# Patient Record
Sex: Male | Born: 1970 | Race: Black or African American | Hispanic: No | Marital: Single | Smoking: Former smoker
Health system: Southern US, Community
[De-identification: ages and names within clinical notes are randomized; demographics above are authoritative.]

## PROBLEM LIST (undated history)

## (undated) DIAGNOSIS — I472 Ventricular tachycardia, unspecified: Secondary | ICD-10-CM

## (undated) DIAGNOSIS — E785 Hyperlipidemia, unspecified: Secondary | ICD-10-CM

## (undated) DIAGNOSIS — F419 Anxiety disorder, unspecified: Secondary | ICD-10-CM

## (undated) DIAGNOSIS — I25810 Atherosclerosis of coronary artery bypass graft(s) without angina pectoris: Secondary | ICD-10-CM

## (undated) DIAGNOSIS — Z720 Tobacco use: Secondary | ICD-10-CM

## (undated) DIAGNOSIS — E119 Type 2 diabetes mellitus without complications: Secondary | ICD-10-CM

## (undated) DIAGNOSIS — I255 Ischemic cardiomyopathy: Secondary | ICD-10-CM

## (undated) HISTORY — DX: Hyperlipidemia, unspecified: E78.5

## (undated) HISTORY — DX: Anxiety disorder, unspecified: F41.9

## (undated) HISTORY — DX: Ventricular tachycardia: I47.2

## (undated) HISTORY — DX: Type 2 diabetes mellitus without complications: E11.9

## (undated) HISTORY — DX: Ischemic cardiomyopathy: I25.5

## (undated) HISTORY — DX: Ventricular tachycardia, unspecified: I47.20

## (undated) HISTORY — PX: SHOULDER SURGERY: SHX246

## (undated) HISTORY — DX: Atherosclerosis of coronary artery bypass graft(s) without angina pectoris: I25.810

## (undated) HISTORY — PX: WRIST ARTHROPLASTY: SHX1088

---

## 2004-09-02 ENCOUNTER — Emergency Department (HOSPITAL_COMMUNITY): Admission: EM | Admit: 2004-09-02 | Discharge: 2004-09-02 | Payer: Self-pay | Admitting: Family Medicine

## 2006-11-12 ENCOUNTER — Emergency Department (HOSPITAL_COMMUNITY): Admission: EM | Admit: 2006-11-12 | Discharge: 2006-11-13 | Payer: Self-pay | Admitting: Emergency Medicine

## 2006-11-16 ENCOUNTER — Encounter: Admission: RE | Admit: 2006-11-16 | Discharge: 2006-11-16 | Payer: Self-pay | Admitting: Orthopedic Surgery

## 2006-11-25 ENCOUNTER — Encounter: Admission: RE | Admit: 2006-11-25 | Discharge: 2007-02-23 | Payer: Self-pay | Admitting: Orthopedic Surgery

## 2015-08-25 ENCOUNTER — Other Ambulatory Visit: Payer: Self-pay | Admitting: Family Medicine

## 2015-08-25 ENCOUNTER — Ambulatory Visit
Admission: RE | Admit: 2015-08-25 | Discharge: 2015-08-25 | Disposition: A | Discharge status: Home / Self Care | Payer: Worker's Compensation | Source: Ambulatory Visit | Attending: Family Medicine | Admitting: Family Medicine

## 2015-08-25 DIAGNOSIS — M25531 Pain in right wrist: Secondary | ICD-10-CM

## 2018-01-03 ENCOUNTER — Inpatient Hospital Stay (HOSPITAL_COMMUNITY)
Admission: EM | Admit: 2018-01-03 | Discharge: 2018-01-16 | DRG: 233 | Disposition: A | Discharge status: Home Health | Payer: Self-pay | Attending: Thoracic Surgery (Cardiothoracic Vascular Surgery) | Admitting: Thoracic Surgery (Cardiothoracic Vascular Surgery)

## 2018-01-03 ENCOUNTER — Inpatient Hospital Stay (HOSPITAL_COMMUNITY)
Admission: EM | Disposition: A | Discharge status: Home Health | Payer: Self-pay | Source: Home / Self Care | Attending: Thoracic Surgery (Cardiothoracic Vascular Surgery)

## 2018-01-03 ENCOUNTER — Other Ambulatory Visit: Payer: Self-pay | Admitting: *Deleted

## 2018-01-03 ENCOUNTER — Other Ambulatory Visit: Payer: Self-pay

## 2018-01-03 ENCOUNTER — Encounter (HOSPITAL_COMMUNITY): Payer: Self-pay | Admitting: *Deleted

## 2018-01-03 ENCOUNTER — Emergency Department (HOSPITAL_COMMUNITY): Payer: Self-pay

## 2018-01-03 DIAGNOSIS — R509 Fever, unspecified: Secondary | ICD-10-CM | POA: Diagnosis present

## 2018-01-03 DIAGNOSIS — I5021 Acute systolic (congestive) heart failure: Secondary | ICD-10-CM | POA: Diagnosis not present

## 2018-01-03 DIAGNOSIS — R740 Nonspecific elevation of levels of transaminase and lactic acid dehydrogenase [LDH]: Secondary | ICD-10-CM | POA: Diagnosis present

## 2018-01-03 DIAGNOSIS — I214 Non-ST elevation (NSTEMI) myocardial infarction: Principal | ICD-10-CM | POA: Diagnosis present

## 2018-01-03 DIAGNOSIS — Z6832 Body mass index (BMI) 32.0-32.9, adult: Secondary | ICD-10-CM

## 2018-01-03 DIAGNOSIS — E876 Hypokalemia: Secondary | ICD-10-CM | POA: Diagnosis not present

## 2018-01-03 DIAGNOSIS — Z951 Presence of aortocoronary bypass graft: Secondary | ICD-10-CM

## 2018-01-03 DIAGNOSIS — D62 Acute posthemorrhagic anemia: Secondary | ICD-10-CM | POA: Diagnosis not present

## 2018-01-03 DIAGNOSIS — I25110 Atherosclerotic heart disease of native coronary artery with unstable angina pectoris: Secondary | ICD-10-CM

## 2018-01-03 DIAGNOSIS — I2510 Atherosclerotic heart disease of native coronary artery without angina pectoris: Secondary | ICD-10-CM | POA: Insufficient documentation

## 2018-01-03 DIAGNOSIS — E785 Hyperlipidemia, unspecified: Secondary | ICD-10-CM | POA: Diagnosis present

## 2018-01-03 DIAGNOSIS — F17290 Nicotine dependence, other tobacco product, uncomplicated: Secondary | ICD-10-CM | POA: Diagnosis present

## 2018-01-03 DIAGNOSIS — I4901 Ventricular fibrillation: Secondary | ICD-10-CM | POA: Diagnosis not present

## 2018-01-03 DIAGNOSIS — Z96631 Presence of right artificial wrist joint: Secondary | ICD-10-CM | POA: Diagnosis present

## 2018-01-03 DIAGNOSIS — E669 Obesity, unspecified: Secondary | ICD-10-CM | POA: Diagnosis present

## 2018-01-03 DIAGNOSIS — I255 Ischemic cardiomyopathy: Secondary | ICD-10-CM | POA: Diagnosis present

## 2018-01-03 DIAGNOSIS — Z823 Family history of stroke: Secondary | ICD-10-CM

## 2018-01-03 DIAGNOSIS — E1165 Type 2 diabetes mellitus with hyperglycemia: Secondary | ICD-10-CM | POA: Diagnosis present

## 2018-01-03 DIAGNOSIS — I462 Cardiac arrest due to underlying cardiac condition: Secondary | ICD-10-CM | POA: Diagnosis not present

## 2018-01-03 DIAGNOSIS — I4510 Unspecified right bundle-branch block: Secondary | ICD-10-CM | POA: Diagnosis not present

## 2018-01-03 DIAGNOSIS — Z09 Encounter for follow-up examination after completed treatment for conditions other than malignant neoplasm: Secondary | ICD-10-CM

## 2018-01-03 HISTORY — PX: LEFT HEART CATH AND CORONARY ANGIOGRAPHY: CATH118249

## 2018-01-03 HISTORY — DX: Tobacco use: Z72.0

## 2018-01-03 LAB — TROPONIN I
TROPONIN I: 10.23 ng/mL — AB (ref <0.03)
Troponin I: 53.62 ng/mL (ref <0.03)

## 2018-01-03 LAB — COMPREHENSIVE METABOLIC PANEL
ALT: 35 U/L (ref 17–63)
AST: 67 U/L — ABNORMAL HIGH (ref 15–41)
Albumin: 3.6 g/dL (ref 3.5–5.0)
Alkaline Phosphatase: 107 U/L (ref 38–126)
Anion gap: 10 (ref 5–15)
BUN: 8 mg/dL (ref 6–20)
CO2: 22 mmol/L (ref 22–32)
Calcium: 8.7 mg/dL — ABNORMAL LOW (ref 8.9–10.3)
Chloride: 101 mmol/L (ref 101–111)
Creatinine, Ser: 0.86 mg/dL (ref 0.61–1.24)
GFR calc Af Amer: >60 mL/min (ref >60)
GFR calc non Af Amer: >60 mL/min (ref >60)
Glucose, Bld: 343 mg/dL — ABNORMAL HIGH (ref 65–99)
Potassium: 4 mmol/L (ref 3.5–5.1)
Sodium: 133 mmol/L — ABNORMAL LOW (ref 135–145)
Total Bilirubin: 0.8 mg/dL (ref 0.3–1.2)
Total Protein: 6.7 g/dL (ref 6.5–8.1)

## 2018-01-03 LAB — HEMOGLOBIN A1C
Hgb A1c MFr Bld: 12.7 % — ABNORMAL HIGH (ref 4.8–5.6)
Mean Plasma Glucose: 317.79 mg/dL

## 2018-01-03 LAB — CBC
HCT: 40.5 % (ref 39.0–52.0)
Hemoglobin: 13.5 g/dL (ref 13.0–17.0)
MCH: 26.1 pg (ref 26.0–34.0)
MCHC: 33.3 g/dL (ref 30.0–36.0)
MCV: 78.3 fL (ref 78.0–100.0)
Platelets: 215 10*3/uL (ref 150–400)
RBC: 5.17 MIL/uL (ref 4.22–5.81)
RDW: 13.3 % (ref 11.5–15.5)
WBC: 4.7 10*3/uL (ref 4.0–10.5)

## 2018-01-03 LAB — TSH: TSH: 0.867 u[IU]/mL (ref 0.350–4.500)

## 2018-01-03 LAB — MRSA PCR SCREENING: MRSA by PCR: NEGATIVE

## 2018-01-03 LAB — GLUCOSE, CAPILLARY
Glucose-Capillary: 279 mg/dL — ABNORMAL HIGH (ref 65–99)
Glucose-Capillary: 280 mg/dL — ABNORMAL HIGH (ref 65–99)

## 2018-01-03 LAB — I-STAT TROPONIN, ED: Troponin i, poc: 3.13 ng/mL (ref 0.00–0.08)

## 2018-01-03 LAB — CBG MONITORING, ED: Glucose-Capillary: 343 mg/dL — ABNORMAL HIGH (ref 65–99)

## 2018-01-03 LAB — PROTIME-INR
INR: 1.15
PROTHROMBIN TIME: 14.6 s (ref 11.4–15.2)

## 2018-01-03 SURGERY — LEFT HEART CATH AND CORONARY ANGIOGRAPHY
Anesthesia: LOCAL

## 2018-01-03 MED ORDER — SODIUM CHLORIDE 0.9 % IV BOLUS
1000.0000 mL | Freq: Once | INTRAVENOUS | Status: AC
  Administered 2018-01-03: 1000 mL via INTRAVENOUS

## 2018-01-03 MED ORDER — LIDOCAINE HCL (PF) 1 % IJ SOLN
INTRAMUSCULAR | Status: DC | PRN
  Administered 2018-01-03: 2 mL via INTRADERMAL

## 2018-01-03 MED ORDER — INSULIN ASPART 100 UNIT/ML ~~LOC~~ SOLN
0.0000 [IU] | Freq: Three times a day (TID) | SUBCUTANEOUS | Status: DC
  Administered 2018-01-03 – 2018-01-04 (×2): 5 [IU] via SUBCUTANEOUS
  Administered 2018-01-04: 3 [IU] via SUBCUTANEOUS
  Administered 2018-01-04: 5 [IU] via SUBCUTANEOUS
  Administered 2018-01-05 (×2): 3 [IU] via SUBCUTANEOUS
  Administered 2018-01-05: 2 [IU] via SUBCUTANEOUS
  Administered 2018-01-06: 3 [IU] via SUBCUTANEOUS
  Administered 2018-01-06 (×2): 2 [IU] via SUBCUTANEOUS
  Administered 2018-01-07: 1 [IU] via SUBCUTANEOUS
  Administered 2018-01-07: 3 [IU] via SUBCUTANEOUS
  Administered 2018-01-07: 2 [IU] via SUBCUTANEOUS

## 2018-01-03 MED ORDER — METOPROLOL TARTRATE 12.5 MG HALF TABLET
12.5000 mg | ORAL_TABLET | Freq: Two times a day (BID) | ORAL | Status: DC
  Administered 2018-01-03 – 2018-01-04 (×2): 12.5 mg via ORAL
  Filled 2018-01-03 (×2): qty 1

## 2018-01-03 MED ORDER — NITROGLYCERIN 0.4 MG SL SUBL
0.4000 mg | SUBLINGUAL_TABLET | SUBLINGUAL | Status: DC | PRN
  Administered 2018-01-03: 0.4 mg via SUBLINGUAL
  Filled 2018-01-03: qty 1

## 2018-01-03 MED ORDER — SODIUM CHLORIDE 0.9% FLUSH: 3.0000 mL | Freq: Two times a day (BID) | INTRAVENOUS | Status: DC

## 2018-01-03 MED ORDER — ONDANSETRON HCL 4 MG/2ML IJ SOLN: 4.0000 mg | Freq: Four times a day (QID) | INTRAMUSCULAR | Status: DC | PRN

## 2018-01-03 MED ORDER — THE SENSUOUS HEART BOOK
Freq: Once | Status: AC
  Administered 2018-01-03: 1
  Filled 2018-01-03: qty 1

## 2018-01-03 MED ORDER — SODIUM CHLORIDE 0.9 % IV SOLN: 250.0000 mL | INTRAVENOUS | Status: DC | PRN

## 2018-01-03 MED ORDER — HEPARIN SODIUM (PORCINE) 1000 UNIT/ML IJ SOLN
INTRAMUSCULAR | Status: AC
Start: 2018-01-03 — End: 2018-01-03
  Filled 2018-01-03: qty 1

## 2018-01-03 MED ORDER — SODIUM CHLORIDE 0.9% FLUSH: 3.0000 mL | INTRAVENOUS | Status: DC | PRN

## 2018-01-03 MED ORDER — IOPAMIDOL (ISOVUE-370) INJECTION 76%
INTRAVENOUS | Status: AC
  Filled 2018-01-03: qty 100

## 2018-01-03 MED ORDER — VERAPAMIL HCL 2.5 MG/ML IV SOLN
INTRAVENOUS | Status: AC
  Filled 2018-01-03: qty 2

## 2018-01-03 MED ORDER — INSULIN ASPART 100 UNIT/ML ~~LOC~~ SOLN: 0.0000 [IU] | Freq: Three times a day (TID) | SUBCUTANEOUS | Status: DC

## 2018-01-03 MED ORDER — NITROGLYCERIN IN D5W 200-5 MCG/ML-% IV SOLN
INTRAVENOUS | Status: AC
  Filled 2018-01-03: qty 250

## 2018-01-03 MED ORDER — FENTANYL CITRATE (PF) 100 MCG/2ML IJ SOLN
INTRAMUSCULAR | Status: DC | PRN
  Administered 2018-01-03: 25 ug via INTRAVENOUS

## 2018-01-03 MED ORDER — INSULIN ASPART 100 UNIT/ML ~~LOC~~ SOLN
0.0000 [IU] | Freq: Every day | SUBCUTANEOUS | Status: DC
  Administered 2018-01-03: 3 [IU] via SUBCUTANEOUS
  Administered 2018-01-04 – 2018-01-06 (×2): 2 [IU] via SUBCUTANEOUS

## 2018-01-03 MED ORDER — HEPARIN SODIUM (PORCINE) 1000 UNIT/ML IJ SOLN
INTRAMUSCULAR | Status: DC | PRN
  Administered 2018-01-03: 5000 [IU] via INTRAVENOUS

## 2018-01-03 MED ORDER — IOPAMIDOL (ISOVUE-370) INJECTION 76%
INTRAVENOUS | Status: DC | PRN
  Administered 2018-01-03: 85 mL via INTRA_ARTERIAL

## 2018-01-03 MED ORDER — ASPIRIN 81 MG PO CHEW
81.0000 mg | CHEWABLE_TABLET | Freq: Every day | ORAL | Status: DC
  Administered 2018-01-04 – 2018-01-07 (×4): 81 mg via ORAL
  Filled 2018-01-03 (×4): qty 1

## 2018-01-03 MED ORDER — SODIUM CHLORIDE 0.9% FLUSH
3.0000 mL | Freq: Two times a day (BID) | INTRAVENOUS | Status: DC
  Administered 2018-01-03 – 2018-01-07 (×6): 3 mL via INTRAVENOUS

## 2018-01-03 MED ORDER — HEPARIN (PORCINE) IN NACL 100-0.45 UNIT/ML-% IJ SOLN
1350.0000 [IU]/h | INTRAMUSCULAR | Status: DC
  Administered 2018-01-03: 1350 [IU]/h via INTRAVENOUS
  Filled 2018-01-03: qty 250

## 2018-01-03 MED ORDER — ASPIRIN EC 81 MG PO TBEC: 81.0000 mg | DELAYED_RELEASE_TABLET | Freq: Every day | ORAL | Status: DC

## 2018-01-03 MED ORDER — VERAPAMIL HCL 2.5 MG/ML IV SOLN
INTRAVENOUS | Status: DC | PRN
  Administered 2018-01-03: 10 mL via INTRA_ARTERIAL

## 2018-01-03 MED ORDER — MIDAZOLAM HCL 2 MG/2ML IJ SOLN
INTRAMUSCULAR | Status: AC
  Filled 2018-01-03: qty 2

## 2018-01-03 MED ORDER — ACETAMINOPHEN 325 MG PO TABS: 650.0000 mg | ORAL_TABLET | ORAL | Status: DC | PRN

## 2018-01-03 MED ORDER — HEPARIN (PORCINE) IN NACL 1000-0.9 UT/500ML-% IV SOLN
INTRAVENOUS | Status: AC
  Filled 2018-01-03: qty 1000

## 2018-01-03 MED ORDER — HEART ATTACK BOUNCING BOOK
Freq: Once | Status: AC
Start: 2018-01-03 — End: 2018-01-03
  Administered 2018-01-03: 1
  Filled 2018-01-03: qty 1

## 2018-01-03 MED ORDER — LIDOCAINE HCL (PF) 1 % IJ SOLN
INTRAMUSCULAR | Status: AC
  Filled 2018-01-03: qty 30

## 2018-01-03 MED ORDER — NITROGLYCERIN IN D5W 200-5 MCG/ML-% IV SOLN
0.0000 ug/min | INTRAVENOUS | Status: DC
  Administered 2018-01-03: 5 ug/min via INTRAVENOUS

## 2018-01-03 MED ORDER — MIDAZOLAM HCL 2 MG/2ML IJ SOLN
INTRAMUSCULAR | Status: DC | PRN
  Administered 2018-01-03: 2 mg via INTRAVENOUS

## 2018-01-03 MED ORDER — SODIUM CHLORIDE 0.9 % IV SOLN: INTRAVENOUS | Status: DC

## 2018-01-03 MED ORDER — HEPARIN (PORCINE) IN NACL 2-0.9 UNITS/ML
INTRAMUSCULAR | Status: AC | PRN
  Administered 2018-01-03 (×2): 500 mL via INTRA_ARTERIAL

## 2018-01-03 MED ORDER — HEPARIN (PORCINE) IN NACL 100-0.45 UNIT/ML-% IJ SOLN
1650.0000 [IU]/h | INTRAMUSCULAR | Status: DC
  Administered 2018-01-03: 1350 [IU]/h via INTRAVENOUS
  Administered 2018-01-04: 1650 [IU]/h via INTRAVENOUS
  Filled 2018-01-03: qty 250

## 2018-01-03 MED ORDER — HEPARIN BOLUS VIA INFUSION
4000.0000 [IU] | Freq: Once | INTRAVENOUS | Status: AC
  Administered 2018-01-03: 4000 [IU] via INTRAVENOUS
  Filled 2018-01-03: qty 4000

## 2018-01-03 MED ORDER — ACETAMINOPHEN 325 MG PO TABS
650.0000 mg | ORAL_TABLET | ORAL | Status: DC | PRN
  Administered 2018-01-04 – 2018-01-07 (×4): 650 mg via ORAL
  Filled 2018-01-03 (×4): qty 2

## 2018-01-03 MED ORDER — FENTANYL CITRATE (PF) 100 MCG/2ML IJ SOLN
INTRAMUSCULAR | Status: AC
  Filled 2018-01-03: qty 2

## 2018-01-03 SURGICAL SUPPLY — 12 items

## 2018-01-03 NOTE — Interval H&P Note (Signed)
Cath Lab Visit (complete for each Cath Lab visit)  Clinical Evaluation Leading to the Procedure:   ACS: Yes.    Non-ACS:    Anginal Classification: CCS IV  Anti-ischemic medical therapy: Minimal Therapy (1 class of medications)  Non-Invasive Test Results: No non-invasive testing performed  Prior CABG: No previous CABG      History and Physical Interval Note:  01/03/2018 3:58 PM  Allen Cox  has presented today for surgery, with the diagnosis of Nstemi  The various methods of treatment have been discussed with the patient and family. After consideration of risks, benefits and other options for treatment, the patient has consented to  Procedure(s): LEFT HEART CATH AND CORONARY ANGIOGRAPHY (N/A) as a surgical intervention .  The patient's history has been reviewed, patient examined, no change in status, stable for surgery.  I have reviewed the patient's chart and labs.  Questions were answered to the patient's satisfaction.     Lance Muss

## 2018-01-03 NOTE — Progress Notes (Signed)
ANTICOAGULATION CONSULT NOTE - Initial Consult  Pharmacy Consult for Heparin  Indication: chest pain/ACS  No Known Allergies  Patient Measurements: Height: 6' (182.9 cm) Weight: 240 lb (108.9 kg) IBW/kg (Calculated) : 77.6 Heparin Dosing Weight: 104  Vital Signs: Temp: 98.5 F (36.9 C) (05/03 1649) Temp Source: Oral (05/03 1649) BP: 99/67 (05/03 1625) Pulse Rate: 83 (05/03 1649)  Labs: Recent Labs    01/03/18 1341  HGB 13.5  HCT 40.5  PLT 215  CREATININE 0.86    Estimated Creatinine Clearance: 133.9 mL/min (by C-G formula based on SCr of 0.86 mg/dL).   Assessment: 47 yo male admitted for Chest pain. Elevated initial troponin now status post LHC  Sheath removal ~ 1630  Cbc stable  To resume heparin 8 hrs after pull No levels on previous heparin before stopping for cath  Goal of Therapy:  Heparin level 0.3-0.7 units/ml Monitor platelets by anticoagulation protocol: Yes   Plan:  Resume heparin 1350 units/hr @ 0030 Sat AM Next hep lvl 0630 Daily hep lvl cbc  Isaac Bliss, PharmD, BCPS, BCCCP Clinical Pharmacist Clinical phone for 01/03/2018 from 1430 [phone]: [phone] If after 2300, please call main pharmacy at: [phone] 01/03/2018 4:51 PM

## 2018-01-03 NOTE — ED Notes (Signed)
Cardiology at bedside.

## 2018-01-03 NOTE — ED Notes (Signed)
Pt rcvd 324 mg ASA pta 
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
## 2018-01-03 NOTE — Progress Notes (Signed)
ANTICOAGULATION CONSULT NOTE - Initial Consult  Pharmacy Consult for Heparin  Indication: chest pain/ACS  No Known Allergies  Patient Measurements: Weight: 240 lb (108.9 kg) Heparin Dosing Weight: 104  Vital Signs: Temp: 98.5 F (36.9 C) (05/03 1230) Temp Source: Oral (05/03 1230) BP: 101/74 (05/03 1231) Pulse Rate: 83 (05/03 1230)  Labs: Recent Labs    01/03/18 1341  HGB 13.5  HCT 40.5  PLT 215    CrCl cannot be calculated (No order found.).   Medical History: History reviewed. No pertinent past medical history.  Assessment: 47 yo male admitted for Chest pain. Elevated initial troponin. Starting heparin therapy.  Goal of Therapy:  Heparin level 0.3-0.7 units/ml Monitor platelets by anticoagulation protocol: Yes   Plan:  Give 4000 units bolus x 1 Start heparin infusion at 1350 units/hr Check anti-Xa level in 6 hours and daily while on heparin Continue to monitor H&H and platelets  Tera Mater, PharmD 01/03/2018,2:05 PM

## 2018-01-03 NOTE — Significant Event (Signed)
Cardiology Cross Cover Note  Called to assess patient given recurrent chest pain.  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  Repeat EKG shows continued anteroseptal Q waves with evolving anterior MI changes. After a sublingual nitroglycerin his pain resolved largely.  Phone and later returned at 50.  He appears euvolemic on exam is laying flat and appears comfortable in bed.  I discussed his case with interventional on-call. Our plan moving forward will be low-dose nitroglycerin systemic infusion, restarting systemic acute coronary syndrome heparin infusion, and very low-dose oral beta-blocker given heart rate over 100.  Should he worsen clinically we will have a very low threshold to place a balloon pump.  Cleta Alberts, MD 01/03/2018, 11:27 PM
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
## 2018-01-03 NOTE — Progress Notes (Signed)
Pt c/o chest discomfort 3/10. V/S stable. EKG done. Dr Means informed. Ntg 0.4 mg SL x1dose given as PRN dose. Rechecked after 5 mins. Pt verbalized chest pain free. Will continue to monitor pt.

## 2018-01-03 NOTE — Progress Notes (Signed)
Patient admitted to 6E05 from Cath Lab via bed.  Bed in low position, wheels locked.  Patient denies chest pain/shortness of breath.  Portable telemetry monitor applied.  Patient oriented to environment, including call bell, TV, meal times, and hourly rounding. TR band to right radial with 10cc of air.

## 2018-01-04 ENCOUNTER — Inpatient Hospital Stay (HOSPITAL_COMMUNITY): Payer: Self-pay

## 2018-01-04 DIAGNOSIS — I340 Nonrheumatic mitral (valve) insufficiency: Secondary | ICD-10-CM

## 2018-01-04 DIAGNOSIS — Z01810 Encounter for preprocedural cardiovascular examination: Secondary | ICD-10-CM

## 2018-01-04 LAB — BASIC METABOLIC PANEL
ANION GAP: 10 (ref 5–15)
BUN: 5 mg/dL — ABNORMAL LOW (ref 6–20)
CHLORIDE: 102 mmol/L (ref 101–111)
CO2: 21 mmol/L — AB (ref 22–32)
Calcium: 8.7 mg/dL — ABNORMAL LOW (ref 8.9–10.3)
Creatinine, Ser: 0.89 mg/dL (ref 0.61–1.24)
GFR calc Af Amer: >60 mL/min (ref >60)
GFR calc non Af Amer: >60 mL/min (ref >60)
GLUCOSE: 334 mg/dL — AB (ref 65–99)
Potassium: 3.6 mmol/L (ref 3.5–5.1)
Sodium: 133 mmol/L — ABNORMAL LOW (ref 135–145)

## 2018-01-04 LAB — HIV ANTIBODY (ROUTINE TESTING W REFLEX): HIV SCREEN 4TH GENERATION: NONREACTIVE

## 2018-01-04 LAB — HEPARIN LEVEL (UNFRACTIONATED): Heparin Unfractionated: 0.21 IU/mL — ABNORMAL LOW (ref 0.30–0.70)

## 2018-01-04 LAB — HEPATIC FUNCTION PANEL
ALT: 54 U/L (ref 17–63)
AST: 191 U/L — ABNORMAL HIGH (ref 15–41)
Albumin: 3.6 g/dL (ref 3.5–5.0)
Alkaline Phosphatase: 102 U/L (ref 38–126)
BILIRUBIN DIRECT: 0.1 mg/dL (ref 0.1–0.5)
BILIRUBIN INDIRECT: 0.7 mg/dL (ref 0.3–0.9)
TOTAL PROTEIN: 6.9 g/dL (ref 6.5–8.1)
Total Bilirubin: 0.8 mg/dL (ref 0.3–1.2)

## 2018-01-04 LAB — TROPONIN I: TROPONIN I: 54.25 ng/mL — AB (ref <0.03)

## 2018-01-04 LAB — CBC
HEMATOCRIT: 40.4 % (ref 39.0–52.0)
HEMOGLOBIN: 13.5 g/dL (ref 13.0–17.0)
MCH: 26.2 pg (ref 26.0–34.0)
MCHC: 33.4 g/dL (ref 30.0–36.0)
MCV: 78.3 fL (ref 78.0–100.0)
Platelets: 229 10*3/uL (ref 150–400)
RBC: 5.16 MIL/uL (ref 4.22–5.81)
RDW: 13.5 % (ref 11.5–15.5)
WBC: 9.2 10*3/uL (ref 4.0–10.5)

## 2018-01-04 LAB — LIPID PANEL
CHOL/HDL RATIO: 5.6 ratio
Cholesterol: 214 mg/dL — ABNORMAL HIGH (ref 0–200)
HDL: 38 mg/dL — AB (ref >40)
LDL CALC: 157 mg/dL — AB (ref 0–99)
Triglycerides: 96 mg/dL (ref <150)
VLDL: 19 mg/dL (ref 0–40)

## 2018-01-04 LAB — ECHOCARDIOGRAM COMPLETE
Height: 72 in
WEIGHTICAEL: 3833.6 [oz_av]

## 2018-01-04 LAB — GLUCOSE, CAPILLARY
GLUCOSE-CAPILLARY: 292 mg/dL — AB (ref 65–99)
GLUCOSE-CAPILLARY: 243 mg/dL — AB (ref 65–99)
GLUCOSE-CAPILLARY: 227 mg/dL — AB (ref 65–99)
Glucose-Capillary: 254 mg/dL — ABNORMAL HIGH (ref 65–99)

## 2018-01-04 MED ORDER — LIVING WELL WITH DIABETES BOOK
Freq: Once | Status: AC
  Administered 2018-01-04: 10:00:00
  Filled 2018-01-04: qty 1

## 2018-01-04 MED ORDER — METOPROLOL TARTRATE 25 MG PO TABS
25.0000 mg | ORAL_TABLET | Freq: Two times a day (BID) | ORAL | Status: DC
  Administered 2018-01-05 – 2018-01-07 (×6): 25 mg via ORAL
  Filled 2018-01-04 (×7): qty 1

## 2018-01-04 MED ORDER — HEPARIN BOLUS VIA INFUSION
1500.0000 [IU] | Freq: Once | INTRAVENOUS | Status: AC
  Administered 2018-01-04: 1500 [IU] via INTRAVENOUS
  Filled 2018-01-04: qty 1500

## 2018-01-04 MED ORDER — HEPARIN (PORCINE) IN NACL 100-0.45 UNIT/ML-% IJ SOLN
2300.0000 [IU]/h | INTRAMUSCULAR | Status: DC
  Administered 2018-01-05: 2000 [IU]/h via INTRAVENOUS
  Administered 2018-01-06: 2300 [IU]/h via INTRAVENOUS
  Administered 2018-01-06: 2150 [IU]/h via INTRAVENOUS
  Administered 2018-01-07 – 2018-01-08 (×3): 2300 [IU]/h via INTRAVENOUS
  Filled 2018-01-04 (×7): qty 250

## 2018-01-04 MED ORDER — LIVING WELL WITH DIABETES BOOK
Freq: Once | Status: AC
  Filled 2018-01-04: qty 1

## 2018-01-04 MED ORDER — HEPARIN BOLUS VIA INFUSION
3000.0000 [IU] | Freq: Once | INTRAVENOUS | Status: AC
  Administered 2018-01-04: 3000 [IU] via INTRAVENOUS
  Filled 2018-01-04: qty 3000

## 2018-01-04 NOTE — Progress Notes (Signed)
Progress Note   Subjective   Doing well today.  Chest pain is currently 0/10.  No new concerns.  Inpatient Medications    Scheduled Meds: . aspirin  81 mg Oral Daily  . insulin aspart  0-5 Units Subcutaneous QHS  . insulin aspart  0-9 Units Subcutaneous TID WC  . metoprolol tartrate  25 mg Oral BID  . sodium chloride flush  3 mL Intravenous Q12H   Continuous Infusions: . sodium chloride    . heparin 1,650 Units/hr (01/04/18 1007)  . nitroGLYCERIN 5 mcg/min (01/04/18 0500)   PRN Meds: sodium chloride, acetaminophen, nitroGLYCERIN, ondansetron (ZOFRAN) IV, sodium chloride flush   Vital Signs    Vitals:   01/04/18 0404 01/04/18 0448 01/04/18 0755 01/04/18 0814  BP: 111/70  108/70   Pulse:   (!) 104 94  Resp:      Temp: (!) 100.6 F (38.1 C)  99.1 F (37.3 C)   TempSrc: Oral  Oral   SpO2: 99%  98%   Weight:  239 lb 9.6 oz (108.7 kg)    Height:        Intake/Output Summary (Last 24 hours) at 01/04/2018 1145 Last data filed at 01/04/2018 1007 Gross per 24 hour  Intake 826.48 ml  Output 600 ml  Net 226.48 ml   Filed Weights   01/03/18 1400 01/03/18 1519 01/04/18 0448  Weight: 240 lb (108.9 kg) 240 lb (108.9 kg) 239 lb 9.6 oz (108.7 kg)    Telemetry    Sinus tachycardia - Personally Reviewed  Physical Exam   GEN- The patient is well appearing, alert and oriented x 3 today.   Head- normocephalic, atraumatic Eyes-  Sclera clear, conjunctiva pink Ears- hearing intact Oropharynx- clear Neck- supple, Lungs- Clear to ausculation bilaterally, normal work of breathing Heart- Regular rate and rhythm  GI- soft, NT, ND, + BS Extremities- no clubbing, cyanosis, or edema  MS- no significant deformity or atrophy Skin- no rash or lesion Psych- euthymic mood, full affect Neuro- strength and sensation are intact   Labs    Chemistry Recent Labs  Lab 01/03/18 1341 01/04/18 0433  NA 133* 133*  K 4.0 3.6  CL 101 102  CO2 22 21*  GLUCOSE 343* 334*  BUN 8 5*    CREATININE 0.86 0.89  CALCIUM 8.7* 8.7*  PROT 6.7 6.9  ALBUMIN 3.6 3.6  AST 67* 191*  ALT 35 54  ALKPHOS 107 102  BILITOT 0.8 0.8  GFRNONAA >60 >60  GFRAA >60 >60  ANIONGAP 10 10     Hematology Recent Labs  Lab 01/03/18 1341 01/04/18 0433  WBC 4.7 9.2  RBC 5.17 5.16  HGB 13.5 13.5  HCT 40.5 40.4  MCV 78.3 78.3  MCH 26.1 26.2  MCHC 33.3 33.4  RDW 13.3 13.5  PLT 215 229    Cardiac Enzymes Recent Labs  Lab 01/03/18 1525 01/03/18 2153 01/04/18 0433  TROPONINI 10.23* 53.62* 54.25*    Recent Labs  Lab 01/03/18 1329  TROPIPOC 3.13*        Assessment & Plan    1.  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 Dr Dorris Fetch has planned for CABG on Wednesday. Will continue IV heparin and nitroglycerin for now. Titrate metoprolol as bp allows Echo pending  2. Diabetes Diabetes management team consulted  3. Overweight Body mass index is 32.5 kg/m. Lifestyle modification will be beneficial  4. Low grade fever Likely from MI No symptoms to localize Blood cultures have  been ordered  Hillis Range MD, [facility] 01/04/2018 11:45 AM

## 2018-01-04 NOTE — Progress Notes (Signed)
Pt c/o feeling warm. Took out some of patient's blanket. Rechecked Temp=101.8. Tylenol 650 mg po given. Dr Means informed & ordered urine culture & blood culture. Will monitor pt.

## 2018-01-04 NOTE — Progress Notes (Signed)
  Echocardiogram 2D Echocardiogram has been performed.  Allen Cox 01/04/2018, 11:18 AM

## 2018-01-04 NOTE — Progress Notes (Signed)
ANTICOAGULATION CONSULT NOTE   Pharmacy Consult for Heparin  Indication: chest pain/ACS  No Known Allergies  Patient Measurements: Height: 6' (182.9 cm) Weight: 239 lb 9.6 oz (108.7 kg) IBW/kg (Calculated) : 77.6 Heparin Dosing Weight: 100.5 kg  Vital Signs: Temp: 100.5 F (38.1 C) (05/04 1627) Temp Source: Oral (05/04 1627) BP: 102/65 (05/04 1627) Pulse Rate: 94 (05/04 0814)  Labs: Recent Labs    01/03/18 1341 01/03/18 1525 01/03/18 1610 01/03/18 2153 01/04/18 0433 01/04/18 0740 01/04/18 1533  HGB 13.5  --   --   --  13.5  --   --   HCT 40.5  --   --   --  40.4  --   --   PLT 215  --   --   --  229  --   --   LABPROT  --   --  14.6  --   --   --   --   INR  --   --  1.15  --   --   --   --   HEPARINUNFRC  --   --   --   --  <0.10* <0.10* 0.21*  CREATININE 0.86  --   --   --  0.89  --   --   TROPONINI  --  10.23*  --  53.62* 54.25*  --   --     Estimated Creatinine Clearance: 129.2 mL/min (by C-G formula based on SCr of 0.89 mg/dL).   Assessment: 47 yo male admitted for Chest pain. Elevated initial troponin now status post LHC. Heparin resumed 8 hours post sheath removal at 1350 units/hour.  Heparin level this morning was undetectable. Patient has 3V CAD  with plans for CABG on 5/8. CBC stable. Heparin rate was increased to 1650 units/hr and 3000 unit bolus given.  HL now = 0.21 on 1650 unit/hr No bleeding reported. No issues with IV site/infusion per RN.    Goal of Therapy:  Heparin level 0.3-0.7 units/ml Monitor platelets by anticoagulation protocol: Yes   Plan:  Heparin 1500 unit bolus x 1 Increase heparin gtt to 1850 units/hour Check heparin level in 6 hours Daily heparin level and CBC  Noah Delaine, RPh Clinical Pharmacist Pager: [phone] 8A-4P [phone] 4P-10P #[id number] Main Pharmacy [phone] 01/04/2018 4:58 PM

## 2018-01-04 NOTE — Progress Notes (Addendum)
ANTICOAGULATION CONSULT NOTE   Pharmacy Consult for Heparin  Indication: chest pain/ACS  No Known Allergies  Patient Measurements: Height: 6' (182.9 cm) Weight: 239 lb 9.6 oz (108.7 kg) IBW/kg (Calculated) : 77.6 Heparin Dosing Weight: 104 kg  Vital Signs: Temp: 99.1 F (37.3 C) (05/04 0755) Temp Source: Oral (05/04 0755) BP: 108/70 (05/04 0755) Pulse Rate: 94 (05/04 0814)  Labs: Recent Labs    01/03/18 1341 01/03/18 1525 01/03/18 1610 01/03/18 2153 01/04/18 0433 01/04/18 0740  HGB 13.5  --   --   --  13.5  --   HCT 40.5  --   --   --  40.4  --   PLT 215  --   --   --  229  --   LABPROT  --   --  14.6  --   --   --   INR  --   --  1.15  --   --   --   HEPARINUNFRC  --   --   --   --  <0.10* <0.10*  CREATININE 0.86  --   --   --  0.89  --   TROPONINI  --  10.23*  --  53.62* 54.25*  --     Estimated Creatinine Clearance: 129.2 mL/min (by C-G formula based on SCr of 0.89 mg/dL).   Assessment: 47 yo male admitted for Chest pain. Elevated initial troponin now status post LHC. Heparin resumed 8 hours post sheath removal at 1350 units/hour. No prior heparin levels drawn. Heparin level this morning was undetectable. Patient has mvdz with plans for CABG on 5/8.  CBC stable, no bleeding reported, no issues or interruptions to IV lines per RN.    Goal of Therapy:  Heparin level 0.3-0.7 units/ml Monitor platelets by anticoagulation protocol: Yes   Plan:  Heparin 3000 unit bolus x 1 Increase heparin gtt to 1650 units/hour Check heparin level in 6 hours Daily heparin level and CBC  Adline Potter, PharmD Pharmacy Resident Pager: [phone]

## 2018-01-04 NOTE — Progress Notes (Signed)
Pre-op Cardiac Surgery  Carotid Findings:  1-39% ICA plaquing.  Vertebral artery flow is antegrade.   Upper Extremity Right Left  Brachial Pressures 110T 105T  Radial Waveforms T T  Ulnar Waveforms T T  Palmar Arch (Allen's Test) WNL Doppler signal obliterates with both radial and ulnar compression        Lower  Extremity Right Left  Dorsalis Pedis T T  Anterior Tibial    Posterior Tibial T T  Ankle/Brachial Indices

## 2018-01-04 NOTE — Progress Notes (Signed)
Inpatient Diabetes Program Recommendations  AACE/ADA: New Consensus Statement on Inpatient Glycemic Control (2015)  Target Ranges:  Prepandial:   less than 140 mg/dL      Peak postprandial:   less than 180 mg/dL (1-2 hours)      Critically ill patients:  140 - 180 mg/dL   Lab Results  Component Value Date   GLUCAP 254 (H) 01/04/2018   HGBA1C 12.7 (H) 01/03/2018    Review of Glycemic ControlResults for MEET, [place] (MRN [id number]) as of 01/04/2018 15:51  Ref. Range 01/03/2018 12:38 01/03/2018 17:05 01/03/2018 21:30 01/04/2018 08:02 01/04/2018 11:40  Glucose-Capillary Latest Ref Range: 65 - 99 mg/dL 409 (H) 811 (H) 914 (H) 292 (H) 254 (H)    Diabetes history: New diagnosis of DM Outpatient Diabetes medications: None Current orders for Inpatient glycemic control:  Novolog sensitive tid with meals and HS  Inpatient Diabetes Program Recommendations:    Note referral.  Diabetes Coordinator not on site this weekend.  Will see patient on 01/06/18.  Will order survival skill education by bedside RN.   Please consider also adding basal insulin, Lantus 20 units daily while in the hospital.    Thanks,  Beryl Meager, RN, BC-ADM Inpatient Diabetes Coordinator Pager [phone] (8a-5p)

## 2018-01-05 LAB — GLUCOSE, CAPILLARY
GLUCOSE-CAPILLARY: 211 mg/dL — AB (ref 65–99)
Glucose-Capillary: 226 mg/dL — ABNORMAL HIGH (ref 65–99)
Glucose-Capillary: 200 mg/dL — ABNORMAL HIGH (ref 65–99)
Glucose-Capillary: 190 mg/dL — ABNORMAL HIGH (ref 65–99)

## 2018-01-05 LAB — CBC
HCT: 41.3 % (ref 39.0–52.0)
HEMOGLOBIN: 13.5 g/dL (ref 13.0–17.0)
MCH: 25.9 pg — AB (ref 26.0–34.0)
MCHC: 32.7 g/dL (ref 30.0–36.0)
MCV: 79.1 fL (ref 78.0–100.0)
PLATELETS: 215 10*3/uL (ref 150–400)
RBC: 5.22 MIL/uL (ref 4.22–5.81)
RDW: 13.5 % (ref 11.5–15.5)
WBC: 7.1 10*3/uL (ref 4.0–10.5)

## 2018-01-05 LAB — URINE CULTURE: Special Requests: NORMAL

## 2018-01-05 LAB — HEPARIN LEVEL (UNFRACTIONATED)
Heparin Unfractionated: 0.22 IU/mL — ABNORMAL LOW (ref 0.30–0.70)
Heparin Unfractionated: 0.28 IU/mL — ABNORMAL LOW (ref 0.30–0.70)
Heparin Unfractionated: 0.33 IU/mL (ref 0.30–0.70)
Heparin Unfractionated: 0.34 IU/mL (ref 0.30–0.70)

## 2018-01-05 MED ORDER — INSULIN GLARGINE 100 UNIT/ML ~~LOC~~ SOLN
20.0000 [IU] | Freq: Every day | SUBCUTANEOUS | Status: DC
  Administered 2018-01-05 – 2018-01-07 (×3): 20 [IU] via SUBCUTANEOUS
  Filled 2018-01-05 (×4): qty 0.2

## 2018-01-05 MED ORDER — HEPARIN BOLUS VIA INFUSION
1000.0000 [IU] | Freq: Once | INTRAVENOUS | Status: AC
  Administered 2018-01-05: 1000 [IU] via INTRAVENOUS
  Filled 2018-01-05: qty 1000

## 2018-01-05 NOTE — Progress Notes (Signed)
ANTICOAGULATION CONSULT NOTE   Pharmacy Consult for Heparin  Indication: chest pain/ACS  No Known Allergies  Patient Measurements: Height: 6' (182.9 cm) Weight: 237 lb 6.4 oz (107.7 kg) IBW/kg (Calculated) : 77.6 Heparin Dosing Weight: 100.5 kg  Vital Signs: Temp: 99 F (37.2 C) (05/05 0733) Temp Source: Oral (05/05 0733) BP: 119/78 (05/05 0733) Pulse Rate: 99 (05/05 0918)  Labs: Recent Labs    01/03/18 1341 01/03/18 1525 01/03/18 1610 01/03/18 2153 01/04/18 0433  01/04/18 1533 01/05/18 0049 01/05/18 0820  HGB 13.5  --   --   --  13.5  --   --   --  13.5  HCT 40.5  --   --   --  40.4  --   --   --  41.3  PLT 215  --   --   --  229  --   --   --  215  LABPROT  --   --  14.6  --   --   --   --   --   --   INR  --   --  1.15  --   --   --   --   --   --   HEPARINUNFRC  --   --   --   --  <0.10*   < > 0.21* 0.28* 0.22*  CREATININE 0.86  --   --   --  0.89  --   --   --   --   TROPONINI  --  10.23*  --  53.62* 54.25*  --   --   --   --    < > = values in this interval not displayed.    Estimated Creatinine Clearance: 128.6 mL/min (by C-G formula based on SCr of 0.89 mg/dL).   Assessment: 47 yo male admitted for Chest pain. Elevated initial troponin now status post LHC. Patient has 3V CAD with plans for CABG on 5/8.  CBC stable. Heparin level still subtherapeutic this AM at 0.22 despite increases in heaprin dose/boluses. However, RN noticed a little kink in the IV line near the patient's infusion site which may/may not have impacted the infusion. She fixed and flushed the line so no further issues. Given the slow elevation in heparin level even when the IV line was functioning properly, will give a small bolus of heparin and increase the rate slightly to help reach heparin level goal.   Goal of Therapy:  Heparin level 0.3-0.7 units/ml Monitor platelets by anticoagulation protocol: Yes   Plan:  Heparin 1000 unit bolus x 1 Increase heparin gtt to 2100 units/hour Check  heparin level in 6 hours Daily heparin level and CBC  Adline Potter, PharmD Pharmacy Resident Pager: [phone]

## 2018-01-05 NOTE — Progress Notes (Signed)
      [address]       [place] [address]             [phone]      No CP or SOB  BP 119/78 (BP Location: Right Arm)   Pulse 99   Temp 99 F (37.2 C) (Oral)   Resp 20   Ht 6' (1.829 m)   Wt 237 lb 6.4 oz (107.7 kg)   SpO2 96%   BMI 32.20 kg/m   Exam A&O x 3 Cardiac RRR  Severe 3 VD s/p NSTEMI  He asked about alternatives to surgery. Discussed results of medical therapy v surgery in this setting  Viviann Spare C. Dorris Fetch, MD [facility] [phone]

## 2018-01-05 NOTE — Progress Notes (Signed)
ANTICOAGULATION CONSULT NOTE   Pharmacy Consult for Heparin  Indication: chest pain/ACS  No Known Allergies  Patient Measurements: Height: 6' (182.9 cm) Weight: 237 lb 6.4 oz (107.7 kg) IBW/kg (Calculated) : 77.6 Heparin Dosing Weight: 100.5 kg  Vital Signs: Temp: 99.5 F (37.5 C) (05/05 1547) Temp Source: Oral (05/05 1547) BP: 112/75 (05/05 2033) Pulse Rate: 101 (05/05 2033)  Labs: Recent Labs    01/03/18 1341 01/03/18 1525 01/03/18 1610 01/03/18 2153 01/04/18 0433  01/05/18 0820 01/05/18 1622 01/05/18 2314  HGB 13.5  --   --   --  13.5  --  13.5  --   --   HCT 40.5  --   --   --  40.4  --  41.3  --   --   PLT 215  --   --   --  229  --  215  --   --   LABPROT  --   --  14.6  --   --   --   --   --   --   INR  --   --  1.15  --   --   --   --   --   --   HEPARINUNFRC  --   --   --   --  <0.10*   < > 0.22* 0.34 0.33  CREATININE 0.86  --   --   --  0.89  --   --   --   --   TROPONINI  --  10.23*  --  53.62* 54.25*  --   --   --   --    < > = values in this interval not displayed.    Estimated Creatinine Clearance: 128.6 mL/min (by C-G formula based on SCr of 0.89 mg/dL).   Assessment: 47 yo male admitted for Chest pain. Elevated initial troponin now status post LHC. Patient has 3V CAD with plans for CABG on 5/8.  Heparin level = 0.33 on heparin 2150 units/hr. No bleeding noted.    Goal of Therapy:  Heparin level 0.3-0.7 units/ml Monitor platelets by anticoagulation protocol: Yes   Plan:  Continue heparin gtt at 2150 units/hour Daily heparin level and CBC  Talbert Cage, PharmD Clinical Pharmacist Pager: [phone] 01/05/2018 11:56 PM

## 2018-01-05 NOTE — Progress Notes (Signed)
ANTICOAGULATION CONSULT NOTE   Pharmacy Consult for Heparin  Indication: chest pain/ACS  No Known Allergies  Patient Measurements: Height: 6' (182.9 cm) Weight: 237 lb 6.4 oz (107.7 kg) IBW/kg (Calculated) : 77.6 Heparin Dosing Weight: 100.5 kg  Vital Signs: Temp: 99.5 F (37.5 C) (05/05 1547) Temp Source: Oral (05/05 1547) BP: 107/61 (05/05 1547) Pulse Rate: 99 (05/05 1547)  Labs: Recent Labs    01/03/18 1341 01/03/18 1525 01/03/18 1610 01/03/18 2153 01/04/18 0433  01/05/18 0049 01/05/18 0820 01/05/18 1622  HGB 13.5  --   --   --  13.5  --   --  13.5  --   HCT 40.5  --   --   --  40.4  --   --  41.3  --   PLT 215  --   --   --  229  --   --  215  --   LABPROT  --   --  14.6  --   --   --   --   --   --   INR  --   --  1.15  --   --   --   --   --   --   HEPARINUNFRC  --   --   --   --  <0.10*   < > 0.28* 0.22* 0.34  CREATININE 0.86  --   --   --  0.89  --   --   --   --   TROPONINI  --  10.23*  --  53.62* 54.25*  --   --   --   --    < > = values in this interval not displayed.    Estimated Creatinine Clearance: 128.6 mL/min (by C-G formula based on SCr of 0.89 mg/dL).   Assessment: 47 yo male admitted for Chest pain. Elevated initial troponin now status post LHC. Patient has 3V CAD with plans for CABG on 5/8.  Heparin level = 0.34 on heparin 2100 units/hr. No bleeding noted.  Therapeutic heparin level x1. CBC stable.  Goal of Therapy:  Heparin level 0.3-0.7 units/ml Monitor platelets by anticoagulation protocol: Yes   Plan:  Increase heparin gtt to 2150 units/hour Check heparin level in 6 hours to confirm remains therapeutic x 2 then daily HL. Daily heparin level and CBC  Noah Delaine, RPh Clinical Pharmacist Pager: [phone] 01/05/2018 4:57 PM

## 2018-01-05 NOTE — Progress Notes (Signed)
ANTICOAGULATION CONSULT NOTE - Follow Up Consult  Pharmacy Consult for heparin Indication: CAD awaiting CABG  Labs: Recent Labs    01/03/18 1341 01/03/18 1525 01/03/18 1610 01/03/18 2153  01/04/18 0433 01/04/18 0740 01/04/18 1533 01/05/18 0049  HGB 13.5  --   --   --   --  13.5  --   --   --   HCT 40.5  --   --   --   --  40.4  --   --   --   PLT 215  --   --   --   --  229  --   --   --   LABPROT  --   --  14.6  --   --   --   --   --   --   INR  --   --  1.15  --   --   --   --   --   --   HEPARINUNFRC  --   --   --   --    < > <0.10* <0.10* 0.21* 0.28*  CREATININE 0.86  --   --   --   --  0.89  --   --   --   TROPONINI  --  10.23*  --  53.62*  --  54.25*  --   --   --    < > = values in this interval not displayed.    Assessment: 47yo male remains subtherapeutic on heparin after rate changes though approaching goal.  Goal of Therapy:  Heparin level 0.3-0.7 units/ml   Plan:  Will increase heparin gtt by 1-2 units/kg/hr to 2000 units/hr and check level in 6 hours.    Vernard Gambles, PharmD, BCPS  01/05/2018,2:15 AM

## 2018-01-05 NOTE — Progress Notes (Signed)
Progress Note   Subjective   Doing well today, the patient denies CP or SOB.  No new concerns  Inpatient Medications    Scheduled Meds: . aspirin  81 mg Oral Daily  . heparin  1,000 Units Intravenous Once  . insulin aspart  0-5 Units Subcutaneous QHS  . insulin aspart  0-9 Units Subcutaneous TID WC  . insulin glargine  20 Units Subcutaneous Daily  . metoprolol tartrate  25 mg Oral BID  . sodium chloride flush  3 mL Intravenous Q12H   Continuous Infusions: . sodium chloride    . heparin 2,000 Units/hr (01/05/18 0237)  . nitroGLYCERIN 5 mcg/min (01/05/18 0200)   PRN Meds: sodium chloride, acetaminophen, nitroGLYCERIN, ondansetron (ZOFRAN) IV, sodium chloride flush   Vital Signs    Vitals:   01/05/18 0447 01/05/18 0449 01/05/18 0733 01/05/18 0918  BP: 114/82  119/78   Pulse: (!) 109  (!) 102 99  Resp:   20   Temp:  99.7 F (37.6 C) 99 F (37.2 C)   TempSrc:  Oral Oral   SpO2: 96%  96%   Weight:  237 lb 6.4 oz (107.7 kg)    Height:        Intake/Output Summary (Last 24 hours) at 01/05/2018 1042 Last data filed at 01/05/2018 0200 Gross per 24 hour  Intake 470.34 ml  Output -  Net 470.34 ml   Filed Weights   01/03/18 1519 01/04/18 0448 01/05/18 0449  Weight: 240 lb (108.9 kg) 239 lb 9.6 oz (108.7 kg) 237 lb 6.4 oz (107.7 kg)    Telemetry    sinus - Personally Reviewed  Physical Exam   GEN- The patient is well appearing, alert and oriented x 3 today.   Head- normocephalic, atraumatic Eyes-  Sclera clear, conjunctiva pink Ears- hearing intact Oropharynx- clear Neck- supple, Lungs- Clear to ausculation bilaterally, normal work of breathing Heart- Regular rate and rhythm  GI- soft, NT, ND, + BS Extremities- no clubbing, cyanosis, or edema  MS- no significant deformity or atrophy Skin- no rash or lesion Psych- euthymic mood, full affect Neuro- strength and sensation are intact   Labs    Chemistry Recent Labs  Lab 01/03/18 1341 01/04/18 0433  NA  133* 133*  K 4.0 3.6  CL 101 102  CO2 22 21*  GLUCOSE 343* 334*  BUN 8 5*  CREATININE 0.86 0.89  CALCIUM 8.7* 8.7*  PROT 6.7 6.9  ALBUMIN 3.6 3.6  AST 67* 191*  ALT 35 54  ALKPHOS 107 102  BILITOT 0.8 0.8  GFRNONAA >60 >60  GFRAA >60 >60  ANIONGAP 10 10     Hematology Recent Labs  Lab 01/03/18 1341 01/04/18 0433 01/05/18 0820  WBC 4.7 9.2 7.1  RBC 5.17 5.16 5.22  HGB 13.5 13.5 13.5  HCT 40.5 40.4 41.3  MCV 78.3 78.3 79.1  MCH 26.1 26.2 25.9*  MCHC 33.3 33.4 32.7  RDW 13.3 13.5 13.5  PLT 215 229 215    Cardiac Enzymes Recent Labs  Lab 01/03/18 1525 01/03/18 2153 01/04/18 0433  TROPONINI 10.23* 53.62* 54.25*    Recent Labs  Lab 01/03/18 1329  TROPIPOC 3.13*    Echo 01/04/18- EF 30-35%, anterior, septal and paical AK, severe HK of the basal-mid inferolateral and apical inferior walls, mild to moderate MR     Assessment & Plan    1.  NSTEMI 3V CAD Planned for CABG on Wednesday Continue IV heparin and nitro titrate beta blocker again tomorrow Could consider LifeVest  at discharge  2. Diabetes Diabetes management consulted Will add lantus at this time 20u daily  3. Overweight Body mass index is 32.2 kg/m.  4. Low grade fever yesterday Improved WBC ok Blood cultures pending  Hillis Range MD, [facility] 01/05/2018 10:42 AM

## 2018-01-06 ENCOUNTER — Inpatient Hospital Stay (HOSPITAL_COMMUNITY): Payer: Self-pay

## 2018-01-06 ENCOUNTER — Encounter (HOSPITAL_COMMUNITY): Payer: Self-pay | Admitting: Interventional Cardiology

## 2018-01-06 DIAGNOSIS — E118 Type 2 diabetes mellitus with unspecified complications: Secondary | ICD-10-CM

## 2018-01-06 LAB — PULMONARY FUNCTION TEST
DL/VA % PRED: 122 %
DL/VA: 5.81 ml/min/mmHg/L
DLCO COR % PRED: 90 %
DLCO COR: 31.65 ml/min/mmHg
DLCO UNC % PRED: 84 %
DLCO UNC: 29.6 ml/min/mmHg
FEF 25-75 POST: 4.8 L/s
FEF 25-75 PRE: 3.37 L/s
FEF2575-%Change-Post: 42 %
FEF2575-%Pred-Post: 132 %
FEF2575-%Pred-Pre: 93 %
FEV1-%Change-Post: 5 %
FEV1-%Pred-Post: 95 %
FEV1-%Pred-Pre: 90 %
FEV1-Post: 3.48 L
FEV1-Pre: 3.29 L
FEV1FVC-%Change-Post: 10 %
FEV1FVC-%Pred-Pre: 101 %
FEV6-%CHANGE-POST: -4 %
FEV6-%PRED-POST: 88 %
FEV6-%PRED-PRE: 92 %
FEV6-PRE: 4.07 L
FEV6-Post: 3.9 L
FEV6FVC-%PRED-POST: 103 %
FEV6FVC-%PRED-PRE: 103 %
FVC-%Change-Post: -4 %
FVC-%PRED-POST: 85 %
FVC-%Pred-Pre: 89 %
FVC-Post: 3.9 L
FVC-Pre: 4.07 L
POST FEV6/FVC RATIO: 100 %
PRE FEV1/FVC RATIO: 81 %
Post FEV1/FVC ratio: 89 %
Pre FEV6/FVC Ratio: 100 %
RV % pred: 65 %
RV: 1.39 L
TLC % pred: 76 %
TLC: 5.64 L

## 2018-01-06 LAB — HEPARIN LEVEL (UNFRACTIONATED)
Heparin Unfractionated: 0.28 IU/mL — ABNORMAL LOW (ref 0.30–0.70)
Heparin Unfractionated: 0.45 IU/mL (ref 0.30–0.70)

## 2018-01-06 LAB — CBC
HEMATOCRIT: 38.1 % — AB (ref 39.0–52.0)
HEMOGLOBIN: 12.5 g/dL — AB (ref 13.0–17.0)
MCH: 25.7 pg — AB (ref 26.0–34.0)
MCHC: 32.8 g/dL (ref 30.0–36.0)
MCV: 78.4 fL (ref 78.0–100.0)
Platelets: 206 10*3/uL (ref 150–400)
RBC: 4.86 MIL/uL (ref 4.22–5.81)
RDW: 13.4 % (ref 11.5–15.5)
WBC: 6.3 10*3/uL (ref 4.0–10.5)

## 2018-01-06 LAB — GLUCOSE, CAPILLARY
GLUCOSE-CAPILLARY: 178 mg/dL — AB (ref 65–99)
GLUCOSE-CAPILLARY: 215 mg/dL — AB (ref 65–99)
GLUCOSE-CAPILLARY: 233 mg/dL — AB (ref 65–99)
Glucose-Capillary: 179 mg/dL — ABNORMAL HIGH (ref 65–99)

## 2018-01-06 MED ORDER — ATORVASTATIN CALCIUM 80 MG PO TABS: 80.0000 mg | ORAL_TABLET | Freq: Every day | ORAL | Status: DC

## 2018-01-06 MED ORDER — ATORVASTATIN CALCIUM 80 MG PO TABS
80.0000 mg | ORAL_TABLET | Freq: Every day | ORAL | Status: DC
  Administered 2018-01-06 – 2018-01-15 (×9): 80 mg via ORAL
  Filled 2018-01-06 (×9): qty 1

## 2018-01-06 MED ORDER — ALBUTEROL SULFATE (2.5 MG/3ML) 0.083% IN NEBU
2.5000 mg | INHALATION_SOLUTION | Freq: Once | RESPIRATORY_TRACT | Status: AC
  Administered 2018-01-06: 2.5 mg via RESPIRATORY_TRACT

## 2018-01-06 NOTE — Progress Notes (Addendum)
Progress Note  Patient Name: Allen Cox Date of Encounter: 01/06/2018  Primary Cardiologist: No primary care provider on file.  Subjective   Feeling well this morning. No chest pain.   Inpatient Medications    Scheduled Meds: . aspirin  81 mg Oral Daily  . insulin aspart  0-5 Units Subcutaneous QHS  . insulin aspart  0-9 Units Subcutaneous TID WC  . insulin glargine  20 Units Subcutaneous Daily  . metoprolol tartrate  25 mg Oral BID  . sodium chloride flush  3 mL Intravenous Q12H   Continuous Infusions: . sodium chloride    . heparin 2,150 Units/hr (01/06/18 0248)  . nitroGLYCERIN 5 mcg/min (01/05/18 0200)   PRN Meds: sodium chloride, acetaminophen, nitroGLYCERIN, ondansetron (ZOFRAN) IV, sodium chloride flush   Vital Signs    Vitals:   01/05/18 1547 01/05/18 2033 01/06/18 0001 01/06/18 0434  BP: 107/61 112/75 111/75 118/78  Pulse: 99 (!) 101 96 (!) 103  Resp:  Temp: 99.5 F (37.5 C)  99.8 F (37.7 C) 99.9 F (37.7 C)  TempSrc: Oral  Oral Oral  SpO2: 98% 98% 99% 97%  Weight:    238 lb 4.8 oz (108.1 kg)  Height:        Intake/Output Summary (Last 24 hours) at 01/06/2018 0851 Last data filed at 01/06/2018 0700 Gross per 24 hour  Intake 741 ml  Output 1025 ml  Net -284 ml   Filed Weights   01/04/18 0448 01/05/18 0449 01/06/18 0434  Weight: 239 lb 9.6 oz (108.7 kg) 237 lb 6.4 oz (107.7 kg) 238 lb 4.8 oz (108.1 kg)    Telemetry    SR - Personally Reviewed  ECG    N/a - Personally Reviewed  Physical Exam   General: Well developed, well nourished, male appearing in no acute distress. Head: Normocephalic, atraumatic.  Neck: Supple without bruits, JVD. Lungs:  Resp regular and unlabored, CTA. Heart: RRR, S1, S2, no S3, S4, or murmur; no rub. Abdomen: Soft, non-tender, non-distended with normoactive bowel sounds.  Extremities: No clubbing, cyanosis, edema. Distal pedal pulses are 2+ bilaterally. Neuro: Alert and oriented X 3. Moves all  extremities spontaneously. Psych: Normal affect.  Labs    Chemistry Recent Labs  Lab 01/03/18 1341 01/04/18 0433  NA 133* 133*  K 4.0 3.6  CL 101 102  CO2 22 21*  GLUCOSE 343* 334*  BUN 8 5*  CREATININE 0.86 0.89  CALCIUM 8.7* 8.7*  PROT 6.7 6.9  ALBUMIN 3.6 3.6  AST 67* 191*  ALT 35 54  ALKPHOS 107 102  BILITOT 0.8 0.8  GFRNONAA >60 >60  GFRAA >60 >60  ANIONGAP 10 10     Hematology Recent Labs  Lab 01/04/18 0433 01/05/18 0820 01/06/18 0336  WBC 9.2 7.1 6.3  RBC 5.16 5.22 4.86  HGB 13.5 13.5 12.5*  HCT 40.4 41.3 38.1*  MCV 78.3 79.1 78.4  MCH 26.2 25.9* 25.7*  MCHC 33.4 32.7 32.8  RDW 13.5 13.5 13.4  PLT 229 215 206    Cardiac Enzymes Recent Labs  Lab 01/03/18 1525 01/03/18 2153 01/04/18 0433  TROPONINI 10.23* 53.62* 54.25*    Recent Labs  Lab 01/03/18 1329  TROPIPOC 3.13*     BNPNo results for input(s): BNP, PROBNP in the last 168 hours.   DDimer No results for input(s): DDIMER in the last 168 hours.    Radiology    No results found.  Cardiac Studies   Cath: 01/03/18  Conclusion  Post Atrio lesion is 100% stenosed.  Dist RCA lesion is 25% stenosed.  Mid RCA lesion is 60% stenosed.  Prox RCA lesion is 25% stenosed.  Prox Cx to Mid Cx lesion is 75% stenosed.  Ost 1st Mrg to 1st Mrg lesion is 75% stenosed.  Ost 2nd Mrg to 2nd Mrg lesion is 40% stenosed.  Prox LAD to Mid LAD lesion is 99% stenosed.  2nd Diag lesion is 60% stenosed.  There is moderate left ventricular systolic dysfunction.  The left ventricular ejection fraction is 25-35% by visual estimate.  LV end diastolic pressure is mildly elevated.  There is no aortic valve stenosis.   Severe three vessel disease detected in the setting of NSTEMI.  Likely uncontrolled diabetic.  Will obtain cardiac surgery consult.     TTE: 01/04/18  Study Conclusions  - Left ventricle: The cavity size was normal. Wall thickness was   increased in a pattern of mild LVH.  Systolic function was   moderately to severely reduced. The estimated ejection fraction   was in the range of 30% to 35%. Left ventricular diastolic   function parameters were normal. - Regional wall motion abnormality: Akinesis of the mid anterior,   basal-mid anteroseptal, apical septal, and apical myocardium;   severe hypokinesis of the apical anterior and mid anterolateral   myocardium; hypokinesis of the basal-mid inferolateral   myocardium; moderate hypokinesis of the mid inferoseptal and   apical inferior myocardium. - Mitral valve: There was mild to moderate regurgitation. - Atrial septum: No defect or patent foramen ovale was identified. - Tricuspid valve: There was mild regurgitation.  Patient Profile     47 y.o. male with PMH of tobacco use, and undiagnosed diabetes who presented with chest pain and NSTEMI. Underwent cath with 3v CAD and now planned for CABG.   Assessment & Plan    1. NSTEMI: peaked at 54.25. Cath with 3v disease noted. TCTS consulted and seen by Dr. Dorris Fetch. Plan is for CABG on 5/8. No chest pain reported, on of nitro currently. Also on heparin gtt.   2. ICM: EF noted at 30-35% on echo. On metoprolol  BID. Cr stable on last check, recheck in the am. Consider adding ACEi/ARB given HF. No signs of volume overload on exam.   3. DM: Hgb A1c 12.7 on admission. He was not seeing a PCP on a regular basis as outpatient. CBGs now better controlled. On SSI along with Lantus 20units daily. Diabetes coordinator consulted.   4. Fever: Developed a low grade fever on 5/4. WBC normal. BCs with no growth to date.   5. HL: LDL 157. Add high dose atorvastatin. AST was elevated at 191. Will check in the am.    Signed, Laverda Page, NP  01/06/2018, 8:51 AM  Pager # [phone]   For questions or updates, please contact [facility] Please consult [email or web address] for contact info under Cardiology/STEMI.   I have examined the patient and reviewed assessment and  plan and discussed with patient.  Agree with above as stated.  MI with significant troponin.  Plan for CABG on Wednesday.  COntiue aggressive secondary prevention with DM control and lipid management.   Acute systolic heart failure:  He appears to be breathing well.  Not much net diuresis over the past few days.  Lance Muss

## 2018-01-06 NOTE — Progress Notes (Addendum)
ANTICOAGULATION CONSULT NOTE - Follow Up Consult  Pharmacy Consult for Heparin Indication: chest pain/ACS, awaiting CABG  No Known Allergies  Patient Measurements: Height: 6' (182.9 cm) Weight: 238 lb 4.8 oz (108.1 kg) IBW/kg (Calculated) : 77.6 Heparin Dosing Weight: 100.5 kg  Vital Signs: Temp: 99.9 F (37.7 C) (05/06 0434) Temp Source: Oral (05/06 0434) BP: 118/78 (05/06 0434) Pulse Rate: 103 (05/06 0434)  Labs: Recent Labs    01/03/18 1341 01/03/18 1525 01/03/18 1610 01/03/18 2153 01/04/18 0433  01/05/18 0820 01/05/18 1622 01/05/18 2314 01/06/18 0336  HGB 13.5  --   --   --  13.5  --  13.5  --   --  12.5*  HCT 40.5  --   --   --  40.4  --  41.3  --   --  38.1*  PLT 215  --   --   --  229  --  215  --   --  206  LABPROT  --   --  14.6  --   --   --   --   --   --   --   INR  --   --  1.15  --   --   --   --   --   --   --   HEPARINUNFRC  --   --   --   --  <0.10*   < > 0.22* 0.34 0.33 0.28*  CREATININE 0.86  --   --   --  0.89  --   --   --   --   --   TROPONINI  --  10.23*  --  53.62* 54.25*  --   --   --   --   --    < > = values in this interval not displayed.    Estimated Creatinine Clearance: 128.9 mL/min (by C-G formula based on SCr of 0.89 mg/dL).  Assessment:   47 yr old male admitted with chest pain. 3vCAD on cath 5/3, planning CABG 5/8.    Heparin level is subtherapeutic (0.28) on 2150 units/hr.  Goal of Therapy:  Heparin level 0.3-0.7 units/ml Monitor platelets by anticoagulation protocol: Yes   Plan:   Increase heparin drip to 2300 units/hr  Heparin level ~6 hrs after increase.  Daily heparin level and CBC while on heparin.  CABG 5/8.  Dennie Fetters, [place] Pager: [phone] 01/06/2018,9:03 AM   Addendum:   Heparin level is now therapeutic (0.45) on 2300 units/hr.   No further changes today.   Next heparin level and CBC in am.  Hilarie Fredrickson, RPh 01/06/2018 4:25 PM

## 2018-01-06 NOTE — Progress Notes (Signed)
Inpatient Diabetes Program Recommendations  AACE/ADA: New Consensus Statement on Inpatient Glycemic Control (2015)  Target Ranges:  Prepandial:   less than 140 mg/dL      Peak postprandial:   less than 180 mg/dL (1-2 hours)      Critically ill patients:  140 - 180 mg/dL   Lab Results  Component Value Date   GLUCAP 178 (H) 01/06/2018   HGBA1C 12.7 (H) 01/03/2018    Review of Glycemic ControlResults for LINFORD, QUINTELA (MRN [id number]) as of 01/06/2018 16:03  Ref. Range 01/05/2018 17:40 01/05/2018 21:18 01/06/2018 07:37 01/06/2018 12:21  Glucose-Capillary Latest Ref Range: 65 - 99 mg/dL 562 (H) 130 (H) 865 (H) 178 (H)   Diabetes history: New onset Type 2 DM  Outpatient Diabetes medications: None Current orders for Inpatient glycemic control:  Novolog sensitive tid with meals and HS, Lantus 20 units daily  Inpatient Diabetes Program Recommendations:    Talked with patient regarding new diagnosis of DM.  He states that there is a family history of DM . We discussed A1C results and standards of care.  We further discussed types of DM , medications, importance of glycemic control with heart surgery and dietary restrictions/etc.  He has copy of Living well with DM booklet at bedside.  He is overwhelmed with new information.  We will follow him after surgery as well. Unclear if he has medication coverage after d/c?  He has been giving insulin today per RN in his leg.  Will follow.  Thanks, Beryl Meager, RN, BC-ADM Inpatient Diabetes Coordinator Pager [phone] (8a-5p)

## 2018-01-07 ENCOUNTER — Encounter (HOSPITAL_COMMUNITY): Payer: Self-pay | Admitting: Anesthesiology

## 2018-01-07 ENCOUNTER — Encounter (HOSPITAL_COMMUNITY): Payer: Self-pay

## 2018-01-07 DIAGNOSIS — I5021 Acute systolic (congestive) heart failure: Secondary | ICD-10-CM

## 2018-01-07 DIAGNOSIS — I214 Non-ST elevation (NSTEMI) myocardial infarction: Principal | ICD-10-CM

## 2018-01-07 DIAGNOSIS — E876 Hypokalemia: Secondary | ICD-10-CM

## 2018-01-07 LAB — COMPREHENSIVE METABOLIC PANEL
ALT: 23 U/L (ref 17–63)
AST: 20 U/L (ref 15–41)
Albumin: 2.9 g/dL — ABNORMAL LOW (ref 3.5–5.0)
Alkaline Phosphatase: 73 U/L (ref 38–126)
Anion gap: 10 (ref 5–15)
BUN: 5 mg/dL — AB (ref 6–20)
CO2: 23 mmol/L (ref 22–32)
Calcium: 8.3 mg/dL — ABNORMAL LOW (ref 8.9–10.3)
Chloride: 103 mmol/L (ref 101–111)
Creatinine, Ser: 0.81 mg/dL (ref 0.61–1.24)
Glucose, Bld: 133 mg/dL — ABNORMAL HIGH (ref 65–99)
POTASSIUM: 3.3 mmol/L — AB (ref 3.5–5.1)
Sodium: 136 mmol/L (ref 135–145)
Total Bilirubin: 0.9 mg/dL (ref 0.3–1.2)
Total Protein: 6.2 g/dL — ABNORMAL LOW (ref 6.5–8.1)

## 2018-01-07 LAB — BLOOD GAS, ARTERIAL
ACID-BASE DEFICIT: 2.7 mmol/L — AB (ref 0.0–2.0)
BICARBONATE: 20.5 mmol/L (ref 20.0–28.0)
Drawn by: 270271
FIO2: 21
O2 Saturation: 95.9 %
PCO2 ART: 29.1 mmHg — AB (ref 32.0–48.0)
PH ART: 7.463 — AB (ref 7.350–7.450)
Patient temperature: 98.6
pO2, Arterial: 79.5 mmHg — ABNORMAL LOW (ref 83.0–108.0)

## 2018-01-07 LAB — URINALYSIS, COMPLETE (UACMP) WITH MICROSCOPIC
BILIRUBIN URINE: NEGATIVE
Bacteria, UA: NONE SEEN
Glucose, UA: NEGATIVE mg/dL
Ketones, ur: 5 mg/dL — AB
Leukocytes, UA: NEGATIVE
NITRITE: NEGATIVE
PH: 5 (ref 5.0–8.0)
Protein, ur: NEGATIVE mg/dL
SPECIFIC GRAVITY, URINE: 1.021 (ref 1.005–1.030)

## 2018-01-07 LAB — HEPARIN LEVEL (UNFRACTIONATED): Heparin Unfractionated: 0.35 IU/mL (ref 0.30–0.70)

## 2018-01-07 LAB — CBC
HCT: 36.4 % — ABNORMAL LOW (ref 39.0–52.0)
Hemoglobin: 11.9 g/dL — ABNORMAL LOW (ref 13.0–17.0)
MCH: 25.5 pg — AB (ref 26.0–34.0)
MCHC: 32.7 g/dL (ref 30.0–36.0)
MCV: 78.1 fL (ref 78.0–100.0)
Platelets: 233 10*3/uL (ref 150–400)
RBC: 4.66 MIL/uL (ref 4.22–5.81)
RDW: 13.3 % (ref 11.5–15.5)
WBC: 5.1 10*3/uL (ref 4.0–10.5)

## 2018-01-07 LAB — GLUCOSE, CAPILLARY
GLUCOSE-CAPILLARY: 136 mg/dL — AB (ref 65–99)
GLUCOSE-CAPILLARY: 202 mg/dL — AB (ref 65–99)
Glucose-Capillary: 156 mg/dL — ABNORMAL HIGH (ref 65–99)
Glucose-Capillary: 139 mg/dL — ABNORMAL HIGH (ref 65–99)

## 2018-01-07 LAB — PROTIME-INR
INR: 0.99
PROTHROMBIN TIME: 13 s (ref 11.4–15.2)

## 2018-01-07 LAB — SURGICAL PCR SCREEN
MRSA, PCR: NEGATIVE
STAPHYLOCOCCUS AUREUS: NEGATIVE

## 2018-01-07 LAB — APTT: aPTT: 89 seconds — ABNORMAL HIGH (ref 24–36)

## 2018-01-07 LAB — ABO/RH: ABO/RH(D): O POS

## 2018-01-07 MED ORDER — EPINEPHRINE PF 1 MG/ML IJ SOLN
0.0000 ug/min | INTRAVENOUS | Status: DC
  Filled 2018-01-07: qty 4

## 2018-01-07 MED ORDER — POTASSIUM CHLORIDE 2 MEQ/ML IV SOLN
80.0000 meq | INTRAVENOUS | Status: DC
  Filled 2018-01-07: qty 40

## 2018-01-07 MED ORDER — SODIUM CHLORIDE 0.9 % IV SOLN
1500.0000 mg | INTRAVENOUS | Status: AC
  Administered 2018-01-08: 1500 mg via INTRAVENOUS
  Filled 2018-01-07: qty 1500

## 2018-01-07 MED ORDER — MILRINONE LACTATE IN DEXTROSE 20-5 MG/100ML-% IV SOLN
0.1250 ug/kg/min | INTRAVENOUS | Status: DC
  Filled 2018-01-07: qty 100

## 2018-01-07 MED ORDER — POTASSIUM CHLORIDE CRYS ER 20 MEQ PO TBCR
40.0000 meq | EXTENDED_RELEASE_TABLET | Freq: Once | ORAL | Status: AC
  Administered 2018-01-07: 40 meq via ORAL
  Filled 2018-01-07: qty 2

## 2018-01-07 MED ORDER — NITROGLYCERIN IN D5W 200-5 MCG/ML-% IV SOLN
2.0000 ug/min | INTRAVENOUS | Status: DC
  Filled 2018-01-07: qty 250

## 2018-01-07 MED ORDER — BISACODYL 5 MG PO TBEC
5.0000 mg | DELAYED_RELEASE_TABLET | Freq: Once | ORAL | Status: AC
  Administered 2018-01-07: 5 mg via ORAL
  Filled 2018-01-07: qty 1

## 2018-01-07 MED ORDER — DEXMEDETOMIDINE HCL IN NACL 400 MCG/100ML IV SOLN
0.1000 ug/kg/h | INTRAVENOUS | Status: AC
  Administered 2018-01-08: .5 ug/kg/h via INTRAVENOUS
  Filled 2018-01-07: qty 100

## 2018-01-07 MED ORDER — PLASMA-LYTE 148 IV SOLN
INTRAVENOUS | Status: AC
  Administered 2018-01-08: 500 mL
  Filled 2018-01-07: qty 2.5

## 2018-01-07 MED ORDER — CHLORHEXIDINE GLUCONATE 0.12 % MT SOLN
15.0000 mL | Freq: Once | OROMUCOSAL | Status: AC
  Administered 2018-01-08: 15 mL via OROMUCOSAL
  Filled 2018-01-07: qty 15

## 2018-01-07 MED ORDER — CHLORHEXIDINE GLUCONATE CLOTH 2 % EX PADS
6.0000 | MEDICATED_PAD | Freq: Once | CUTANEOUS | Status: AC
  Administered 2018-01-07: 6 via TOPICAL

## 2018-01-07 MED ORDER — TRANEXAMIC ACID (OHS) BOLUS VIA INFUSION
15.0000 mg/kg | INTRAVENOUS | Status: AC
  Administered 2018-01-08: 1624.5 mg via INTRAVENOUS
  Filled 2018-01-07: qty 1625

## 2018-01-07 MED ORDER — DIAZEPAM 5 MG PO TABS
5.0000 mg | ORAL_TABLET | Freq: Once | ORAL | Status: AC
  Administered 2018-01-08: 5 mg via ORAL
  Filled 2018-01-07: qty 1

## 2018-01-07 MED ORDER — DOPAMINE-DEXTROSE 3.2-5 MG/ML-% IV SOLN
0.0000 ug/kg/min | INTRAVENOUS | Status: AC
  Administered 2018-01-08: 5 ug/kg/min via INTRAVENOUS
  Filled 2018-01-07: qty 250

## 2018-01-07 MED ORDER — SODIUM CHLORIDE 0.9 % IV SOLN
INTRAVENOUS | Status: DC
  Filled 2018-01-07: qty 30

## 2018-01-07 MED ORDER — SODIUM CHLORIDE 0.9 % IV SOLN
1.5000 g | INTRAVENOUS | Status: AC
  Administered 2018-01-08: 1.5 g via INTRAVENOUS
  Filled 2018-01-07: qty 1.5

## 2018-01-07 MED ORDER — TRANEXAMIC ACID 1000 MG/10ML IV SOLN
1.5000 mg/kg/h | INTRAVENOUS | Status: AC
  Administered 2018-01-08: 1.5 mg/kg/h via INTRAVENOUS
  Filled 2018-01-07: qty 25

## 2018-01-07 MED ORDER — TEMAZEPAM 15 MG PO CAPS
15.0000 mg | ORAL_CAPSULE | Freq: Once | ORAL | Status: AC | PRN
  Administered 2018-01-07: 15 mg via ORAL
  Filled 2018-01-07: qty 1

## 2018-01-07 MED ORDER — SODIUM CHLORIDE 0.9 % IV SOLN
750.0000 mg | INTRAVENOUS | Status: DC
  Filled 2018-01-07: qty 750

## 2018-01-07 MED ORDER — SODIUM CHLORIDE 0.9 % IV SOLN
30.0000 ug/min | INTRAVENOUS | Status: DC
  Filled 2018-01-07: qty 2

## 2018-01-07 MED ORDER — MAGNESIUM SULFATE 50 % IJ SOLN
40.0000 meq | INTRAMUSCULAR | Status: DC
  Filled 2018-01-07: qty 9.85

## 2018-01-07 MED ORDER — INSULIN REGULAR HUMAN 100 UNIT/ML IJ SOLN
INTRAMUSCULAR | Status: AC
  Administered 2018-01-08: 1.3 [IU]/h via INTRAVENOUS
  Filled 2018-01-07: qty 1

## 2018-01-07 MED ORDER — TRANEXAMIC ACID (OHS) PUMP PRIME SOLUTION
2.0000 mg/kg | INTRAVENOUS | Status: DC
  Filled 2018-01-07: qty 2.17

## 2018-01-07 MED ORDER — METOPROLOL TARTRATE 12.5 MG HALF TABLET
12.5000 mg | ORAL_TABLET | Freq: Once | ORAL | Status: AC
  Administered 2018-01-08: 12.5 mg via ORAL
  Filled 2018-01-07: qty 1

## 2018-01-07 MED ORDER — CHLORHEXIDINE GLUCONATE CLOTH 2 % EX PADS
6.0000 | MEDICATED_PAD | Freq: Once | CUTANEOUS | Status: AC
  Administered 2018-01-08: 6 via TOPICAL

## 2018-01-07 MED FILL — Heparin Sod (Porcine)-NaCl IV Soln 1000 Unit/500ML-0.9%: INTRAVENOUS | Qty: 1000 | Status: AC

## 2018-01-07 NOTE — Progress Notes (Signed)
ANTICOAGULATION CONSULT NOTE - Follow Up Consult  Pharmacy Consult for Heparin Indication: chest pain/ACS, awaiting CABG  No Known Allergies  Patient Measurements: Height: 6' (182.9 cm) Weight: 238 lb 12.8 oz (108.3 kg) IBW/kg (Calculated) : 77.6 Heparin Dosing Weight: 100.5 kg  Vital Signs: Temp: 98.9 F (37.2 C) (05/07 0735) Temp Source: Oral (05/07 0735) BP: 109/81 (05/07 0910) Pulse Rate: 92 (05/07 0944)  Labs: Recent Labs    01/05/18 0820  01/06/18 0336 01/06/18 1509 01/07/18 0506  HGB 13.5  --  12.5*  --  11.9*  HCT 41.3  --  38.1*  --  36.4*  PLT 215  --  206  --  233  HEPARINUNFRC 0.22*   < > 0.28* 0.45 0.35  CREATININE  --   --   --   --  0.81   < > = values in this interval not displayed.    Estimated Creatinine Clearance: 141.8 mL/min (by C-G formula based on SCr of 0.81 mg/dL).  Assessment:   47 yr old male admitted with chest pain. 3vCAD on cath 5/3, planning CABG 5/8.    Heparin level remains therapeutic (0.35) on 2300 units/hr.  Goal of Therapy:  Heparin level 0.3-0.7 units/ml Monitor platelets by anticoagulation protocol: Yes   Plan:   Continue heparin drip at 2300 units/hr  Daily heparin level and CBC while on heparin.  CABG 5/8.  Dennie Fetters, RPh Pager: [phone] 01/07/2018,10:53 AM

## 2018-01-07 NOTE — Progress Notes (Signed)
      [address]       [place] [address]             [phone]      No complaints  BP 108/73 (BP Location: Right Arm)   Pulse 96   Temp 99.3 F (37.4 C) (Oral)   Resp 17   Ht 6' (1.829 m)   Wt 238 lb 12.8 oz (108.3 kg)   SpO2 98%   BMI 32.39 kg/m   Exam unchanged  He is aware of the indications, risks, benefits and alternatives. He accepts the risks and wishes to proceed  All questions answered  Salvatore Decent. Dorris Fetch, MD [facility] [phone]

## 2018-01-07 NOTE — Anesthesia Preprocedure Evaluation (Addendum)
Anesthesia Evaluation  Patient identified by MRN, date of birth, ID band Patient awake    Reviewed: Allergy & Precautions, NPO status , Patient's Chart, lab work & pertinent test results  Airway Mallampati: III  TM Distance: >3 FB Neck ROM: Full    Dental  (+) Teeth Intact, Dental Advisory Given   Pulmonary former smoker,    breath sounds clear to auscultation       Cardiovascular + Past MI   Rhythm:Regular Rate:Normal     Neuro/Psych negative neurological ROS  negative psych ROS   GI/Hepatic negative GI ROS, Neg liver ROS,   Endo/Other  negative endocrine ROS  Renal/GU negative Renal ROS     Musculoskeletal negative musculoskeletal ROS (+)   Abdominal (+) + obese,   Peds  Hematology negative hematology ROS (+)   Anesthesia Other Findings   Reproductive/Obstetrics                            Lab Results  Component Value Date   WBC 5.1 01/07/2018   HGB 11.9 (L) 01/07/2018   HCT 36.4 (L) 01/07/2018   MCV 78.1 01/07/2018   PLT 233 01/07/2018   Lab Results  Component Value Date   CREATININE 0.81 01/07/2018   BUN 5 (L) 01/07/2018   NA 136 01/07/2018   K 3.3 (L) 01/07/2018   CL 103 01/07/2018   CO2 23 01/07/2018   Lab Results  Component Value Date   INR 1.15 01/03/2018   Echo: - Left ventricle: The cavity size was normal. Wall thickness was   increased in a pattern of mild LVH. Systolic function was   moderately to severely reduced. The estimated ejection fraction   was in the range of 30% to 35%. Left ventricular diastolic   function parameters were normal. - Regional wall motion abnormality: Akinesis of the mid anterior,   basal-mid anteroseptal, apical septal, and apical myocardium;   severe hypokinesis of the apical anterior and mid anterolateral   myocardium; hypokinesis of the basal-mid inferolateral   myocardium; moderate hypokinesis of the mid inferoseptal and   apical  inferior myocardium. - Mitral valve: There was mild to moderate regurgitation. - Atrial septum: No defect or patent foramen ovale was identified. - Tricuspid valve: There was mild regurgitation.   Anesthesia Physical Anesthesia Plan  ASA: IV  Anesthesia Plan: General   Post-op Pain Management:    Induction: Intravenous  PONV Risk Score and Plan: 2 and Treatment may vary due to age or medical condition  Airway Management Planned: Oral ETT  Additional Equipment: Arterial line, CVP, PA Cath, TEE and Ultrasound Guidance Line Placement  Intra-op Plan:   Post-operative Plan: Post-operative intubation/ventilation  Informed Consent: I have reviewed the patients History and Physical, chart, labs and discussed the procedure including the risks, benefits and alternatives for the proposed anesthesia with the patient or authorized representative who has indicated his/her understanding and acceptance.   Dental advisory given  Plan Discussed with: CRNA  Anesthesia Plan Comments:        Anesthesia Quick Evaluation

## 2018-01-07 NOTE — Care Management Note (Signed)
Case Management Note  Patient Details  Name: Allen Cox MRN: [id number] Date of Birth: 03/05/1971  Subjective/Objective: Pt presented for Chest Pain. S/p cardiac cath 01-03-18 that  revealed-Severe three vessel disease. Plan for CABG 01-08-18. PTA from home with parents. Pt lives in a once level home. PT/OT to evaluate post procedure for disposition recommendations.   Action/Plan: CM will continue to monitor for disposition needs.   Expected Discharge Date:                  Expected Discharge Plan:  Home/Self Care  In-House Referral:     Discharge planning Services  CM Consult  Post Acute Care Choice:    Choice offered to:     DME Arranged:    DME Agency:     HH Arranged:    HH Agency:     Status of Service:  In process, will continue to follow  If discussed at Long Length of Stay Meetings, dates discussed:    Additional Comments:  Gala Lewandowsky, RN 01/07/2018, 3:10 PM

## 2018-01-07 NOTE — H&P (View-Only) (Signed)
      [address]       [place], [address]             [phone]      No complaints  BP 108/73 (BP Location: Right Arm)   Pulse 96   Temp 99.3 F (37.4 C) (Oral)   Resp 17   Ht 6' (1.829 m)   Wt 238 lb 12.8 oz (108.3 kg)   SpO2 98%   BMI 32.39 kg/m   Exam unchanged  He is aware of the indications, risks, benefits and alternatives. He accepts the risks and wishes to proceed  All questions answered  Ahmad Vanwey C. Matti Minney, MD [facility] [phone]  

## 2018-01-07 NOTE — Progress Notes (Signed)
Discussed sternal precautions, IS (2500 mL), mobility and d/c planning. Good reception. He had already been reading book. Gave him video to watch and Move in the Tube guideline. Also gave careguide. He sts his family will be providing care for him at d/c.  [phone] 3:07 PM 01/07/2018 Ethelda Chick CES, ACSM

## 2018-01-07 NOTE — Progress Notes (Signed)
Progress Note  Patient Name: Allen Cox Date of Encounter: 01/07/2018  Primary Cardiologist: No primary care provider on file.   Subjective   No chest pain  Inpatient Medications    Scheduled Meds: . aspirin  81 mg Oral Daily  . atorvastatin  80 mg Oral QHS  . insulin aspart  0-5 Units Subcutaneous QHS  . insulin aspart  0-9 Units Subcutaneous TID WC  . insulin glargine  20 Units Subcutaneous Daily  . metoprolol tartrate  25 mg Oral BID  . potassium chloride  40 mEq Oral Once  . sodium chloride flush  3 mL Intravenous Q12H   Continuous Infusions: . sodium chloride    . heparin 2,300 Units/hr (01/07/18 0215)  . nitroGLYCERIN 5 mcg/min (01/05/18 0200)   PRN Meds: sodium chloride, acetaminophen, nitroGLYCERIN, ondansetron (ZOFRAN) IV, sodium chloride flush   Vital Signs    Vitals:   01/07/18 0528 01/07/18 0735 01/07/18 0910 01/07/18 0944  BP: 104/70 92/65 109/81   Pulse: 94 84  92  Resp: 17     Temp: (!) 100.4 F (38 C) 98.9 F (37.2 C)    TempSrc: Oral Oral    SpO2: 97% 94%    Weight: 238 lb 12.8 oz (108.3 kg)     Height:        Intake/Output Summary (Last 24 hours) at 01/07/2018 1103 Last data filed at 01/07/2018 0939 Gross per 24 hour  Intake 1271.18 ml  Output 300 ml  Net 971.18 ml   Filed Weights   01/05/18 0449 01/06/18 0434 01/07/18 0528  Weight: 237 lb 6.4 oz (107.7 kg) 238 lb 4.8 oz (108.1 kg) 238 lb 12.8 oz (108.3 kg)    Telemetry    NSR- Personally Reviewed  ECG      Physical Exam   GEN: No acute distress.   Neck: No JVD Cardiac: RRR, no murmurs, rubs, or gallops.  Respiratory: Clear to auscultation bilaterally. GI: Soft, nontender, non-distended  MS: No edema; No deformity. Neuro:  Nonfocal  Psych: Normal affect   Labs    Chemistry Recent Labs  Lab 01/03/18 1341 01/04/18 0433 01/07/18 0506  NA 133* 133* 136  K 4.0 3.6 3.3*  CL 101 102 103  CO2 22 21* 23  GLUCOSE 343* 334* 133*  BUN 8 5* 5*  CREATININE 0.86 0.89  0.81  CALCIUM 8.7* 8.7* 8.3*  PROT 6.7 6.9 6.2*  ALBUMIN 3.6 3.6 2.9*  AST 67* 191* 20  ALT 35 54 23  ALKPHOS 107 102 73  BILITOT 0.8 0.8 0.9  GFRNONAA >60 >60 >60  GFRAA >60 >60 >60  ANIONGAP Hematology Recent Labs  Lab 01/05/18 0820 01/06/18 0336 01/07/18 0506  WBC 7.1 6.3 5.1  RBC 5.22 4.86 4.66  HGB 13.5 12.5* 11.9*  HCT 41.3 38.1* 36.4*  MCV 79.1 78.4 78.1  MCH 25.9* 25.7* 25.5*  MCHC 32.7 32.8 32.7  RDW 13.5 13.4 13.3  PLT 215 206 233    Cardiac Enzymes Recent Labs  Lab 01/03/18 1525 01/03/18 2153 01/04/18 0433  TROPONINI 10.23* 53.62* 54.25*    Recent Labs  Lab 01/03/18 1329  TROPIPOC 3.13*     BNPNo results for input(s): BNP, PROBNP in the last 168 hours.   DDimer No results for input(s): DDIMER in the last 168 hours.   Radiology    No results found.  Cardiac Studies   Cath showing multivessel disease  Patient Profile     47 y.o. male  with CAD, NSTEMI, plan for CABG.  Assessment & Plan    1) COntinue heparin for CAD.  Plan for CABG in AM. Renal function stable.   2) Hypokalemia: REplace potassium today.  3) Acute systolic heart failure:  He appears to be able to lie flat.  Not on Lasix at this time but will likely need some diuresis postoperatively.   For questions or updates, please contact [facility] Please consult [email or web address] for contact info under Cardiology/STEMI.      Signed, Lance Muss, MD  01/07/2018, 11:03 AM

## 2018-01-08 ENCOUNTER — Inpatient Hospital Stay (HOSPITAL_COMMUNITY): Payer: Self-pay

## 2018-01-08 ENCOUNTER — Inpatient Hospital Stay (HOSPITAL_COMMUNITY): Payer: Self-pay | Admitting: Anesthesiology

## 2018-01-08 ENCOUNTER — Inpatient Hospital Stay (HOSPITAL_COMMUNITY)
Admission: EM | Disposition: A | Discharge status: Home Health | Payer: Self-pay | Source: Home / Self Care | Attending: Thoracic Surgery (Cardiothoracic Vascular Surgery)

## 2018-01-08 DIAGNOSIS — Z951 Presence of aortocoronary bypass graft: Secondary | ICD-10-CM

## 2018-01-08 HISTORY — PX: TEE WITHOUT CARDIOVERSION: SHX5443

## 2018-01-08 HISTORY — PX: CORONARY ARTERY BYPASS GRAFT: SHX141

## 2018-01-08 LAB — HEMOGLOBIN AND HEMATOCRIT, BLOOD
HCT: 29.2 % — ABNORMAL LOW (ref 39.0–52.0)
Hemoglobin: 9.4 g/dL — ABNORMAL LOW (ref 13.0–17.0)

## 2018-01-08 LAB — POCT I-STAT, CHEM 8
BUN: 4 mg/dL — ABNORMAL LOW (ref 6–20)
BUN: 3 mg/dL — ABNORMAL LOW (ref 6–20)
BUN: 4 mg/dL — ABNORMAL LOW (ref 6–20)
BUN: 4 mg/dL — ABNORMAL LOW (ref 6–20)
BUN: 4 mg/dL — AB (ref 6–20)
BUN: 3 mg/dL — AB (ref 6–20)
BUN: 4 mg/dL — AB (ref 6–20)
BUN: 4 mg/dL — AB (ref 6–20)
BUN: 3 mg/dL — AB (ref 6–20)
CALCIUM ION: 1.06 mmol/L — AB (ref 1.15–1.40)
CALCIUM ION: 1.05 mmol/L — AB (ref 1.15–1.40)
CALCIUM ION: 1.71 mmol/L — AB (ref 1.15–1.40)
CHLORIDE: 103 mmol/L (ref 101–111)
CHLORIDE: 101 mmol/L (ref 101–111)
CHLORIDE: 102 mmol/L (ref 101–111)
CHLORIDE: 103 mmol/L (ref 101–111)
CHLORIDE: 102 mmol/L (ref 101–111)
CHLORIDE: 103 mmol/L (ref 101–111)
CREATININE: 0.7 mg/dL (ref 0.61–1.24)
CREATININE: 0.7 mg/dL (ref 0.61–1.24)
Calcium, Ion: 1.22 mmol/L (ref 1.15–1.40)
Calcium, Ion: 1.03 mmol/L — ABNORMAL LOW (ref 1.15–1.40)
Calcium, Ion: 1.09 mmol/L — ABNORMAL LOW (ref 1.15–1.40)
Calcium, Ion: 1.1 mmol/L — ABNORMAL LOW (ref 1.15–1.40)
Calcium, Ion: 0.99 mmol/L — ABNORMAL LOW (ref 1.15–1.40)
Calcium, Ion: 1.14 mmol/L — ABNORMAL LOW (ref 1.15–1.40)
Chloride: 102 mmol/L (ref 101–111)
Chloride: 104 mmol/L (ref 101–111)
Chloride: 106 mmol/L (ref 101–111)
Creatinine, Ser: 0.7 mg/dL (ref 0.61–1.24)
Creatinine, Ser: 0.6 mg/dL — ABNORMAL LOW (ref 0.61–1.24)
Creatinine, Ser: 0.7 mg/dL (ref 0.61–1.24)
Creatinine, Ser: 0.5 mg/dL — ABNORMAL LOW (ref 0.61–1.24)
Creatinine, Ser: 0.4 mg/dL — ABNORMAL LOW (ref 0.61–1.24)
Creatinine, Ser: 0.7 mg/dL (ref 0.61–1.24)
Creatinine, Ser: 0.7 mg/dL (ref 0.61–1.24)
GLUCOSE: 286 mg/dL — AB (ref 65–99)
Glucose, Bld: 124 mg/dL — ABNORMAL HIGH (ref 65–99)
Glucose, Bld: 109 mg/dL — ABNORMAL HIGH (ref 65–99)
Glucose, Bld: 115 mg/dL — ABNORMAL HIGH (ref 65–99)
Glucose, Bld: 157 mg/dL — ABNORMAL HIGH (ref 65–99)
Glucose, Bld: 161 mg/dL — ABNORMAL HIGH (ref 65–99)
Glucose, Bld: 176 mg/dL — ABNORMAL HIGH (ref 65–99)
Glucose, Bld: 186 mg/dL — ABNORMAL HIGH (ref 65–99)
Glucose, Bld: 190 mg/dL — ABNORMAL HIGH (ref 65–99)
HCT: 35 % — ABNORMAL LOW (ref 39.0–52.0)
HCT: 32 % — ABNORMAL LOW (ref 39.0–52.0)
HCT: 27 % — ABNORMAL LOW (ref 39.0–52.0)
HCT: 26 % — ABNORMAL LOW (ref 39.0–52.0)
HCT: 26 % — ABNORMAL LOW (ref 39.0–52.0)
HEMATOCRIT: 31 % — AB (ref 39.0–52.0)
HEMATOCRIT: 30 % — AB (ref 39.0–52.0)
HEMATOCRIT: 28 % — AB (ref 39.0–52.0)
HEMATOCRIT: 25 % — AB (ref 39.0–52.0)
HEMOGLOBIN: 11.9 g/dL — AB (ref 13.0–17.0)
HEMOGLOBIN: 10.5 g/dL — AB (ref 13.0–17.0)
HEMOGLOBIN: 10.9 g/dL — AB (ref 13.0–17.0)
Hemoglobin: 10.2 g/dL — ABNORMAL LOW (ref 13.0–17.0)
Hemoglobin: 9.5 g/dL — ABNORMAL LOW (ref 13.0–17.0)
Hemoglobin: 9.2 g/dL — ABNORMAL LOW (ref 13.0–17.0)
Hemoglobin: 8.5 g/dL — ABNORMAL LOW (ref 13.0–17.0)
Hemoglobin: 8.8 g/dL — ABNORMAL LOW (ref 13.0–17.0)
Hemoglobin: 8.8 g/dL — ABNORMAL LOW (ref 13.0–17.0)
POTASSIUM: 4.3 mmol/L (ref 3.5–5.1)
POTASSIUM: 3.2 mmol/L — AB (ref 3.5–5.1)
POTASSIUM: 3.6 mmol/L (ref 3.5–5.1)
Potassium: 3.4 mmol/L — ABNORMAL LOW (ref 3.5–5.1)
Potassium: 3.3 mmol/L — ABNORMAL LOW (ref 3.5–5.1)
Potassium: 3.6 mmol/L (ref 3.5–5.1)
Potassium: 4.5 mmol/L (ref 3.5–5.1)
Potassium: 3.4 mmol/L — ABNORMAL LOW (ref 3.5–5.1)
Potassium: 3.4 mmol/L — ABNORMAL LOW (ref 3.5–5.1)
SODIUM: 137 mmol/L (ref 135–145)
SODIUM: 138 mmol/L (ref 135–145)
SODIUM: 135 mmol/L (ref 135–145)
SODIUM: 136 mmol/L (ref 135–145)
SODIUM: 138 mmol/L (ref 135–145)
Sodium: 138 mmol/L (ref 135–145)
Sodium: 141 mmol/L (ref 135–145)
Sodium: 140 mmol/L (ref 135–145)
Sodium: 140 mmol/L (ref 135–145)
TCO2: 23 mmol/L (ref 22–32)
TCO2: 22 mmol/L (ref 22–32)
TCO2: 23 mmol/L (ref 22–32)
TCO2: 23 mmol/L (ref 22–32)
TCO2: 24 mmol/L (ref 22–32)
TCO2: 18 mmol/L — ABNORMAL LOW (ref 22–32)
TCO2: 24 mmol/L (ref 22–32)
TCO2: 21 mmol/L — AB (ref 22–32)
TCO2: 20 mmol/L — ABNORMAL LOW (ref 22–32)

## 2018-01-08 LAB — CBC
HCT: 36.2 % — ABNORMAL LOW (ref 39.0–52.0)
HCT: 28.5 % — ABNORMAL LOW (ref 39.0–52.0)
HCT: 26.8 % — ABNORMAL LOW (ref 39.0–52.0)
Hemoglobin: 12 g/dL — ABNORMAL LOW (ref 13.0–17.0)
Hemoglobin: 9.1 g/dL — ABNORMAL LOW (ref 13.0–17.0)
Hemoglobin: 8.6 g/dL — ABNORMAL LOW (ref 13.0–17.0)
MCH: 26 pg (ref 26.0–34.0)
MCH: 25.2 pg — AB (ref 26.0–34.0)
MCH: 25.4 pg — ABNORMAL LOW (ref 26.0–34.0)
MCHC: 33.1 g/dL (ref 30.0–36.0)
MCHC: 31.9 g/dL (ref 30.0–36.0)
MCHC: 32.1 g/dL (ref 30.0–36.0)
MCV: 78.4 fL (ref 78.0–100.0)
MCV: 78.9 fL (ref 78.0–100.0)
MCV: 79.1 fL (ref 78.0–100.0)
PLATELETS: 263 10*3/uL (ref 150–400)
PLATELETS: 179 10*3/uL (ref 150–400)
Platelets: 136 10*3/uL — ABNORMAL LOW (ref 150–400)
RBC: 4.62 MIL/uL (ref 4.22–5.81)
RBC: 3.61 MIL/uL — ABNORMAL LOW (ref 4.22–5.81)
RBC: 3.39 MIL/uL — ABNORMAL LOW (ref 4.22–5.81)
RDW: 13.4 % (ref 11.5–15.5)
RDW: 13.4 % (ref 11.5–15.5)
RDW: 13.5 % (ref 11.5–15.5)
WBC: 4.3 10*3/uL (ref 4.0–10.5)
WBC: 16.2 10*3/uL — ABNORMAL HIGH (ref 4.0–10.5)
WBC: 17.5 10*3/uL — AB (ref 4.0–10.5)

## 2018-01-08 LAB — PROTIME-INR
INR: 1.67
PROTHROMBIN TIME: 19.6 s — AB (ref 11.4–15.2)

## 2018-01-08 LAB — POCT I-STAT 3, ART BLOOD GAS (G3+)
Acid-base deficit: 3 mmol/L — ABNORMAL HIGH (ref 0.0–2.0)
Acid-base deficit: 14 mmol/L — ABNORMAL HIGH (ref 0.0–2.0)
Acid-base deficit: 7 mmol/L — ABNORMAL HIGH (ref 0.0–2.0)
Acid-base deficit: 4 mmol/L — ABNORMAL HIGH (ref 0.0–2.0)
Acid-base deficit: 3 mmol/L — ABNORMAL HIGH (ref 0.0–2.0)
Acid-base deficit: 9 mmol/L — ABNORMAL HIGH (ref 0.0–2.0)
BICARBONATE: 20.2 mmol/L (ref 20.0–28.0)
BICARBONATE: 17 mmol/L — AB (ref 20.0–28.0)
Bicarbonate: 22.4 mmol/L (ref 20.0–28.0)
Bicarbonate: 15.7 mmol/L — ABNORMAL LOW (ref 20.0–28.0)
Bicarbonate: 23.3 mmol/L (ref 20.0–28.0)
Bicarbonate: 23.6 mmol/L (ref 20.0–28.0)
O2 SAT: 100 %
O2 SAT: 80 %
O2 SAT: 99 %
O2 SAT: 99 %
O2 Saturation: 100 %
O2 Saturation: 91 %
PCO2 ART: 38.2 mmHg (ref 32.0–48.0)
PCO2 ART: 52.8 mmHg — AB (ref 32.0–48.0)
PCO2 ART: 51.5 mmHg — AB (ref 32.0–48.0)
PCO2 ART: 35 mmHg (ref 32.0–48.0)
PH ART: 7.375 (ref 7.350–7.450)
PH ART: 7.278 — AB (ref 7.350–7.450)
PH ART: 7.27 — AB (ref 7.350–7.450)
PH ART: 7.29 — AB (ref 7.350–7.450)
PO2 ART: 291 mmHg — AB (ref 83.0–108.0)
PO2 ART: 53 mmHg — AB (ref 83.0–108.0)
PO2 ART: 69 mmHg — AB (ref 83.0–108.0)
PO2 ART: 175 mmHg — AB (ref 83.0–108.0)
Patient temperature: 36.6
Patient temperature: 36.4
TCO2: 24 mmol/L (ref 22–32)
TCO2: 17 mmol/L — ABNORMAL LOW (ref 22–32)
TCO2: 22 mmol/L (ref 22–32)
TCO2: 25 mmol/L (ref 22–32)
TCO2: 25 mmol/L (ref 22–32)
TCO2: 18 mmol/L — AB (ref 22–32)
pCO2 arterial: 50.7 mmHg — ABNORMAL HIGH (ref 32.0–48.0)
pCO2 arterial: 49.5 mmHg — ABNORMAL HIGH (ref 32.0–48.0)
pH, Arterial: 7.082 — CL (ref 7.350–7.450)
pH, Arterial: 7.209 — ABNORMAL LOW (ref 7.350–7.450)
pO2, Arterial: 316 mmHg — ABNORMAL HIGH (ref 83.0–108.0)
pO2, Arterial: 143 mmHg — ABNORMAL HIGH (ref 83.0–108.0)

## 2018-01-08 LAB — GLUCOSE, CAPILLARY
GLUCOSE-CAPILLARY: 276 mg/dL — AB (ref 65–99)
Glucose-Capillary: 114 mg/dL — ABNORMAL HIGH (ref 65–99)
Glucose-Capillary: 210 mg/dL — ABNORMAL HIGH (ref 65–99)
Glucose-Capillary: 218 mg/dL — ABNORMAL HIGH (ref 65–99)
Glucose-Capillary: 201 mg/dL — ABNORMAL HIGH (ref 65–99)
Glucose-Capillary: 257 mg/dL — ABNORMAL HIGH (ref 65–99)
Glucose-Capillary: 295 mg/dL — ABNORMAL HIGH (ref 65–99)
Glucose-Capillary: 248 mg/dL — ABNORMAL HIGH (ref 65–99)

## 2018-01-08 LAB — COMPREHENSIVE METABOLIC PANEL
ALT: 27 U/L (ref 17–63)
ANION GAP: 8 (ref 5–15)
AST: 23 U/L (ref 15–41)
Albumin: 3 g/dL — ABNORMAL LOW (ref 3.5–5.0)
Alkaline Phosphatase: 84 U/L (ref 38–126)
BUN: 5 mg/dL — ABNORMAL LOW (ref 6–20)
CALCIUM: 8.6 mg/dL — AB (ref 8.9–10.3)
CHLORIDE: 105 mmol/L (ref 101–111)
CO2: 24 mmol/L (ref 22–32)
Creatinine, Ser: 0.82 mg/dL (ref 0.61–1.24)
Glucose, Bld: 112 mg/dL — ABNORMAL HIGH (ref 65–99)
Potassium: 3.5 mmol/L (ref 3.5–5.1)
SODIUM: 137 mmol/L (ref 135–145)
TOTAL PROTEIN: 6.3 g/dL — AB (ref 6.5–8.1)
Total Bilirubin: 0.9 mg/dL (ref 0.3–1.2)

## 2018-01-08 LAB — POCT I-STAT 4, (NA,K, GLUC, HGB,HCT)
Glucose, Bld: 208 mg/dL — ABNORMAL HIGH (ref 65–99)
HEMATOCRIT: 24 % — AB (ref 39.0–52.0)
HEMOGLOBIN: 8.2 g/dL — AB (ref 13.0–17.0)
Potassium: 3.5 mmol/L (ref 3.5–5.1)
Sodium: 143 mmol/L (ref 135–145)

## 2018-01-08 LAB — PLATELET COUNT: Platelets: 174 10*3/uL (ref 150–400)

## 2018-01-08 LAB — APTT: aPTT: 35 seconds (ref 24–36)

## 2018-01-08 LAB — CREATININE, SERUM
CREATININE: 1.11 mg/dL (ref 0.61–1.24)
GFR calc Af Amer: >60 mL/min (ref >60)

## 2018-01-08 LAB — HEPARIN LEVEL (UNFRACTIONATED): Heparin Unfractionated: 0.58 IU/mL (ref 0.30–0.70)

## 2018-01-08 LAB — PREPARE RBC (CROSSMATCH)

## 2018-01-08 LAB — MAGNESIUM: MAGNESIUM: 2.4 mg/dL (ref 1.7–2.4)

## 2018-01-08 SURGERY — CORONARY ARTERY BYPASS GRAFTING (CABG)
Anesthesia: General | Site: Inguinal

## 2018-01-08 MED ORDER — EPINEPHRINE PF 1 MG/ML IJ SOLN
INTRAMUSCULAR | Status: DC | PRN
  Administered 2018-01-08: 4 ug/min via INTRAVENOUS

## 2018-01-08 MED ORDER — INSULIN REGULAR BOLUS VIA INFUSION
0.0000 [IU] | Freq: Three times a day (TID) | INTRAVENOUS | Status: DC
  Filled 2018-01-08: qty 10

## 2018-01-08 MED ORDER — ACETAMINOPHEN 160 MG/5ML PO SOLN: 650.0000 mg | Freq: Once | ORAL | Status: AC

## 2018-01-08 MED ORDER — BISACODYL 5 MG PO TBEC
10.0000 mg | DELAYED_RELEASE_TABLET | Freq: Every day | ORAL | Status: DC
  Administered 2018-01-10 – 2018-01-14 (×5): 10 mg via ORAL
  Filled 2018-01-08 (×6): qty 2

## 2018-01-08 MED ORDER — ALBUMIN HUMAN 5 % IV SOLN
INTRAVENOUS | Status: DC | PRN
  Administered 2018-01-08 (×2): via INTRAVENOUS

## 2018-01-08 MED ORDER — BISACODYL 10 MG RE SUPP: 10.0000 mg | Freq: Every day | RECTAL | Status: DC

## 2018-01-08 MED ORDER — SODIUM CHLORIDE 0.9 % IV SOLN
INTRAVENOUS | Status: DC | PRN
  Administered 2018-01-08: 750 mg via INTRAVENOUS

## 2018-01-08 MED ORDER — SODIUM CHLORIDE 0.9 % IV SOLN
INTRAVENOUS | Status: DC
  Administered 2018-01-08: 17:00:00 via INTRAVENOUS

## 2018-01-08 MED ORDER — ROCURONIUM BROMIDE 50 MG/5ML IV SOLN
INTRAVENOUS | Status: AC
  Filled 2018-01-08: qty 1

## 2018-01-08 MED ORDER — AMIODARONE HCL IN DEXTROSE 360-4.14 MG/200ML-% IV SOLN
30.0000 mg/h | INTRAVENOUS | Status: DC
  Administered 2018-01-09 – 2018-01-10 (×4): 30 mg/h via INTRAVENOUS
  Filled 2018-01-08 (×5): qty 200

## 2018-01-08 MED ORDER — EPINEPHRINE PF 1 MG/ML IJ SOLN
0.5000 ug/min | INTRAVENOUS | Status: DC
  Administered 2018-01-08: 8 ug/min via INTRAVENOUS
  Administered 2018-01-09 (×2): 7 ug/min via INTRAVENOUS
  Administered 2018-01-10: 4 ug/min via INTRAVENOUS
  Filled 2018-01-08 (×4): qty 4

## 2018-01-08 MED ORDER — ASPIRIN 81 MG PO CHEW
324.0000 mg | CHEWABLE_TABLET | Freq: Every day | ORAL | Status: DC
  Administered 2018-01-09 – 2018-01-16 (×2): 324 mg
  Filled 2018-01-08 (×2): qty 4

## 2018-01-08 MED ORDER — HEPARIN SODIUM (PORCINE) 1000 UNIT/ML IJ SOLN
INTRAMUSCULAR | Status: DC | PRN
  Administered 2018-01-08: 5000 [IU] via INTRAVENOUS
  Administered 2018-01-08: 33000 [IU] via INTRAVENOUS
  Administered 2018-01-08: 38000 [IU] via INTRAVENOUS

## 2018-01-08 MED ORDER — ORAL CARE MOUTH RINSE
15.0000 mL | OROMUCOSAL | Status: DC
  Administered 2018-01-08 – 2018-01-09 (×8): 15 mL via OROMUCOSAL

## 2018-01-08 MED ORDER — ALBUMIN HUMAN 5 % IV SOLN
250.0000 mL | INTRAVENOUS | Status: AC | PRN
  Administered 2018-01-08 (×4): 250 mL via INTRAVENOUS
  Filled 2018-01-08 (×2): qty 250

## 2018-01-08 MED ORDER — PROTAMINE SULFATE 10 MG/ML IV SOLN
INTRAVENOUS | Status: DC | PRN
  Administered 2018-01-08: 100 mg via INTRAVENOUS
  Administered 2018-01-08: 130 mg via INTRAVENOUS
  Administered 2018-01-08: 50 mg via INTRAVENOUS
  Administered 2018-01-08: 100 mg via INTRAVENOUS
  Administered 2018-01-08: 150 mg via INTRAVENOUS
  Administered 2018-01-08: 100 mg via INTRAVENOUS
  Administered 2018-01-08 (×2): 50 mg via INTRAVENOUS

## 2018-01-08 MED ORDER — OXYCODONE HCL 5 MG PO TABS
5.0000 mg | ORAL_TABLET | ORAL | Status: DC | PRN
  Administered 2018-01-09 – 2018-01-15 (×11): 10 mg via ORAL
  Filled 2018-01-08 (×11): qty 2

## 2018-01-08 MED ORDER — ACETAMINOPHEN 160 MG/5ML PO SOLN
1000.0000 mg | Freq: Four times a day (QID) | ORAL | Status: DC
  Administered 2018-01-08 – 2018-01-09 (×3): 1000 mg
  Filled 2018-01-08 (×3): qty 40.6

## 2018-01-08 MED ORDER — ACETAMINOPHEN 650 MG RE SUPP
650.0000 mg | Freq: Once | RECTAL | Status: AC
  Administered 2018-01-08: 650 mg via RECTAL

## 2018-01-08 MED ORDER — FENTANYL CITRATE (PF) 250 MCG/5ML IJ SOLN
INTRAMUSCULAR | Status: AC
  Filled 2018-01-08: qty 25

## 2018-01-08 MED ORDER — EPINEPHRINE PF 1 MG/10ML IJ SOSY
PREFILLED_SYRINGE | INTRAMUSCULAR | Status: AC
  Filled 2018-01-08: qty 20

## 2018-01-08 MED ORDER — LACTATED RINGERS IV SOLN: INTRAVENOUS | Status: DC

## 2018-01-08 MED ORDER — VASOPRESSIN 20 UNIT/ML IV SOLN
0.0300 [IU]/min | INTRAVENOUS | Status: DC
  Filled 2018-01-08: qty 2

## 2018-01-08 MED ORDER — LACTATED RINGERS IV SOLN: 500.0000 mL | Freq: Once | INTRAVENOUS | Status: DC | PRN

## 2018-01-08 MED ORDER — SODIUM BICARBONATE 8.4 % IV SOLN
100.0000 meq | Freq: Once | INTRAVENOUS | Status: AC
  Administered 2018-01-08: 100 meq via INTRAVENOUS
  Filled 2018-01-08: qty 50

## 2018-01-08 MED ORDER — METOPROLOL TARTRATE 5 MG/5ML IV SOLN: 2.5000 mg | INTRAVENOUS | Status: DC | PRN

## 2018-01-08 MED ORDER — PHENYLEPHRINE 40 MCG/ML (10ML) SYRINGE FOR IV PUSH (FOR BLOOD PRESSURE SUPPORT)
PREFILLED_SYRINGE | INTRAVENOUS | Status: AC
  Filled 2018-01-08: qty 10

## 2018-01-08 MED ORDER — ACETAMINOPHEN 500 MG PO TABS
1000.0000 mg | ORAL_TABLET | Freq: Four times a day (QID) | ORAL | Status: AC
  Administered 2018-01-09 – 2018-01-13 (×15): 1000 mg via ORAL
  Filled 2018-01-08 (×16): qty 2

## 2018-01-08 MED ORDER — SODIUM CHLORIDE 0.9% FLUSH
3.0000 mL | Freq: Two times a day (BID) | INTRAVENOUS | Status: DC
  Administered 2018-01-09 – 2018-01-14 (×10): 3 mL via INTRAVENOUS

## 2018-01-08 MED ORDER — SODIUM CHLORIDE 0.9 % IV SOLN
0.0300 [IU]/min | INTRAVENOUS | Status: DC
  Administered 2018-01-09: 0.03 [IU]/min via INTRAVENOUS
  Filled 2018-01-08 (×2): qty 2

## 2018-01-08 MED ORDER — LACTATED RINGERS IV SOLN
INTRAVENOUS | Status: DC | PRN
  Administered 2018-01-08 (×3): via INTRAVENOUS

## 2018-01-08 MED ORDER — VASOPRESSIN 20 UNIT/ML IV SOLN
INTRAVENOUS | Status: DC | PRN
  Administered 2018-01-08: 1 [IU] via INTRAVENOUS
  Administered 2018-01-08: 10 [IU] via INTRAVENOUS

## 2018-01-08 MED ORDER — MORPHINE SULFATE (PF) 2 MG/ML IV SOLN
2.0000 mg | INTRAVENOUS | Status: DC | PRN
  Administered 2018-01-09 (×2): 4 mg via INTRAVENOUS
  Administered 2018-01-09: 2 mg via INTRAVENOUS
  Administered 2018-01-09 – 2018-01-10 (×3): 4 mg via INTRAVENOUS
  Administered 2018-01-11: 2 mg via INTRAVENOUS
  Filled 2018-01-08 (×5): qty 2
  Filled 2018-01-08: qty 1
  Filled 2018-01-08: qty 2
  Filled 2018-01-08: qty 1

## 2018-01-08 MED ORDER — EPINEPHRINE PF 1 MG/10ML IJ SOSY
PREFILLED_SYRINGE | INTRAMUSCULAR | Status: DC | PRN
  Administered 2018-01-08 (×3): 1 mg via INTRAVENOUS

## 2018-01-08 MED ORDER — ARTIFICIAL TEARS OPHTHALMIC OINT
TOPICAL_OINTMENT | OPHTHALMIC | Status: DC | PRN
  Administered 2018-01-08: 1 via OPHTHALMIC

## 2018-01-08 MED ORDER — MAGNESIUM SULFATE 4 GM/100ML IV SOLN
4.0000 g | Freq: Once | INTRAVENOUS | Status: AC
  Administered 2018-01-08: 4 g via INTRAVENOUS
  Filled 2018-01-08: qty 100

## 2018-01-08 MED ORDER — PHENYLEPHRINE HCL 10 MG/ML IJ SOLN
INTRAMUSCULAR | Status: DC | PRN
  Administered 2018-01-08: 50 ug/min via INTRAVENOUS

## 2018-01-08 MED ORDER — ROCURONIUM BROMIDE 100 MG/10ML IV SOLN
INTRAVENOUS | Status: DC | PRN
  Administered 2018-01-08 (×2): 50 mg via INTRAVENOUS
  Administered 2018-01-08: 100 mg via INTRAVENOUS
  Administered 2018-01-08 (×2): 50 mg via INTRAVENOUS
  Administered 2018-01-08: 60 mg via INTRAVENOUS
  Administered 2018-01-08: 40 mg via INTRAVENOUS

## 2018-01-08 MED ORDER — PROPOFOL 10 MG/ML IV BOLUS
INTRAVENOUS | Status: AC
  Filled 2018-01-08: qty 20

## 2018-01-08 MED ORDER — MIDAZOLAM HCL 5 MG/5ML IJ SOLN
INTRAMUSCULAR | Status: DC | PRN
  Administered 2018-01-08: 2 mg via INTRAVENOUS
  Administered 2018-01-08: 1 mg via INTRAVENOUS
  Administered 2018-01-08: 3 mg via INTRAVENOUS
  Administered 2018-01-08: 1 mg via INTRAVENOUS
  Administered 2018-01-08: 3 mg via INTRAVENOUS

## 2018-01-08 MED ORDER — EPINEPHRINE PF 1 MG/ML IJ SOLN
INTRAMUSCULAR | Status: AC
  Filled 2018-01-08: qty 1

## 2018-01-08 MED ORDER — FENTANYL CITRATE (PF) 250 MCG/5ML IJ SOLN
INTRAMUSCULAR | Status: DC | PRN
  Administered 2018-01-08: 150 ug via INTRAVENOUS
  Administered 2018-01-08: 100 ug via INTRAVENOUS
  Administered 2018-01-08 (×3): 250 ug via INTRAVENOUS

## 2018-01-08 MED ORDER — AMIODARONE HCL IN DEXTROSE 360-4.14 MG/200ML-% IV SOLN
60.0000 mg/h | INTRAVENOUS | Status: AC
  Administered 2018-01-08 (×2): 60 mg/h via INTRAVENOUS
  Filled 2018-01-08: qty 200

## 2018-01-08 MED ORDER — CEFUROXIME SODIUM 1.5 G IV SOLR
1.5000 g | Freq: Two times a day (BID) | INTRAVENOUS | Status: AC
  Administered 2018-01-08 – 2018-01-10 (×4): 1.5 g via INTRAVENOUS
  Filled 2018-01-08 (×4): qty 1.5

## 2018-01-08 MED ORDER — POTASSIUM CHLORIDE 10 MEQ/50ML IV SOLN
10.0000 meq | Freq: Once | INTRAVENOUS | Status: AC
  Administered 2018-01-08: 10 meq via INTRAVENOUS

## 2018-01-08 MED ORDER — POTASSIUM CHLORIDE 10 MEQ/50ML IV SOLN
10.0000 meq | INTRAVENOUS | Status: AC
  Administered 2018-01-08 – 2018-01-09 (×3): 10 meq via INTRAVENOUS
  Filled 2018-01-08 (×3): qty 50

## 2018-01-08 MED ORDER — GELATIN ABSORBABLE MT POWD
OROMUCOSAL | Status: DC | PRN
  Administered 2018-01-08 (×3): 4 mL via TOPICAL

## 2018-01-08 MED ORDER — MILRINONE LACTATE IN DEXTROSE 20-5 MG/100ML-% IV SOLN
INTRAVENOUS | Status: DC | PRN
  Administered 2018-01-08: 0.25 ug/kg/min via INTRAVENOUS

## 2018-01-08 MED ORDER — HEMOSTATIC AGENTS (NO CHARGE) OPTIME
TOPICAL | Status: DC | PRN
  Administered 2018-01-08: 1 via TOPICAL

## 2018-01-08 MED ORDER — SODIUM BICARBONATE 8.4 % IV SOLN
50.0000 meq | Freq: Once | INTRAVENOUS | Status: AC
  Administered 2018-01-08: 50 meq via INTRAVENOUS

## 2018-01-08 MED ORDER — SODIUM CHLORIDE 0.9 % IV SOLN: 250.0000 mL | INTRAVENOUS | Status: DC

## 2018-01-08 MED ORDER — SODIUM CHLORIDE 0.9 % IV SOLN
0.0000 ug/min | INTRAVENOUS | Status: DC
  Filled 2018-01-08: qty 2

## 2018-01-08 MED ORDER — DOCUSATE SODIUM 100 MG PO CAPS
200.0000 mg | ORAL_CAPSULE | Freq: Every day | ORAL | Status: DC
  Administered 2018-01-10 – 2018-01-16 (×6): 200 mg via ORAL
  Filled 2018-01-08 (×6): qty 2

## 2018-01-08 MED ORDER — METOPROLOL TARTRATE 12.5 MG HALF TABLET
12.5000 mg | ORAL_TABLET | Freq: Two times a day (BID) | ORAL | Status: DC
  Filled 2018-01-08: qty 1

## 2018-01-08 MED ORDER — DEXMEDETOMIDINE HCL IN NACL 200 MCG/50ML IV SOLN
0.0000 ug/kg/h | INTRAVENOUS | Status: DC
  Administered 2018-01-08 – 2018-01-09 (×7): 0.7 ug/kg/h via INTRAVENOUS
  Filled 2018-01-08 (×7): qty 50

## 2018-01-08 MED ORDER — CHLORHEXIDINE GLUCONATE 0.12 % MT SOLN
15.0000 mL | OROMUCOSAL | Status: AC
  Administered 2018-01-08: 15 mL via OROMUCOSAL

## 2018-01-08 MED ORDER — MILRINONE LACTATE IN DEXTROSE 20-5 MG/100ML-% IV SOLN
0.1250 ug/kg/min | INTRAVENOUS | Status: DC
  Administered 2018-01-08 – 2018-01-11 (×6): 0.25 ug/kg/min via INTRAVENOUS
  Administered 2018-01-12: 0.125 ug/kg/min via INTRAVENOUS
  Administered 2018-01-12 – 2018-01-13 (×2): 0.25 ug/kg/min via INTRAVENOUS
  Administered 2018-01-14: 0.125 ug/kg/min via INTRAVENOUS
  Filled 2018-01-08 (×10): qty 100

## 2018-01-08 MED ORDER — VASOPRESSIN 20 UNIT/ML IV SOLN
INTRAVENOUS | Status: AC
  Filled 2018-01-08: qty 1

## 2018-01-08 MED ORDER — CALCIUM CHLORIDE 10 % IV SOLN
1.0000 g | Freq: Once | INTRAVENOUS | Status: AC
  Administered 2018-01-08: 1 g via INTRAVENOUS

## 2018-01-08 MED ORDER — VANCOMYCIN HCL IN DEXTROSE 1-5 GM/200ML-% IV SOLN
1000.0000 mg | Freq: Once | INTRAVENOUS | Status: AC
  Administered 2018-01-08: 1000 mg via INTRAVENOUS
  Filled 2018-01-08: qty 200

## 2018-01-08 MED ORDER — 0.9 % SODIUM CHLORIDE (POUR BTL) OPTIME
TOPICAL | Status: DC | PRN
  Administered 2018-01-08: 1000 mL
  Administered 2018-01-08: 6000 mL
  Administered 2018-01-08: 1000 mL

## 2018-01-08 MED ORDER — SODIUM CHLORIDE 0.9% FLUSH: 3.0000 mL | INTRAVENOUS | Status: DC | PRN

## 2018-01-08 MED ORDER — SODIUM CHLORIDE 0.9 % IV SOLN
INTRAVENOUS | Status: DC
  Administered 2018-01-08: 19.7 [IU]/h via INTRAVENOUS
  Administered 2018-01-09: 11.2 [IU]/h via INTRAVENOUS
  Administered 2018-01-09: 29.3 [IU]/h via INTRAVENOUS
  Administered 2018-01-10: 4.5 [IU]/h via INTRAVENOUS
  Filled 2018-01-08 (×4): qty 1

## 2018-01-08 MED ORDER — NOREPINEPHRINE BITARTRATE 1 MG/ML IV SOLN
0.0000 ug/min | INTRAVENOUS | Status: DC
  Administered 2018-01-08: 12 ug/min via INTRAVENOUS
  Administered 2018-01-09: 4 ug/min via INTRAVENOUS
  Administered 2018-01-10: 3 ug/min via INTRAVENOUS
  Filled 2018-01-08 (×3): qty 4

## 2018-01-08 MED ORDER — ONDANSETRON HCL 4 MG/2ML IJ SOLN
4.0000 mg | Freq: Four times a day (QID) | INTRAMUSCULAR | Status: DC | PRN
  Administered 2018-01-10: 4 mg via INTRAVENOUS
  Filled 2018-01-08 (×2): qty 2

## 2018-01-08 MED ORDER — PROPOFOL 10 MG/ML IV BOLUS
INTRAVENOUS | Status: DC | PRN
  Administered 2018-01-08: 130 mg via INTRAVENOUS

## 2018-01-08 MED ORDER — HEPARIN SODIUM (PORCINE) 1000 UNIT/ML IJ SOLN
INTRAMUSCULAR | Status: AC
Start: 2018-01-08 — End: ?
  Filled 2018-01-08: qty 1

## 2018-01-08 MED ORDER — LACTATED RINGERS IV SOLN
INTRAVENOUS | Status: DC | PRN
  Administered 2018-01-08: 09:00:00 via INTRAVENOUS

## 2018-01-08 MED ORDER — PANTOPRAZOLE SODIUM 40 MG PO TBEC
40.0000 mg | DELAYED_RELEASE_TABLET | Freq: Every day | ORAL | Status: DC
  Administered 2018-01-10 – 2018-01-16 (×7): 40 mg via ORAL
  Filled 2018-01-08 (×7): qty 1

## 2018-01-08 MED ORDER — MIDAZOLAM HCL 10 MG/2ML IJ SOLN
INTRAMUSCULAR | Status: AC
  Filled 2018-01-08: qty 2

## 2018-01-08 MED ORDER — FAMOTIDINE IN NACL 20-0.9 MG/50ML-% IV SOLN
20.0000 mg | Freq: Two times a day (BID) | INTRAVENOUS | Status: AC
  Administered 2018-01-08 – 2018-01-09 (×2): 20 mg via INTRAVENOUS
  Filled 2018-01-08 (×2): qty 50

## 2018-01-08 MED ORDER — LACTATED RINGERS IV SOLN
INTRAVENOUS | Status: DC
  Administered 2018-01-08: 18:00:00 via INTRAVENOUS

## 2018-01-08 MED ORDER — TRAMADOL HCL 50 MG PO TABS
50.0000 mg | ORAL_TABLET | ORAL | Status: DC | PRN
  Administered 2018-01-10 – 2018-01-12 (×5): 100 mg via ORAL
  Administered 2018-01-14: 50 mg via ORAL
  Filled 2018-01-08: qty 1
  Filled 2018-01-08 (×5): qty 2

## 2018-01-08 MED ORDER — CALCIUM CHLORIDE 10 % IV SOLN
INTRAVENOUS | Status: DC | PRN
  Administered 2018-01-08 (×3): 200 mg via INTRAVENOUS
  Administered 2018-01-08: 300 mg via INTRAVENOUS
  Administered 2018-01-08: 200 mg via INTRAVENOUS
  Administered 2018-01-08: 300 mg via INTRAVENOUS
  Administered 2018-01-08 (×3): 200 mg via INTRAVENOUS

## 2018-01-08 MED ORDER — PLASMA-LYTE 148 IV SOLN
INTRAVENOUS | Status: AC
  Administered 2018-01-08: 500 mL
  Filled 2018-01-08: qty 2.5

## 2018-01-08 MED ORDER — AMIODARONE HCL IN DEXTROSE 360-4.14 MG/200ML-% IV SOLN
INTRAVENOUS | Status: AC
  Filled 2018-01-08: qty 200

## 2018-01-08 MED ORDER — AMIODARONE IV BOLUS ONLY 150 MG/100ML
INTRAVENOUS | Status: DC | PRN
  Administered 2018-01-08: 150 mg via INTRAVENOUS

## 2018-01-08 MED ORDER — CHLORHEXIDINE GLUCONATE 0.12% ORAL RINSE (MEDLINE KIT)
15.0000 mL | Freq: Two times a day (BID) | OROMUCOSAL | Status: DC
  Administered 2018-01-08 – 2018-01-09 (×2): 15 mL via OROMUCOSAL

## 2018-01-08 MED ORDER — MIDAZOLAM HCL 2 MG/2ML IJ SOLN
2.0000 mg | INTRAMUSCULAR | Status: DC | PRN
  Administered 2018-01-09 (×2): 2 mg via INTRAVENOUS
  Filled 2018-01-08 (×2): qty 2

## 2018-01-08 MED ORDER — NITROGLYCERIN IN D5W 200-5 MCG/ML-% IV SOLN: 0.0000 ug/min | INTRAVENOUS | Status: DC

## 2018-01-08 MED ORDER — VASOPRESSIN 20 UNIT/ML IV SOLN
INTRAVENOUS | Status: DC | PRN
  Administered 2018-01-08: .03 [IU]/min via INTRAVENOUS

## 2018-01-08 MED ORDER — NOREPINEPHRINE BITARTRATE 1 MG/ML IV SOLN
0.0000 ug/min | INTRAVENOUS | Status: DC
  Administered 2018-01-08: 2 ug/min via INTRAVENOUS
  Filled 2018-01-08: qty 4

## 2018-01-08 MED ORDER — ASPIRIN EC 325 MG PO TBEC
325.0000 mg | DELAYED_RELEASE_TABLET | Freq: Every day | ORAL | Status: DC
  Administered 2018-01-10 – 2018-01-15 (×6): 325 mg via ORAL
  Filled 2018-01-08 (×7): qty 1

## 2018-01-08 MED ORDER — ROCURONIUM BROMIDE 50 MG/5ML IV SOLN
INTRAVENOUS | Status: AC
  Filled 2018-01-08: qty 4

## 2018-01-08 MED ORDER — SODIUM BICARBONATE 4.2 % IV SOLN
INTRAVENOUS | Status: DC | PRN
  Administered 2018-01-08: 100 meq via INTRAVENOUS

## 2018-01-08 MED ORDER — PROTAMINE SULFATE 10 MG/ML IV SOLN
INTRAVENOUS | Status: AC
  Filled 2018-01-08: qty 25

## 2018-01-08 MED ORDER — POTASSIUM CHLORIDE 10 MEQ/50ML IV SOLN
10.0000 meq | INTRAVENOUS | Status: AC
  Administered 2018-01-08 (×3): 10 meq via INTRAVENOUS
  Filled 2018-01-08: qty 50

## 2018-01-08 MED ORDER — TRANEXAMIC ACID 1000 MG/10ML IV SOLN
1.5000 mg/kg/h | INTRAVENOUS | Status: DC
  Filled 2018-01-08: qty 25

## 2018-01-08 MED ORDER — SODIUM CHLORIDE 0.45 % IV SOLN
INTRAVENOUS | Status: DC | PRN
  Administered 2018-01-08 – 2018-01-09 (×2): via INTRAVENOUS

## 2018-01-08 MED ORDER — MORPHINE SULFATE (PF) 2 MG/ML IV SOLN
1.0000 mg | INTRAVENOUS | Status: AC | PRN
  Administered 2018-01-08: 4 mg via INTRAVENOUS

## 2018-01-08 MED ORDER — METOPROLOL TARTRATE 25 MG/10 ML ORAL SUSPENSION: 12.5000 mg | Freq: Two times a day (BID) | ORAL | Status: DC

## 2018-01-08 MED ORDER — AMIODARONE HCL IN DEXTROSE 360-4.14 MG/200ML-% IV SOLN
INTRAVENOUS | Status: DC | PRN
  Administered 2018-01-08: 60 mg/h via INTRAVENOUS

## 2018-01-08 SURGICAL SUPPLY — 109 items
BAG DECANTER FOR FLEXI CONT (MISCELLANEOUS) ×7 IMPLANT
BALLN LINEAR 7.5FR IABP 34CC (BALLOONS)
BALLN LINEAR 7.5FR IABP 40CC (BALLOONS) ×5
BALLOON LINEAR 7.5FR IABP 34CC (BALLOONS) IMPLANT
BALLOON LINEAR 7.5FR IABP 40CC (BALLOONS) IMPLANT
BANDAGE ACE 4X5 VEL STRL LF (GAUZE/BANDAGES/DRESSINGS) ×5 IMPLANT
BANDAGE ACE 6X5 VEL STRL LF (GAUZE/BANDAGES/DRESSINGS) ×5 IMPLANT
BASKET HEART  (ORDER IN 25'S) (MISCELLANEOUS) ×1
BASKET HEART (ORDER IN 25'S) (MISCELLANEOUS) ×1
BASKET HEART (ORDER IN 25S) (MISCELLANEOUS) ×3 IMPLANT
BLADE STERNUM SYSTEM 6 (BLADE) ×5 IMPLANT
BNDG GAUZE ELAST 4 BULKY (GAUZE/BANDAGES/DRESSINGS) ×5 IMPLANT
CANISTER SUCT 3000ML PPV (MISCELLANEOUS) ×5 IMPLANT
CANNULA AORTIC ROOT 9FR (CANNULA) ×2 IMPLANT
CANNULA EZ GLIDE AORTIC 21FR (CANNULA) ×7 IMPLANT
CATH CPB KIT HENDRICKSON (MISCELLANEOUS) ×5 IMPLANT
CATH ROBINSON RED A/P 18FR (CATHETERS) ×5 IMPLANT
CATH THORACIC 36FR (CATHETERS) ×5 IMPLANT
CATH THORACIC 36FR RT ANG (CATHETERS) ×5 IMPLANT
CLIP FOGARTY SPRING 6M (CLIP) ×2 IMPLANT
CLIP VESOCCLUDE MED 24/CT (CLIP) IMPLANT
CLIP VESOCCLUDE SM WIDE 24/CT (CLIP) ×4 IMPLANT
CLOSURE WOUND 1/4X4 (GAUZE/BANDAGES/DRESSINGS) ×1
COVER MAYO STAND STRL (DRAPES) ×2 IMPLANT
CRADLE DONUT ADULT HEAD (MISCELLANEOUS) ×5 IMPLANT
DRAPE CARDIOVASCULAR INCISE (DRAPES) ×2
DRAPE SLUSH/WARMER DISC (DRAPES) ×5 IMPLANT
DRAPE SRG 135X102X78XABS (DRAPES) ×3 IMPLANT
DRSG COVADERM 4X14 (GAUZE/BANDAGES/DRESSINGS) ×5 IMPLANT
ELECT REM PT RETURN 9FT ADLT (ELECTROSURGICAL) ×10
ELECTRODE REM PT RTRN 9FT ADLT (ELECTROSURGICAL) ×6 IMPLANT
FELT TEFLON 1X6 (MISCELLANEOUS) ×10 IMPLANT
GAUZE SPONGE 4X4 12PLY STRL (GAUZE/BANDAGES/DRESSINGS) ×10 IMPLANT
GAUZE SPONGE 4X4 12PLY STRL LF (GAUZE/BANDAGES/DRESSINGS) ×6 IMPLANT
GLOVE BIO SURGEON STRL SZ 6 (GLOVE) ×12 IMPLANT
GLOVE BIO SURGEON STRL SZ 6.5 (GLOVE) ×4 IMPLANT
GLOVE BIO SURGEON STRL SZ7.5 (GLOVE) ×4 IMPLANT
GLOVE BIO SURGEON STRL SZ8.5 (GLOVE) ×6 IMPLANT
GLOVE BIO SURGEONS STRL SZ 6.5 (GLOVE) ×4
GLOVE BIOGEL PI IND STRL 6.5 (GLOVE) IMPLANT
GLOVE BIOGEL PI INDICATOR 6.5 (GLOVE) ×12
GLOVE ORTHO TXT STRL SZ7.5 (GLOVE) ×4 IMPLANT
GLOVE SURG SIGNA 7.5 PF LTX (GLOVE) ×21 IMPLANT
GOWN STRL REUS W/ TWL LRG LVL3 (GOWN DISPOSABLE) ×12 IMPLANT
GOWN STRL REUS W/ TWL XL LVL3 (GOWN DISPOSABLE) ×6 IMPLANT
GOWN STRL REUS W/TWL LRG LVL3 (GOWN DISPOSABLE) ×38
GOWN STRL REUS W/TWL XL LVL3 (GOWN DISPOSABLE) ×6
HEMOSTAT POWDER SURGIFOAM 1G (HEMOSTASIS) ×15 IMPLANT
HEMOSTAT SURGICEL 2X14 (HEMOSTASIS) ×5 IMPLANT
INSERT FOGARTY XLG (MISCELLANEOUS) IMPLANT
KIT BASIN OR (CUSTOM PROCEDURE TRAY) ×5 IMPLANT
KIT SUCTION CATH 14FR (SUCTIONS) ×12 IMPLANT
KIT TURNOVER KIT B (KITS) ×5 IMPLANT
KIT VASOVIEW HEMOPRO VH 3000 (KITS) ×5 IMPLANT
MARKER GRAFT CORONARY BYPASS (MISCELLANEOUS) ×15 IMPLANT
NS IRRIG 1000ML POUR BTL (IV SOLUTION) ×29 IMPLANT
PACK E OPEN HEART (SUTURE) ×5 IMPLANT
PACK OPEN HEART (CUSTOM PROCEDURE TRAY) ×5 IMPLANT
PAD ARMBOARD 7.5X6 YLW CONV (MISCELLANEOUS) ×10 IMPLANT
PAD ELECT DEFIB RADIOL ZOLL (MISCELLANEOUS) ×5 IMPLANT
PENCIL BUTTON HOLSTER BLD 10FT (ELECTRODE) ×5 IMPLANT
PUNCH AORTIC ROTATE 4.0MM (MISCELLANEOUS) IMPLANT
PUNCH AORTIC ROTATE 4.5MM 8IN (MISCELLANEOUS) ×2 IMPLANT
PUNCH AORTIC ROTATE 5MM 8IN (MISCELLANEOUS) IMPLANT
SET CARDIOPLEGIA MPS 5001102 (MISCELLANEOUS) ×2 IMPLANT
SPONGE LAP 18X18 X RAY DECT (DISPOSABLE) ×2 IMPLANT
STOPCOCK 4 WAY LG BORE MALE ST (IV SETS) ×2 IMPLANT
STRIP CLOSURE SKIN 1/4X4 (GAUZE/BANDAGES/DRESSINGS) ×1 IMPLANT
SUT BONE WAX W31G (SUTURE) ×5 IMPLANT
SUT ETHIBOND 2 0 SH (SUTURE) ×6
SUT ETHIBOND 2 0 SH 36X2 (SUTURE) IMPLANT
SUT MNCRL AB 4-0 PS2 18 (SUTURE) IMPLANT
SUT PROLENE 3 0 SH DA (SUTURE) ×5 IMPLANT
SUT PROLENE 4 0 RB 1 (SUTURE) ×4
SUT PROLENE 4 0 SH DA (SUTURE) IMPLANT
SUT PROLENE 4-0 RB1 .5 CRCL 36 (SUTURE) IMPLANT
SUT PROLENE 5 0 C 1 36 (SUTURE) ×10 IMPLANT
SUT PROLENE 6 0 C 1 30 (SUTURE) ×18 IMPLANT
SUT PROLENE 7 0 BV 1 (SUTURE) ×2 IMPLANT
SUT PROLENE 7 0 BV1 MDA (SUTURE) ×9 IMPLANT
SUT PROLENE 8 0 BV175 6 (SUTURE) ×4 IMPLANT
SUT SILK  1 MH (SUTURE) ×2
SUT SILK 1 MH (SUTURE) IMPLANT
SUT SILK 2 0 SH CR/8 (SUTURE) ×4 IMPLANT
SUT STEEL 6MS V (SUTURE) ×5 IMPLANT
SUT STEEL STERNAL CCS#1 18IN (SUTURE) IMPLANT
SUT STEEL SZ 6 DBL 3X14 BALL (SUTURE) ×5 IMPLANT
SUT VIC AB 1 CTX 36 (SUTURE) ×4
SUT VIC AB 1 CTX36XBRD ANBCTR (SUTURE) ×6 IMPLANT
SUT VIC AB 2-0 CT1 27 (SUTURE) ×2
SUT VIC AB 2-0 CT1 TAPERPNT 27 (SUTURE) IMPLANT
SUT VIC AB 2-0 CTX 27 (SUTURE) IMPLANT
SUT VIC AB 3-0 SH 27 (SUTURE)
SUT VIC AB 3-0 SH 27X BRD (SUTURE) IMPLANT
SUT VIC AB 3-0 X1 27 (SUTURE) ×2 IMPLANT
SUT VICRYL 4-0 PS2 18IN ABS (SUTURE) IMPLANT
SYSTEM SAHARA CHEST DRAIN ATS (WOUND CARE) ×5 IMPLANT
TAPE CLOTH SURG 4X10 WHT LF (GAUZE/BANDAGES/DRESSINGS) ×6 IMPLANT
TAPE PAPER 2X10 WHT MICROPORE (GAUZE/BANDAGES/DRESSINGS) ×2 IMPLANT
TOWEL GREEN STERILE (TOWEL DISPOSABLE) ×5 IMPLANT
TOWEL GREEN STERILE FF (TOWEL DISPOSABLE) ×5 IMPLANT
TRAY CATH LUMEN 1 20CM STRL (SET/KITS/TRAYS/PACK) ×2 IMPLANT
TRAY FOLEY SILVER 16FR TEMP (SET/KITS/TRAYS/PACK) ×5 IMPLANT
TUBE FEEDING 8FR 16IN STR KANG (MISCELLANEOUS) ×5 IMPLANT
TUBING ART PRESS 48 MALE/FEM (TUBING) ×4 IMPLANT
TUBING INSUFFLATION (TUBING) ×5 IMPLANT
UNDERPAD 30X30 (UNDERPADS AND DIAPERS) ×5 IMPLANT
WATER STERILE IRR 1000ML POUR (IV SOLUTION) ×10 IMPLANT
YANKAUER SUCT BULB TIP NO VENT (SUCTIONS) ×4 IMPLANT

## 2018-01-08 NOTE — Brief Op Note (Signed)
01/03/2018 - 01/08/2018  7:19 AM  PATIENT:  Allen Cox  47 y.o. male  PRE-OPERATIVE DIAGNOSIS:  CAD  POST-OPERATIVE DIAGNOSIS:  CAD  PROCEDURE:  Procedure(s): CORONARY ARTERY BYPASS GRAFTING (CABG) x five, using Left Internal Mammary Artery and Right Leg Greater Saphenous Vein harvested endoscopically (N/A) TRANSESOPHAGEAL ECHOCARDIOGRAM (TEE) (N/A) INTRA-AORTIC BALLOON PUMP INSERTION (Left) LIMA-LAD SEQ SVG-OM2-OM1 SVG-PD SVG-DIAG  SURGEON:  Surgeon(s) and Role:    * Loreli Slot, MD - Primary  PHYSICIAN ASSISTANT: Lekita Kerekes PA-C  ANESTHESIA:   general  EBL:  See anesth  amd perfusion records  BLOOD ADMINISTERED:none  DRAINS: PLEURAL AND PERICARDIAL CHEST TUBES   LOCAL MEDICATIONS USED:  NONE  SPECIMEN:  No Specimen  DISPOSITION OF SPECIMEN:  N/A  COUNTS:  YES  TOURNIQUET:  * No tourniquets in log *  DICTATION: .Other Dictation: Dictation Number PENDING  PLAN OF CARE: Admit to inpatient   PATIENT DISPOSITION:  ICU - intubated and hemodynamically stable.   Delay start of Pharmacological VTE agent (>24hrs) due to surgical blood loss or risk of bleeding: yes  COMPLICATIONS: NO KNOWN

## 2018-01-08 NOTE — Anesthesia Procedure Notes (Signed)
Procedure Name: Intubation Date/Time: 01/08/2018 8:55 AM Performed by: Teressa Lower., CRNA Pre-anesthesia Checklist: Patient identified, Emergency Drugs available, Suction available and Patient being monitored Patient Re-evaluated:Patient Re-evaluated prior to induction Oxygen Delivery Method: Circle system utilized Preoxygenation: Pre-oxygenation with 100% oxygen Induction Type: IV induction Ventilation: Mask ventilation without difficulty Laryngoscope Size: Mac and 4 Grade View: Grade II Tube type: Oral Tube size: 7.5 mm Number of attempts: 1 Airway Equipment and Method: Stylet and Oral airway Placement Confirmation: ETT inserted through vocal cords under direct vision,  positive ETCO2 and breath sounds checked- equal and bilateral Secured at: 23 cm Tube secured with: Tape Dental Injury: Teeth and Oropharynx as per pre-operative assessment

## 2018-01-08 NOTE — Progress Notes (Signed)
      [address]       [place] [address]             [phone]      S/p CABG x 5 IABP placement  Intubated, sedated  BP 98/70   Pulse (!) 179   Temp 98.1 F (36.7 C)   Resp 14   Ht 6' (1.829 m)   Wt 237 lb 11.2 oz (107.8 kg)   SpO2 100%   BMI 32.24 kg/m  HR is 88 in SR, double counting of pulse secondary to IABP PA= 27/13 CI= 2.2  On epi , norepi , vasopressin 0.03 and milrinone 0.25  Intake/Output Summary (Last 24 hours) at 01/08/2018 2004 Last data filed at 01/08/2018 1900 Gross per 24 hour  Intake 6357.16 ml  Output 2880 ml  Net 3477.16 ml   Minimal CT output  ABG- 7.27/51/143/25 on 100%, 8 PEEP, RR 14- tidal volume increased, O2 weaning  Hct= 28, creatinine 0.7  Overall stable currently, no arrhythmias postop. 3rd spacing requiring volume  Viviann Spare C. Dorris Fetch, MD [facility] [phone]

## 2018-01-08 NOTE — Anesthesia Procedure Notes (Signed)
Arterial Line Insertion Start/End5/04/2018 7:40 AM, 01/08/2018 7:50 AM Performed by: Izola Price., CRNA, CRNA  Patient location: Pre-op. Preanesthetic checklist: patient identified, IV checked, site marked, risks and benefits discussed, surgical consent, monitors and equipment checked, pre-op evaluation, timeout performed and anesthesia consent Lidocaine 1% used for infiltration Right, radial was placed Catheter size: 20 G Hand hygiene performed , maximum sterile barriers used  and Seldinger technique used Allen's test indicative of satisfactory collateral circulation Attempts: 1 Procedure performed without using ultrasound guided technique. Following insertion, dressing applied and Biopatch. Post procedure assessment: normal and unchanged  Patient tolerated the procedure well with no immediate complications. Additional procedure comments: [place] reiner SRNA placed Aline.

## 2018-01-08 NOTE — Progress Notes (Signed)
  Echocardiogram 2D Echocardiogram has been performed.  Allen Cox 01/08/2018, 9:24 AM

## 2018-01-08 NOTE — Transfer of Care (Signed)
Immediate Anesthesia Transfer of Care Note  Patient: Allen Cox  Procedure(s) Performed: CORONARY ARTERY BYPASS GRAFTING (CABG) x five, using Left Internal Mammary Artery and Right Leg Greater Saphenous Vein harvested endoscopically (N/A Chest) TRANSESOPHAGEAL ECHOCARDIOGRAM (TEE) (N/A ) INTRA-AORTIC BALLOON PUMP INSERTION (Left Inguinal)  Patient Location: SICU  Anesthesia Type:General  Level of Consciousness: Patient remains intubated per anesthesia plan  Airway & Oxygen Therapy: Patient remains intubated per anesthesia plan and Patient placed on Ventilator (see vital sign flow sheet for setting)  Post-op Assessment: Report given to RN and Post -op Vital signs reviewed and stable  Post vital signs: Reviewed and stable  Last Vitals:  Vitals Value Taken Time  BP 117/95 01/08/2018  4:34 PM  Temp 36.9 C 01/08/2018  4:37 PM  Pulse    Resp 12 01/08/2018  4:37 PM  SpO2    Vitals shown include unvalidated device data.  Last Pain:  Vitals:   01/08/18 0552  TempSrc: Oral  PainSc:       Patients Stated Pain Goal: 0 (01/07/18 1451)  Complications: No apparent anesthesia complications

## 2018-01-08 NOTE — Interval H&P Note (Signed)
History and Physical Interval Note:  01/08/2018 8:16 AM  Allen Cox  has presented today for surgery, with the diagnosis of CAD  The various methods of treatment have been discussed with the patient and family. After consideration of risks, benefits and other options for treatment, the patient has consented to  Procedure(s): CORONARY ARTERY BYPASS GRAFTING (CABG) (N/A) TRANSESOPHAGEAL ECHOCARDIOGRAM (TEE) (N/A) as a surgical intervention .  The patient's history has been reviewed, patient examined, no change in status, stable for surgery.  I have reviewed the patient's chart and labs.  Questions were answered to the patient's satisfaction.     Loreli Slot

## 2018-01-09 ENCOUNTER — Encounter (HOSPITAL_COMMUNITY): Payer: Self-pay | Admitting: Thoracic Surgery (Cardiothoracic Vascular Surgery)

## 2018-01-09 ENCOUNTER — Inpatient Hospital Stay (HOSPITAL_COMMUNITY): Payer: Self-pay

## 2018-01-09 DIAGNOSIS — Z951 Presence of aortocoronary bypass graft: Secondary | ICD-10-CM

## 2018-01-09 LAB — CBC
HEMATOCRIT: 25.9 % — AB (ref 39.0–52.0)
HEMATOCRIT: 24.4 % — AB (ref 39.0–52.0)
HEMOGLOBIN: 8.3 g/dL — AB (ref 13.0–17.0)
Hemoglobin: 8.7 g/dL — ABNORMAL LOW (ref 13.0–17.0)
MCH: 26 pg (ref 26.0–34.0)
MCH: 26.2 pg (ref 26.0–34.0)
MCHC: 33.6 g/dL (ref 30.0–36.0)
MCHC: 34 g/dL (ref 30.0–36.0)
MCV: 77.5 fL — ABNORMAL LOW (ref 78.0–100.0)
MCV: 77 fL — ABNORMAL LOW (ref 78.0–100.0)
Platelets: 171 10*3/uL (ref 150–400)
Platelets: 161 10*3/uL (ref 150–400)
RBC: 3.34 MIL/uL — AB (ref 4.22–5.81)
RBC: 3.17 MIL/uL — AB (ref 4.22–5.81)
RDW: 13.3 % (ref 11.5–15.5)
RDW: 13.5 % (ref 11.5–15.5)
WBC: 17.3 10*3/uL — AB (ref 4.0–10.5)
WBC: 15.6 10*3/uL — ABNORMAL HIGH (ref 4.0–10.5)

## 2018-01-09 LAB — POCT I-STAT 3, ART BLOOD GAS (G3+)
ACID-BASE DEFICIT: 2 mmol/L (ref 0.0–2.0)
Acid-base deficit: 4 mmol/L — ABNORMAL HIGH (ref 0.0–2.0)
Acid-base deficit: 2 mmol/L (ref 0.0–2.0)
BICARBONATE: 20.3 mmol/L (ref 20.0–28.0)
BICARBONATE: 21.6 mmol/L (ref 20.0–28.0)
Bicarbonate: 21.6 mmol/L (ref 20.0–28.0)
O2 SAT: 98 %
O2 SAT: 98 %
O2 Saturation: 96 %
PCO2 ART: 34.1 mmHg (ref 32.0–48.0)
PH ART: 7.383 (ref 7.350–7.450)
PO2 ART: 92 mmHg (ref 83.0–108.0)
TCO2: 21 mmol/L — ABNORMAL LOW (ref 22–32)
TCO2: 23 mmol/L (ref 22–32)
TCO2: 23 mmol/L (ref 22–32)
pCO2 arterial: 31.1 mmHg — ABNORMAL LOW (ref 32.0–48.0)
pCO2 arterial: 31.3 mmHg — ABNORMAL LOW (ref 32.0–48.0)
pH, Arterial: 7.451 — ABNORMAL HIGH (ref 7.350–7.450)
pH, Arterial: 7.448 (ref 7.350–7.450)
pO2, Arterial: 104 mmHg (ref 83.0–108.0)
pO2, Arterial: 80 mmHg — ABNORMAL LOW (ref 83.0–108.0)

## 2018-01-09 LAB — BASIC METABOLIC PANEL
ANION GAP: 8 (ref 5–15)
BUN: <5 mg/dL — ABNORMAL LOW (ref 6–20)
CALCIUM: 7.9 mg/dL — AB (ref 8.9–10.3)
CO2: 23 mmol/L (ref 22–32)
Chloride: 107 mmol/L (ref 101–111)
Creatinine, Ser: 0.96 mg/dL (ref 0.61–1.24)
GFR calc non Af Amer: >60 mL/min (ref >60)
Glucose, Bld: 266 mg/dL — ABNORMAL HIGH (ref 65–99)
Potassium: 4.2 mmol/L (ref 3.5–5.1)
Sodium: 138 mmol/L (ref 135–145)

## 2018-01-09 LAB — POCT I-STAT, CHEM 8
BUN: 3 mg/dL — AB (ref 6–20)
CHLORIDE: 105 mmol/L (ref 101–111)
Calcium, Ion: 1.24 mmol/L (ref 1.15–1.40)
Creatinine, Ser: 0.7 mg/dL (ref 0.61–1.24)
GLUCOSE: 107 mg/dL — AB (ref 65–99)
HCT: 26 % — ABNORMAL LOW (ref 39.0–52.0)
Hemoglobin: 8.8 g/dL — ABNORMAL LOW (ref 13.0–17.0)
Potassium: 3.5 mmol/L (ref 3.5–5.1)
Sodium: 136 mmol/L (ref 135–145)
TCO2: 23 mmol/L (ref 22–32)

## 2018-01-09 LAB — CULTURE, BLOOD (ROUTINE X 2)
CULTURE: NO GROWTH
CULTURE: NO GROWTH
SPECIAL REQUESTS: ADEQUATE
SPECIAL REQUESTS: ADEQUATE

## 2018-01-09 LAB — GLUCOSE, CAPILLARY
GLUCOSE-CAPILLARY: 112 mg/dL — AB (ref 65–99)
GLUCOSE-CAPILLARY: 113 mg/dL — AB (ref 65–99)
GLUCOSE-CAPILLARY: 113 mg/dL — AB (ref 65–99)
GLUCOSE-CAPILLARY: 116 mg/dL — AB (ref 65–99)
GLUCOSE-CAPILLARY: 129 mg/dL — AB (ref 65–99)
GLUCOSE-CAPILLARY: 251 mg/dL — AB (ref 65–99)
GLUCOSE-CAPILLARY: 269 mg/dL — AB (ref 65–99)
GLUCOSE-CAPILLARY: 270 mg/dL — AB (ref 65–99)
GLUCOSE-CAPILLARY: 279 mg/dL — AB (ref 65–99)
Glucose-Capillary: 250 mg/dL — ABNORMAL HIGH (ref 65–99)
Glucose-Capillary: 258 mg/dL — ABNORMAL HIGH (ref 65–99)
Glucose-Capillary: 257 mg/dL — ABNORMAL HIGH (ref 65–99)
Glucose-Capillary: 227 mg/dL — ABNORMAL HIGH (ref 65–99)
Glucose-Capillary: 212 mg/dL — ABNORMAL HIGH (ref 65–99)
Glucose-Capillary: 184 mg/dL — ABNORMAL HIGH (ref 65–99)
Glucose-Capillary: 182 mg/dL — ABNORMAL HIGH (ref 65–99)
Glucose-Capillary: 156 mg/dL — ABNORMAL HIGH (ref 65–99)
Glucose-Capillary: 149 mg/dL — ABNORMAL HIGH (ref 65–99)
Glucose-Capillary: 118 mg/dL — ABNORMAL HIGH (ref 65–99)
Glucose-Capillary: 120 mg/dL — ABNORMAL HIGH (ref 65–99)
Glucose-Capillary: 116 mg/dL — ABNORMAL HIGH (ref 65–99)
Glucose-Capillary: 105 mg/dL — ABNORMAL HIGH (ref 65–99)
Glucose-Capillary: 103 mg/dL — ABNORMAL HIGH (ref 65–99)
Glucose-Capillary: 112 mg/dL — ABNORMAL HIGH (ref 65–99)

## 2018-01-09 LAB — CREATININE, SERUM
CREATININE: 0.93 mg/dL (ref 0.61–1.24)
GFR calc Af Amer: >60 mL/min (ref >60)
GFR calc non Af Amer: >60 mL/min (ref >60)

## 2018-01-09 LAB — MAGNESIUM
Magnesium: 2 mg/dL (ref 1.7–2.4)
Magnesium: 1.7 mg/dL (ref 1.7–2.4)

## 2018-01-09 MED ORDER — FUROSEMIDE 10 MG/ML IJ SOLN
6.0000 mg/h | INTRAVENOUS | Status: DC
  Administered 2018-01-09: 4 mg/h via INTRAVENOUS
  Administered 2018-01-11: 6 mg/h via INTRAVENOUS
  Filled 2018-01-09 (×2): qty 25

## 2018-01-09 MED ORDER — ORAL CARE MOUTH RINSE
15.0000 mL | Freq: Two times a day (BID) | OROMUCOSAL | Status: DC
  Administered 2018-01-09 – 2018-01-16 (×6): 15 mL via OROMUCOSAL

## 2018-01-09 MED ORDER — ALBUMIN HUMAN 5 % IV SOLN
12.5000 g | Freq: Once | INTRAVENOUS | Status: AC
  Administered 2018-01-09: 12.5 g via INTRAVENOUS

## 2018-01-09 MED ORDER — POTASSIUM CHLORIDE 10 MEQ/50ML IV SOLN
10.0000 meq | INTRAVENOUS | Status: AC | PRN
  Administered 2018-01-09 (×3): 10 meq via INTRAVENOUS
  Filled 2018-01-09 (×3): qty 50

## 2018-01-09 MED FILL — Heparin Sodium (Porcine) Inj 1000 Unit/ML: INTRAMUSCULAR | Qty: 30 | Status: AC

## 2018-01-09 MED FILL — Potassium Chloride Inj 2 mEq/ML: INTRAVENOUS | Qty: 40 | Status: AC

## 2018-01-09 MED FILL — Magnesium Sulfate Inj 50%: INTRAMUSCULAR | Qty: 10 | Status: AC

## 2018-01-09 NOTE — Progress Notes (Signed)
1 Day Post-Op Procedure(s) (LRB): CORONARY ARTERY BYPASS GRAFTING (CABG) x five, using Left Internal Mammary Artery and Right Leg Greater Saphenous Vein harvested endoscopically (N/A) TRANSESOPHAGEAL ECHOCARDIOGRAM (TEE) (N/A) INTRA-AORTIC BALLOON PUMP INSERTION (Left) Subjective: Intubated but awake alert and communicating  Objective: Vital signs in last 24 hours: Temp:  [97.3 F (36.3 C)-99.9 F (37.7 C)] 99.9 F (37.7 C) (05/09 0800) Pulse Rate:  [89-198] 146 (05/09 0800) Cardiac Rhythm: Atrial paced (05/09 0749) Resp:  [11-27] 21 (05/09 0800) BP: (91-122)/(47-95) 104/47 (05/09 0758) SpO2:  [94 %-100 %] 100 % (05/09 0800) Arterial Line BP: (66-204)/(35-200) 99/50 (05/09 0800) FiO2 (%):  [50 %-100 %] 50 % (05/09 0758) Weight:  [272 lb 0.8 oz (123.4 kg)] 272 lb 0.8 oz (123.4 kg) (05/09 0500)  Hemodynamic parameters for last 24 hours: PAP: (21-30)/(13-20) 25/17 CO:  [4.9 L/min-5.3 L/min] 5.1 L/min CI:  [2.1 L/min/m2-2.3 L/min/m2] 2.2 L/min/m2  Intake/Output from previous day: 05/08 0701 - 05/09 0700 In: 9279.2 [I.V.:6149.2; Blood:750; NG/GT:30; IV Piggyback:2350] Out: 3065 [Urine:2805; Chest Tube:260] Intake/Output this shift: Total I/O In: 148 [I.V.:148] Out: 65 [Urine:45; Chest Tube:20]  General appearance: alert, cooperative and no distress Neurologic: no focal deficit Heart: regular rate and rhythm Lungs: clear to auscultation bilaterally Abdomen: normal findings: soft, non-tender  Lab Results: Recent Labs    01/08/18 2221 01/08/18 2224 01/09/18 0405  WBC 17.5*  --  17.3*  HGB 8.6* 8.8* 8.7*  HCT 26.8* 26.0* 25.9*  PLT 179  --  171   BMET:  Recent Labs    01/08/18 0501  01/08/18 2224 01/09/18 0405  NA 137   < > 140 138  K 3.5   < > 3.6 4.2  CL 105   < > 103 107  CO2 24  --   --  23  GLUCOSE 112*   < > 286* 266*  BUN 5*   < > 3* <5*  CREATININE 0.82   < > 0.70 0.96  CALCIUM 8.6*  --   --  7.9*   < > = values in this interval not displayed.     PT/INR:  Recent Labs    01/08/18 1632  LABPROT 19.6*  INR 1.67   ABG    Component Value Date/Time   PHART 7.383 01/09/2018 0525   HCO3 20.3 01/09/2018 0525   TCO2 21 (L) 01/09/2018 0525   ACIDBASEDEF 4.0 (H) 01/09/2018 0525   O2SAT 98.0 01/09/2018 0525   CBG (last 3)  Recent Labs    01/09/18 0503 01/09/18 0603 01/09/18 0706  GLUCAP 258* 257* 227*    Assessment/Plan: S/P Procedure(s) (LRB): CORONARY ARTERY BYPASS GRAFTING (CABG) x five, using Left Internal Mammary Artery and Right Leg Greater Saphenous Vein harvested endoscopically (N/A) TRANSESOPHAGEAL ECHOCARDIOGRAM (TEE) (N/A) INTRA-AORTIC BALLOON PUMP INSERTION (Left) NEURO- responsive, communicating, no focal deficit  CV- stable overnight. Will wean and hopefully dc IABP this AM  Continue drips as is for now until IABP then slowly wean  ASA, statin, beta blocker  New RBBB on ECG  No arrhythmias postop- continue IV amiodarone  RESP- remained intubated overnight. Will wean vent this AM  RENAL- creatinine and lytes OK- diurese  ENDO- CBG elevated despite high dose  Anemia secondary to ABL- no transfusions yet, follow  SCD for DVT prophylaxis, consider starting enoxaparin tomorrow   LOS: 6 days    Loreli Slot 01/09/2018

## 2018-01-09 NOTE — Progress Notes (Signed)
      [address]       [place] [address]             [phone]     Brief procedure note       Left IABP removed using usual technique, intact  Manual pressure applied to obtain hemostasis  L DP/PT doppler signals intact  Patient tol well, understands need to hold leg still x 6 hours.   Rowe Clack, PA-C

## 2018-01-09 NOTE — Anesthesia Postprocedure Evaluation (Signed)
Anesthesia Post Note  Patient: Allen Cox  Procedure(s) Performed: CORONARY ARTERY BYPASS GRAFTING (CABG) x five, using Left Internal Mammary Artery and Right Leg Greater Saphenous Vein harvested endoscopically (N/A Chest) TRANSESOPHAGEAL ECHOCARDIOGRAM (TEE) (N/A ) INTRA-AORTIC BALLOON PUMP INSERTION (Left Inguinal)     Patient location during evaluation: SICU Anesthesia Type: General Level of consciousness: awake Pain management: pain level controlled Vital Signs Assessment: post-procedure vital signs reviewed and stable Respiratory status: patient remains intubated per anesthesia plan Cardiovascular status: stable Postop Assessment: no apparent nausea or vomiting Anesthetic complications: no Comments: IABP removed, pt tolerated extubation.     Last Vitals:  Vitals:   01/09/18 1800 01/09/18 1815  BP:    Pulse: 96 96  Resp: 20 16  Temp: (!) 38.8 C (!) 38.9 C  SpO2: 97% 96%    Last Pain:  Vitals:   01/09/18 1800  TempSrc:   PainSc: Asleep                 Shelton Silvas

## 2018-01-09 NOTE — Procedures (Signed)
Extubation Procedure Note  Patient Details:   Name: Allen Cox DOB: 1970-09-23 MRN: [id number]   Airway Documentation:    Vent end date: 01/09/18 Vent end time: 1430   Evaluation  O2 sats: stable throughout Complications: No apparent complications Patient did tolerate procedure well. Bilateral Breath Sounds: Clear   Patient was extubated per the rapid wean protocol. Prior to extubation he had a VC of 1.6L patient was unable to perform NIF due to a possible leak in the balloon on the tube. RN called surgeon and was instructed to continue on with extubation due to meeting all other wean criteria. Patient had a cuff leak and no stridor was noted. RN and family member were at the bedside with RT during extubation. Patient was placed on a 4L nasal cannula and appears to be tolerating well at this time.   Yes  Darolyn Rua 01/09/2018, 2:47 PM

## 2018-01-09 NOTE — Plan of Care (Signed)
  Problem: Cardiac: Goal: Hemodynamic stability will improve Outcome: Progressing   Problem: Respiratory: Goal: Respiratory status will improve Outcome: Progressing   

## 2018-01-09 NOTE — Progress Notes (Signed)
Patient ID: Allen Cox, male   DOB: Mar 16, 1971, 47 y.o.   MRN: [id number]  TCTS Evening Rounds:   Hemodynamically stable  CI = 2.7 Epi 7 Levophed 5 Milrinone 0.25  IABP removed today.  Sinus rhythm on amio. No arrhythmias.   Urine output good  CT output low  CBC    Component Value Date/Time   WBC 15.6 (H) 01/09/2018 1531   RBC 3.17 (L) 01/09/2018 1531   HGB 8.8 (L) 01/09/2018 1537   HCT 26.0 (L) 01/09/2018 1537   PLT 161 01/09/2018 1531   MCV 77.0 (L) 01/09/2018 1531   MCH 26.2 01/09/2018 1531   MCHC 34.0 01/09/2018 1531   RDW 13.5 01/09/2018 1531     BMET    Component Value Date/Time   NA 136 01/09/2018 1537   K 3.5 01/09/2018 1537   CL 105 01/09/2018 1537   CO2 23 01/09/2018 0405   GLUCOSE 107 (H) 01/09/2018 1537   BUN 3 (L) 01/09/2018 1537   CREATININE 0.70 01/09/2018 1537   CALCIUM 7.9 (L) 01/09/2018 0405   GFRNONAA >60 01/09/2018 1531   GFRAA >60 01/09/2018 1531     A/P:  Stable postop course. Continue current plans

## 2018-01-10 ENCOUNTER — Inpatient Hospital Stay (HOSPITAL_COMMUNITY): Payer: Self-pay

## 2018-01-10 LAB — BASIC METABOLIC PANEL
ANION GAP: 9 (ref 5–15)
BUN: <5 mg/dL — ABNORMAL LOW (ref 6–20)
CALCIUM: 8.1 mg/dL — AB (ref 8.9–10.3)
CO2: 26 mmol/L (ref 22–32)
CREATININE: 0.95 mg/dL (ref 0.61–1.24)
Chloride: 99 mmol/L — ABNORMAL LOW (ref 101–111)
GLUCOSE: 102 mg/dL — AB (ref 65–99)
Potassium: 3.5 mmol/L (ref 3.5–5.1)
Sodium: 134 mmol/L — ABNORMAL LOW (ref 135–145)

## 2018-01-10 LAB — POCT I-STAT, CHEM 8
BUN: 4 mg/dL — AB (ref 6–20)
BUN: 4 mg/dL — AB (ref 6–20)
CHLORIDE: 98 mmol/L — AB (ref 101–111)
CREATININE: 0.8 mg/dL (ref 0.61–1.24)
CREATININE: 0.7 mg/dL (ref 0.61–1.24)
Calcium, Ion: 1.12 mmol/L — ABNORMAL LOW (ref 1.15–1.40)
Calcium, Ion: 1.06 mmol/L — ABNORMAL LOW (ref 1.15–1.40)
Chloride: 97 mmol/L — ABNORMAL LOW (ref 101–111)
GLUCOSE: 117 mg/dL — AB (ref 65–99)
Glucose, Bld: 149 mg/dL — ABNORMAL HIGH (ref 65–99)
HCT: 23 % — ABNORMAL LOW (ref 39.0–52.0)
HEMATOCRIT: 22 % — AB (ref 39.0–52.0)
HEMOGLOBIN: 7.8 g/dL — AB (ref 13.0–17.0)
Hemoglobin: 7.5 g/dL — ABNORMAL LOW (ref 13.0–17.0)
POTASSIUM: 3.6 mmol/L (ref 3.5–5.1)
Potassium: 3.5 mmol/L (ref 3.5–5.1)
SODIUM: 135 mmol/L (ref 135–145)
Sodium: 135 mmol/L (ref 135–145)
TCO2: 24 mmol/L (ref 22–32)
TCO2: 25 mmol/L (ref 22–32)

## 2018-01-10 LAB — GLUCOSE, CAPILLARY
GLUCOSE-CAPILLARY: 103 mg/dL — AB (ref 65–99)
GLUCOSE-CAPILLARY: 106 mg/dL — AB (ref 65–99)
GLUCOSE-CAPILLARY: 106 mg/dL — AB (ref 65–99)
GLUCOSE-CAPILLARY: 108 mg/dL — AB (ref 65–99)
GLUCOSE-CAPILLARY: 115 mg/dL — AB (ref 65–99)
GLUCOSE-CAPILLARY: 121 mg/dL — AB (ref 65–99)
GLUCOSE-CAPILLARY: 122 mg/dL — AB (ref 65–99)
GLUCOSE-CAPILLARY: 162 mg/dL — AB (ref 65–99)
GLUCOSE-CAPILLARY: 45 mg/dL — AB (ref 65–99)
GLUCOSE-CAPILLARY: 91 mg/dL (ref 65–99)
Glucose-Capillary: 101 mg/dL — ABNORMAL HIGH (ref 65–99)
Glucose-Capillary: 101 mg/dL — ABNORMAL HIGH (ref 65–99)
Glucose-Capillary: 102 mg/dL — ABNORMAL HIGH (ref 65–99)
Glucose-Capillary: 122 mg/dL — ABNORMAL HIGH (ref 65–99)
Glucose-Capillary: 96 mg/dL (ref 65–99)
Glucose-Capillary: 118 mg/dL — ABNORMAL HIGH (ref 65–99)
Glucose-Capillary: 137 mg/dL — ABNORMAL HIGH (ref 65–99)

## 2018-01-10 LAB — CBC
HCT: 23.2 % — ABNORMAL LOW (ref 39.0–52.0)
HEMOGLOBIN: 7.8 g/dL — AB (ref 13.0–17.0)
MCH: 26.1 pg (ref 26.0–34.0)
MCHC: 33.6 g/dL (ref 30.0–36.0)
MCV: 77.6 fL — ABNORMAL LOW (ref 78.0–100.0)
PLATELETS: 156 10*3/uL (ref 150–400)
RBC: 2.99 MIL/uL — ABNORMAL LOW (ref 4.22–5.81)
RDW: 14 % (ref 11.5–15.5)
WBC: 17.2 10*3/uL — ABNORMAL HIGH (ref 4.0–10.5)

## 2018-01-10 MED ORDER — POTASSIUM CHLORIDE 10 MEQ/50ML IV SOLN
10.0000 meq | INTRAVENOUS | Status: AC
  Administered 2018-01-10 (×3): 10 meq via INTRAVENOUS
  Filled 2018-01-10 (×3): qty 50

## 2018-01-10 MED ORDER — INSULIN DETEMIR 100 UNIT/ML ~~LOC~~ SOLN
12.0000 [IU] | Freq: Two times a day (BID) | SUBCUTANEOUS | Status: DC
  Administered 2018-01-10 – 2018-01-12 (×5): 12 [IU] via SUBCUTANEOUS
  Filled 2018-01-10 (×5): qty 0.12

## 2018-01-10 MED ORDER — METOCLOPRAMIDE HCL 5 MG/ML IJ SOLN
10.0000 mg | Freq: Four times a day (QID) | INTRAMUSCULAR | Status: DC
  Administered 2018-01-10 – 2018-01-12 (×9): 10 mg via INTRAVENOUS
  Filled 2018-01-10 (×9): qty 2

## 2018-01-10 MED ORDER — POTASSIUM CHLORIDE 10 MEQ/50ML IV SOLN
10.0000 meq | INTRAVENOUS | Status: AC
  Administered 2018-01-10 (×3): 10 meq via INTRAVENOUS
  Filled 2018-01-10 (×2): qty 50

## 2018-01-10 MED ORDER — POTASSIUM CHLORIDE 10 MEQ/50ML IV SOLN
INTRAVENOUS | Status: AC
  Filled 2018-01-10: qty 50

## 2018-01-10 MED ORDER — INSULIN ASPART 100 UNIT/ML ~~LOC~~ SOLN
0.0000 [IU] | SUBCUTANEOUS | Status: DC
  Administered 2018-01-10: 2 [IU] via SUBCUTANEOUS
  Administered 2018-01-10: 4 [IU] via SUBCUTANEOUS
  Administered 2018-01-11 (×5): 2 [IU] via SUBCUTANEOUS
  Administered 2018-01-11 – 2018-01-12 (×4): 4 [IU] via SUBCUTANEOUS

## 2018-01-10 MED FILL — Sodium Chloride IV Soln 0.9%: INTRAVENOUS | Qty: 2000 | Status: AC

## 2018-01-10 MED FILL — Lidocaine HCl(Cardiac) IV PF Soln Pref Syr 100 MG/5ML (2%): INTRAVENOUS | Qty: 10 | Status: AC

## 2018-01-10 MED FILL — Heparin Sodium (Porcine) Inj 1000 Unit/ML: INTRAMUSCULAR | Qty: 60 | Status: AC

## 2018-01-10 MED FILL — Sodium Bicarbonate IV Soln 8.4%: INTRAVENOUS | Qty: 50 | Status: AC

## 2018-01-10 MED FILL — Electrolyte-R (PH 7.4) Solution: INTRAVENOUS | Qty: 4000 | Status: AC

## 2018-01-10 MED FILL — Heparin Sodium (Porcine) Inj 1000 Unit/ML: INTRAMUSCULAR | Qty: 50 | Status: AC

## 2018-01-10 NOTE — Progress Notes (Signed)
2 Days Post-Op Procedure(s) (LRB): CORONARY ARTERY BYPASS GRAFTING (CABG) x five, using Left Internal Mammary Artery and Right Leg Greater Saphenous Vein harvested endoscopically (N/A) TRANSESOPHAGEAL ECHOCARDIOGRAM (TEE) (N/A) INTRA-AORTIC BALLOON PUMP INSERTION (Left) Subjective: Fluid overload No more VT  Objective: Vital signs in last 24 hours: Temp:  [99.3 F (37.4 C)-102.7 F (39.3 C)] 99.9 F (37.7 C) (05/10 0715) Pulse Rate:  [90-111] 102 (05/10 0715) Cardiac Rhythm: Sinus tachycardia (05/10 0400) Resp:  [12-52] 30 (05/10 0715) BP: (86-117)/(47-72) 109/69 (05/10 0700) SpO2:  [91 %-100 %] 95 % (05/10 0715) Arterial Line BP: (94-146)/(45-62) 126/52 (05/10 0715) FiO2 (%):  [40 %-50 %] 40 % (05/09 1346) Weight:  [261 lb 0.4 oz (118.4 kg)] 261 lb 0.4 oz (118.4 kg) (05/10 0500)  Hemodynamic parameters for last 24 hours: PAP: (19-38)/(8-25) 22/11 CO:  [5.2 L/min-8.4 L/min] 6.3 L/min CI:  [2.3 L/min/m2-3.7 L/min/m2] 2.7 L/min/m2  Intake/Output from previous day: 05/09 0701 - 05/10 0700 In: 3929.8 [I.V.:3144.8; NG/GT:85; IV Piggyback:700] Out: 3710 [Urine:3090; Emesis/NG output:450; Chest Tube:170] Intake/Output this shift: No intake/output data recorded.       Exam    General- alert and comfortable    Neck- no JVD, no cervical adenopathy palpable, no carotid bruit   Lungs- clear without rales, wheezes   Cor- regular rate and rhythm, no murmur , gallop   Abdomen- soft, non-tender   Extremities - warm, non-tender, minimal edema   Neuro- oriented, appropriate, no focal weakness   Lab Results: Recent Labs    01/09/18 1531 01/09/18 1537 01/10/18 0401  WBC 15.6*  --  17.2*  HGB 8.3* 8.8* 7.8*  HCT 24.4* 26.0* 23.2*  PLT 161  --  156   BMET:  Recent Labs    01/09/18 0405  01/09/18 1537 01/10/18 0401  NA 138  --  136 134*  K 4.2  --  3.5 3.5  CL 107  --  105 99*  CO2 23  --   --  26  GLUCOSE 266*  --  107* 102*  BUN <5*  --  3* <5*  CREATININE 0.96   < >  0.70 0.95  CALCIUM 7.9*  --   --  8.1*   < > = values in this interval not displayed.    PT/INR:  Recent Labs    01/08/18 1632  LABPROT 19.6*  INR 1.67   ABG    Component Value Date/Time   PHART 7.448 01/09/2018 1541   HCO3 21.6 01/09/2018 1541   TCO2 23 01/09/2018 1541   ACIDBASEDEF 2.0 01/09/2018 1541   O2SAT 96.0 01/09/2018 1541   CBG (last 3)  Recent Labs    01/10/18 0603 01/10/18 0658 01/10/18 0737  GLUCAP 108* 106* 102*    Assessment/Plan: S/P Procedure(s) (LRB): CORONARY ARTERY BYPASS GRAFTING (CABG) x five, using Left Internal Mammary Artery and Right Leg Greater Saphenous Vein harvested endoscopically (N/A) TRANSESOPHAGEAL ECHOCARDIOGRAM (TEE) (N/A) INTRA-AORTIC BALLOON PUMP INSERTION (Left) Mobilize expected postop anemia  Wean off vasopressin Increase lasix drip   LOS: 7 days    Kathlee Nations Trigt III 01/10/2018

## 2018-01-10 NOTE — Progress Notes (Signed)
CT surgery p.m. Rounds  Patient had excellent day- weaned off pressors On low-dose milrinone and IV amiodarone Ambulated in hallway P.m. labs satisfactory

## 2018-01-10 NOTE — Anesthesia Procedure Notes (Signed)
Central Venous Catheter Insertion Performed by: Oleta Mouse, MD, anesthesiologist Start/End5/04/2018 7:39 AM, 01/08/2018 7:44 AM Patient location: Pre-op. Preanesthetic checklist: patient identified, IV checked, site marked, risks and benefits discussed, surgical consent, monitors and equipment checked, pre-op evaluation, timeout performed and anesthesia consent Lidocaine 1% used for infiltration and patient sedated Hand hygiene performed  and maximum sterile barriers used  Catheter size: 9 Fr Total catheter length 10. MAC introducer Procedure performed using ultrasound guided technique. Ultrasound Notes:anatomy identified, needle tip was noted to be adjacent to the nerve/plexus identified, no ultrasound evidence of intravascular and/or intraneural injection and image(s) printed for medical record Attempts: 1 Following insertion, line sutured, dressing applied and Biopatch. Post procedure assessment: blood return through all ports, free fluid flow and no air  Patient tolerated the procedure well with no immediate complications.

## 2018-01-10 NOTE — Care Management Note (Addendum)
Case Management Note  Patient Details  Name: Allen Cox MRN: [id number] Date of Birth: [birth year]  Subjective/Objective: Pt presented for Chest Pain. S/p cardiac cath 01-03-18 that  revealed-Severe three vessel disease. Plan for CABG 01-08-18. PTA from home with parents. Pt lives in a once level home. PT/OT to evaluate post procedure for disposition recommendations.   Action/Plan: CM will continue to monitor for disposition needs.   Expected Discharge Date:                  Expected Discharge Plan:  Home/Self Care  In-House Referral:     Discharge planning Services  CM Consult  Post Acute Care Choice:    Choice offered to:     DME Arranged:    DME Agency:     HH Arranged:    HH Agency:     Status of Service:  In process, will continue to follow  If discussed at Long Length of Stay Meetings, dates discussed:    Additional Comments: 01/10/2018 Pt is now s/p CABG.  Pt will have 24 hour supervision at discharge provided by family.  Pt confirmed he does not have PCP nor health insurance.  CM made appt at [facility] 6/11 at 2pm.  Pt will need to go to clinic on 5/16 between 8:30 and 11:30 to apply for orange card. CM informed pt and provided info on AVS.  Pt will likely need MATCH at discharge Cherylann Parr, RN 01/10/2018, 10:01 AM

## 2018-01-10 NOTE — Addendum Note (Signed)
Addendum  created 01/10/18 1508 by Val Eagle, MD   Child order released for a procedure order, Intraprocedure Blocks edited, LDA created via procedure documentation, Pend clinical note, Sign clinical note

## 2018-01-10 NOTE — Anesthesia Procedure Notes (Signed)
Central Venous Catheter Insertion Performed by: Val Eagle, MD, anesthesiologist Start/End5/04/2018 7:39 AM, 01/08/2018 7:44 AM Patient location: Pre-op. Preanesthetic checklist: patient identified, IV checked, site marked, risks and benefits discussed, surgical consent, monitors and equipment checked, pre-op evaluation, timeout performed and anesthesia consent Hand hygiene performed  and maximum sterile barriers used  PA cath was placed.Swan type:thermodilution Procedure performed without using ultrasound guided technique. Attempts: 1 Patient tolerated the procedure well with no immediate complications.

## 2018-01-11 ENCOUNTER — Inpatient Hospital Stay: Payer: Self-pay

## 2018-01-11 ENCOUNTER — Inpatient Hospital Stay (HOSPITAL_COMMUNITY): Payer: Self-pay

## 2018-01-11 LAB — GLUCOSE, CAPILLARY
GLUCOSE-CAPILLARY: 128 mg/dL — AB (ref 65–99)
Glucose-Capillary: 144 mg/dL — ABNORMAL HIGH (ref 65–99)
Glucose-Capillary: 148 mg/dL — ABNORMAL HIGH (ref 65–99)
Glucose-Capillary: 158 mg/dL — ABNORMAL HIGH (ref 65–99)
Glucose-Capillary: 167 mg/dL — ABNORMAL HIGH (ref 65–99)
Glucose-Capillary: 169 mg/dL — ABNORMAL HIGH (ref 65–99)

## 2018-01-11 LAB — CBC
HCT: 22.2 % — ABNORMAL LOW (ref 39.0–52.0)
Hemoglobin: 7.2 g/dL — ABNORMAL LOW (ref 13.0–17.0)
MCH: 25.2 pg — ABNORMAL LOW (ref 26.0–34.0)
MCHC: 32.4 g/dL (ref 30.0–36.0)
MCV: 77.6 fL — ABNORMAL LOW (ref 78.0–100.0)
Platelets: 174 10*3/uL (ref 150–400)
RBC: 2.86 MIL/uL — ABNORMAL LOW (ref 4.22–5.81)
RDW: 13.7 % (ref 11.5–15.5)
WBC: 12.8 10*3/uL — ABNORMAL HIGH (ref 4.0–10.5)

## 2018-01-11 LAB — TYPE AND SCREEN
ABO/RH(D): O POS
ANTIBODY SCREEN: NEGATIVE
UNIT DIVISION: 0
Unit division: 0

## 2018-01-11 LAB — BASIC METABOLIC PANEL
Anion gap: 9 (ref 5–15)
BUN: 6 mg/dL (ref 6–20)
CO2: 30 mmol/L (ref 22–32)
Calcium: 8.1 mg/dL — ABNORMAL LOW (ref 8.9–10.3)
Chloride: 96 mmol/L — ABNORMAL LOW (ref 101–111)
Creatinine, Ser: 0.9 mg/dL (ref 0.61–1.24)
GFR calc Af Amer: >60 mL/min (ref >60)
GFR calc non Af Amer: >60 mL/min (ref >60)
Glucose, Bld: 137 mg/dL — ABNORMAL HIGH (ref 65–99)
Potassium: 3.3 mmol/L — ABNORMAL LOW (ref 3.5–5.1)
Sodium: 135 mmol/L (ref 135–145)

## 2018-01-11 LAB — POCT I-STAT, CHEM 8
BUN: 6 mg/dL (ref 6–20)
CREATININE: 0.9 mg/dL (ref 0.61–1.24)
Calcium, Ion: 1.09 mmol/L — ABNORMAL LOW (ref 1.15–1.40)
Chloride: 89 mmol/L — ABNORMAL LOW (ref 101–111)
Glucose, Bld: 171 mg/dL — ABNORMAL HIGH (ref 65–99)
HCT: 26 % — ABNORMAL LOW (ref 39.0–52.0)
Hemoglobin: 8.8 g/dL — ABNORMAL LOW (ref 13.0–17.0)
Potassium: 3.5 mmol/L (ref 3.5–5.1)
SODIUM: 133 mmol/L — AB (ref 135–145)
TCO2: 32 mmol/L (ref 22–32)

## 2018-01-11 LAB — BPAM RBC
BLOOD PRODUCT EXPIRATION DATE: 201905292359
Blood Product Expiration Date: 201905292359
ISSUE DATE / TIME: 201905081101
ISSUE DATE / TIME: 201905081101
UNIT TYPE AND RH: 5100
Unit Type and Rh: 5100

## 2018-01-11 MED ORDER — POTASSIUM CHLORIDE 10 MEQ/50ML IV SOLN
10.0000 meq | INTRAVENOUS | Status: AC
  Administered 2018-01-11: 10 meq via INTRAVENOUS
  Filled 2018-01-11: qty 50

## 2018-01-11 MED ORDER — POTASSIUM CHLORIDE 10 MEQ/50ML IV SOLN
10.0000 meq | INTRAVENOUS | Status: AC
  Administered 2018-01-11 (×3): 10 meq via INTRAVENOUS
  Filled 2018-01-11 (×3): qty 50

## 2018-01-11 MED ORDER — POTASSIUM CHLORIDE 10 MEQ/50ML IV SOLN
10.0000 meq | INTRAVENOUS | Status: AC
  Administered 2018-01-11 (×2): 10 meq via INTRAVENOUS
  Filled 2018-01-11 (×2): qty 50

## 2018-01-11 MED ORDER — SODIUM CHLORIDE 0.9 % IV SOLN
1.0000 g | Freq: Three times a day (TID) | INTRAVENOUS | Status: DC
  Administered 2018-01-11: 1 g via INTRAVENOUS
  Filled 2018-01-11 (×2): qty 1

## 2018-01-11 MED ORDER — AMIODARONE HCL 200 MG PO TABS
400.0000 mg | ORAL_TABLET | Freq: Two times a day (BID) | ORAL | Status: DC
  Administered 2018-01-11 – 2018-01-13 (×6): 400 mg via ORAL
  Filled 2018-01-11 (×7): qty 2

## 2018-01-11 MED ORDER — POTASSIUM CHLORIDE 10 MEQ/50ML IV SOLN
INTRAVENOUS | Status: AC
  Administered 2018-01-11: 10 meq
  Filled 2018-01-11: qty 50

## 2018-01-11 MED ORDER — SODIUM CHLORIDE 0.9 % IV SOLN
1.0000 g | Freq: Three times a day (TID) | INTRAVENOUS | Status: DC
  Administered 2018-01-11 – 2018-01-13 (×5): 1 g via INTRAVENOUS
  Filled 2018-01-11 (×6): qty 1

## 2018-01-11 MED ORDER — POTASSIUM CHLORIDE 10 MEQ/50ML IV SOLN: 10.0000 meq | INTRAVENOUS | Status: DC

## 2018-01-11 MED ORDER — METOLAZONE 5 MG PO TABS
5.0000 mg | ORAL_TABLET | Freq: Every day | ORAL | Status: DC
  Administered 2018-01-11 – 2018-01-12 (×2): 5 mg via ORAL
  Filled 2018-01-11 (×2): qty 1

## 2018-01-11 MED ORDER — POTASSIUM CHLORIDE CRYS ER 20 MEQ PO TBCR
40.0000 meq | EXTENDED_RELEASE_TABLET | Freq: Two times a day (BID) | ORAL | Status: DC
  Administered 2018-01-11 – 2018-01-16 (×11): 40 meq via ORAL
  Filled 2018-01-11 (×11): qty 2

## 2018-01-11 NOTE — Progress Notes (Signed)
3 Days Post-Op Procedure(s) (LRB): CORONARY ARTERY BYPASS GRAFTING (CABG) x five, using Left Internal Mammary Artery and Right Leg Greater Saphenous Vein harvested endoscopically (N/A) TRANSESOPHAGEAL ECHOCARDIOGRAM (TEE) (N/A) INTRA-AORTIC BALLOON PUMP INSERTION (Left) Subjective: Making progress CXR wet but u/o 200/hr Cont milrinone Objective: Vital signs in last 24 hours: Temp:  [98.4 F (36.9 C)-100.1 F (37.8 C)] 98.4 F (36.9 C) (05/11 0734) Pulse Rate:  [99-112] 106 (05/11 0700) Cardiac Rhythm: Sinus tachycardia (05/11 0800) Resp:  [20-33] 24 (05/11 0700) BP: (102-124)/(64-101) 114/101 (05/11 0700) SpO2:  [85 %-96 %] 93 % (05/11 0700) Arterial Line BP: (107-140)/(47-59) 128/52 (05/11 0500) Weight:  [261 lb 1.6 oz (118.4 kg)] 261 lb 1.6 oz (118.4 kg) (05/11 0500)  Hemodynamic parameters for last 24 hours:    Intake/Output from previous day: 05/10 0701 - 05/11 0700 In: 1949.6 [P.O.:440; I.V.:1159.6; IV Piggyback:350] Out: 3835 [Urine:3835] Intake/Output this shift: Total I/O In: 845.2 [P.O.:500; I.V.:195.2; IV Piggyback:150] Out: 1100 [Urine:1100]  Up in chair coarse breath sounds  Lab Results: Recent Labs    01/10/18 0401  01/10/18 1630 01/11/18 0404  WBC 17.2*  --   --  12.8*  HGB 7.8*   < > 7.5* 7.2*  HCT 23.2*   < > 22.0* 22.2*  PLT 156  --   --  174   < > = values in this interval not displayed.   BMET:  Recent Labs    01/10/18 0401  01/10/18 1630 01/11/18 0404  NA 134*   < > 135 135  K 3.5   < > 3.5 3.3*  CL 99*   < > 98* 96*  CO2 26  --   --  30  GLUCOSE 102*   < > 149* 137*  BUN <5*   < > 4* 6  CREATININE 0.95   < > 0.70 0.90  CALCIUM 8.1*  --   --  8.1*   < > = values in this interval not displayed.    PT/INR:  Recent Labs    01/08/18 1632  LABPROT 19.6*  INR 1.67   ABG    Component Value Date/Time   PHART 7.448 01/09/2018 1541   HCO3 21.6 01/09/2018 1541   TCO2 25 01/10/2018 1630   ACIDBASEDEF 2.0 01/09/2018 1541   O2SAT  96.0 01/09/2018 1541   CBG (last 3)  Recent Labs    01/11/18 0010 01/11/18 0352 01/11/18 0733  GLUCAP 148* 144* 128*    Assessment/Plan: S/P Procedure(s) (LRB): CORONARY ARTERY BYPASS GRAFTING (CABG) x five, using Left Internal Mammary Artery and Right Leg Greater Saphenous Vein harvested endoscopically (N/A) TRANSESOPHAGEAL ECHOCARDIOGRAM (TEE) (N/A) INTRA-AORTIC BALLOON PUMP INSERTION (Left) Add metolazone to lasix drip   LOS: 8 days    Allen Cox 01/11/2018

## 2018-01-11 NOTE — Progress Notes (Addendum)
Wrong pt

## 2018-01-11 NOTE — Progress Notes (Signed)
Spoke with RN re PICC will not be placed today.  Will check back in am.

## 2018-01-12 LAB — POCT I-STAT, CHEM 8
BUN: 6 mg/dL (ref 6–20)
CHLORIDE: 82 mmol/L — AB (ref 101–111)
CREATININE: 0.8 mg/dL (ref 0.61–1.24)
Calcium, Ion: 1.1 mmol/L — ABNORMAL LOW (ref 1.15–1.40)
Glucose, Bld: 233 mg/dL — ABNORMAL HIGH (ref 65–99)
HEMATOCRIT: 29 % — AB (ref 39.0–52.0)
Hemoglobin: 9.9 g/dL — ABNORMAL LOW (ref 13.0–17.0)
Potassium: 3.4 mmol/L — ABNORMAL LOW (ref 3.5–5.1)
SODIUM: 131 mmol/L — AB (ref 135–145)
TCO2: 34 mmol/L — AB (ref 22–32)

## 2018-01-12 LAB — BASIC METABOLIC PANEL
ANION GAP: 10 (ref 5–15)
BUN: 6 mg/dL (ref 6–20)
CHLORIDE: 88 mmol/L — AB (ref 101–111)
CO2: 36 mmol/L — AB (ref 22–32)
Calcium: 8.7 mg/dL — ABNORMAL LOW (ref 8.9–10.3)
Creatinine, Ser: 0.96 mg/dL (ref 0.61–1.24)
GFR calc non Af Amer: >60 mL/min (ref >60)
Glucose, Bld: 170 mg/dL — ABNORMAL HIGH (ref 65–99)
POTASSIUM: 3.7 mmol/L (ref 3.5–5.1)
SODIUM: 134 mmol/L — AB (ref 135–145)

## 2018-01-12 LAB — CBC
HEMATOCRIT: 26.9 % — AB (ref 39.0–52.0)
HEMOGLOBIN: 8.5 g/dL — AB (ref 13.0–17.0)
MCH: 24.7 pg — ABNORMAL LOW (ref 26.0–34.0)
MCHC: 31.6 g/dL (ref 30.0–36.0)
MCV: 78.2 fL (ref 78.0–100.0)
Platelets: 302 10*3/uL (ref 150–400)
RBC: 3.44 MIL/uL — AB (ref 4.22–5.81)
RDW: 13.3 % (ref 11.5–15.5)
WBC: 9.7 10*3/uL (ref 4.0–10.5)

## 2018-01-12 LAB — COOXEMETRY PANEL
Carboxyhemoglobin: 1.3 % (ref 0.5–1.5)
Methemoglobin: 1 % (ref 0.0–1.5)
O2 Saturation: 49.3 %
Total hemoglobin: 10.6 g/dL — ABNORMAL LOW (ref 12.0–16.0)

## 2018-01-12 LAB — GLUCOSE, CAPILLARY
Glucose-Capillary: 168 mg/dL — ABNORMAL HIGH (ref 65–99)
Glucose-Capillary: 248 mg/dL — ABNORMAL HIGH (ref 65–99)
Glucose-Capillary: 241 mg/dL — ABNORMAL HIGH (ref 65–99)
Glucose-Capillary: 163 mg/dL — ABNORMAL HIGH (ref 65–99)

## 2018-01-12 LAB — POCT I-STAT 4, (NA,K, GLUC, HGB,HCT)
Glucose, Bld: 182 mg/dL — ABNORMAL HIGH (ref 65–99)
HCT: 28 % — ABNORMAL LOW (ref 39.0–52.0)
Hemoglobin: 9.5 g/dL — ABNORMAL LOW (ref 13.0–17.0)
Potassium: 3.7 mmol/L (ref 3.5–5.1)
SODIUM: 131 mmol/L — AB (ref 135–145)

## 2018-01-12 MED ORDER — SORBITOL 70 % PO SOLN
30.0000 mL | Freq: Every day | ORAL | Status: DC | PRN
  Filled 2018-01-12: qty 30

## 2018-01-12 MED ORDER — POTASSIUM CHLORIDE 10 MEQ/50ML IV SOLN
10.0000 meq | INTRAVENOUS | Status: AC
  Administered 2018-01-12 – 2018-01-13 (×2): 10 meq via INTRAVENOUS
  Filled 2018-01-12 (×2): qty 50

## 2018-01-12 MED ORDER — INSULIN ASPART 100 UNIT/ML ~~LOC~~ SOLN
0.0000 [IU] | Freq: Three times a day (TID) | SUBCUTANEOUS | Status: DC
  Administered 2018-01-12 – 2018-01-13 (×5): 7 [IU] via SUBCUTANEOUS
  Administered 2018-01-14: 4 [IU] via SUBCUTANEOUS
  Administered 2018-01-14: 7 [IU] via SUBCUTANEOUS
  Administered 2018-01-14: 3 [IU] via SUBCUTANEOUS
  Administered 2018-01-15: 4 [IU] via SUBCUTANEOUS
  Administered 2018-01-15 (×2): 3 [IU] via SUBCUTANEOUS
  Administered 2018-01-16: 4 [IU] via SUBCUTANEOUS

## 2018-01-12 MED ORDER — INSULIN DETEMIR 100 UNIT/ML ~~LOC~~ SOLN
20.0000 [IU] | Freq: Two times a day (BID) | SUBCUTANEOUS | Status: DC
  Administered 2018-01-12 – 2018-01-13 (×2): 20 [IU] via SUBCUTANEOUS
  Filled 2018-01-12 (×3): qty 0.2

## 2018-01-12 MED ORDER — POTASSIUM CHLORIDE 10 MEQ/50ML IV SOLN
10.0000 meq | INTRAVENOUS | Status: AC
  Administered 2018-01-12 (×3): 10 meq via INTRAVENOUS
  Filled 2018-01-12 (×3): qty 50

## 2018-01-12 MED ORDER — FUROSEMIDE 10 MG/ML IJ SOLN
40.0000 mg | Freq: Two times a day (BID) | INTRAMUSCULAR | Status: DC
  Administered 2018-01-12 (×2): 40 mg via INTRAVENOUS
  Filled 2018-01-12 (×2): qty 4

## 2018-01-12 MED ORDER — POTASSIUM CHLORIDE 10 MEQ/50ML IV SOLN: 10.0000 meq | INTRAVENOUS | Status: DC | PRN

## 2018-01-12 MED ORDER — POTASSIUM CHLORIDE 10 MEQ/50ML IV SOLN
10.0000 meq | INTRAVENOUS | Status: AC
  Administered 2018-01-12 (×2): 10 meq via INTRAVENOUS
  Filled 2018-01-12 (×2): qty 50

## 2018-01-12 MED ORDER — POTASSIUM CHLORIDE CRYS ER 20 MEQ PO TBCR
20.0000 meq | EXTENDED_RELEASE_TABLET | ORAL | Status: DC | PRN
  Administered 2018-01-12 – 2018-01-13 (×2): 20 meq via ORAL
  Filled 2018-01-12 (×2): qty 1

## 2018-01-12 MED ORDER — INSULIN ASPART 100 UNIT/ML ~~LOC~~ SOLN: 0.0000 [IU] | Freq: Every day | SUBCUTANEOUS | Status: DC

## 2018-01-12 MED ORDER — INSULIN DETEMIR 100 UNIT/ML ~~LOC~~ SOLN
18.0000 [IU] | Freq: Two times a day (BID) | SUBCUTANEOUS | Status: DC
  Filled 2018-01-12: qty 0.18

## 2018-01-12 NOTE — Progress Notes (Signed)
CT surgery p.m. Rounds  Patient had a good day Mixed venous saturation this p.m. 50% so milrinone back to 0.25 2.5 L urine output today

## 2018-01-12 NOTE — Progress Notes (Signed)
Spoke with RN concerning PICC placement today. Patient in chair for breakfast. Will coordinate time with RN.

## 2018-01-12 NOTE — Progress Notes (Signed)
4 Days Post-Op Procedure(s) (LRB): CORONARY ARTERY BYPASS GRAFTING (CABG) x five, using Left Internal Mammary Artery and Right Leg Greater Saphenous Vein harvested endoscopically (N/A) TRANSESOPHAGEAL ECHOCARDIOGRAM (TEE) (N/A) INTRA-AORTIC BALLOON PUMP INSERTION (Left) Subjective: Diuresing, wt close to preop Weaning milrinone- check co-ox Stop lasix drip  Objective: Vital signs in last 24 hours: Temp:  [98.3 F (36.8 C)-98.8 F (37.1 C)] 98.3 F (36.8 C) (05/12 0734) Pulse Rate:  [93-119] 100 (05/12 1000) Cardiac Rhythm: Sinus tachycardia (05/12 0800) Resp:  [14-28] 19 (05/12 1000) BP: (97-125)/(50-78) 104/60 (05/12 0700) SpO2:  [93 %-100 %] 100 % (05/12 1000) Weight:  [235 lb 1.6 oz (106.6 kg)] 235 lb 1.6 oz (106.6 kg) (05/12 0500)  Hemodynamic parameters for last 24 hours:    Intake/Output from previous day: 05/11 0701 - 05/12 0700 In: 1980.8 [P.O.:800; I.V.:580.8; IV Piggyback:600] Out: 9900 [Urine:9900] Intake/Output this shift: Total I/O In: 549.2 [P.O.:360; I.V.:139.2; IV Piggyback:50] Out: -   Incision clean extrem warm  Lab Results: Recent Labs    01/11/18 0404  01/12/18 0016 01/12/18 0450  WBC 12.8*  --   --  9.7  HGB 7.2*   < > 9.5* 8.5*  HCT 22.2*   < > 28.0* 26.9*  PLT 174  --   --  302   < > = values in this interval not displayed.   BMET:  Recent Labs    01/11/18 0404 01/11/18 1642 01/12/18 0016 01/12/18 0450  NA 135 133* 131* 134*  K 3.3* 3.5 3.7 3.7  CL 96* 89*  --  88*  CO2 30  --   --  36*  GLUCOSE 137* 171* 182* 170*  BUN 6 6  --  6  CREATININE 0.90 0.90  --  0.96  CALCIUM 8.1*  --   --  8.7*    PT/INR: No results for input(s): LABPROT, INR in the last 72 hours. ABG    Component Value Date/Time   PHART 7.448 01/09/2018 1541   HCO3 21.6 01/09/2018 1541   TCO2 32 01/11/2018 1642   ACIDBASEDEF 2.0 01/09/2018 1541   O2SAT 96.0 01/09/2018 1541   CBG (last 3)  Recent Labs    01/11/18 1935 01/11/18 2318 01/12/18 0357   GLUCAP 158* 167* 168*    Assessment/Plan: S/P Procedure(s) (LRB): CORONARY ARTERY BYPASS GRAFTING (CABG) x five, using Left Internal Mammary Artery and Right Leg Greater Saphenous Vein harvested endoscopically (N/A) TRANSESOPHAGEAL ECHOCARDIOGRAM (TEE) (N/A) INTRA-AORTIC BALLOON PUMP INSERTION (Left) Wean off mil tomorrow    LOS: 9 days    Allen Cox 01/12/2018

## 2018-01-13 ENCOUNTER — Inpatient Hospital Stay (HOSPITAL_COMMUNITY): Payer: Self-pay

## 2018-01-13 DIAGNOSIS — I2510 Atherosclerotic heart disease of native coronary artery without angina pectoris: Secondary | ICD-10-CM

## 2018-01-13 LAB — CBC
HCT: 27.8 % — ABNORMAL LOW (ref 39.0–52.0)
Hemoglobin: 8.9 g/dL — ABNORMAL LOW (ref 13.0–17.0)
MCH: 24.9 pg — ABNORMAL LOW (ref 26.0–34.0)
MCHC: 32 g/dL (ref 30.0–36.0)
MCV: 77.7 fL — ABNORMAL LOW (ref 78.0–100.0)
Platelets: 384 10*3/uL (ref 150–400)
RBC: 3.58 MIL/uL — ABNORMAL LOW (ref 4.22–5.81)
RDW: 13 % (ref 11.5–15.5)
WBC: 7.8 10*3/uL (ref 4.0–10.5)

## 2018-01-13 LAB — BASIC METABOLIC PANEL
Anion gap: 13 (ref 5–15)
BUN: 5 mg/dL — ABNORMAL LOW (ref 6–20)
CO2: 34 mmol/L — ABNORMAL HIGH (ref 22–32)
Calcium: 8.8 mg/dL — ABNORMAL LOW (ref 8.9–10.3)
Chloride: 86 mmol/L — ABNORMAL LOW (ref 101–111)
Creatinine, Ser: 0.89 mg/dL (ref 0.61–1.24)
GFR calc Af Amer: >60 mL/min (ref >60)
GFR calc non Af Amer: >60 mL/min (ref >60)
Glucose, Bld: 202 mg/dL — ABNORMAL HIGH (ref 65–99)
Potassium: 3.6 mmol/L (ref 3.5–5.1)
Sodium: 133 mmol/L — ABNORMAL LOW (ref 135–145)

## 2018-01-13 LAB — GLUCOSE, CAPILLARY
GLUCOSE-CAPILLARY: 147 mg/dL — AB (ref 65–99)
GLUCOSE-CAPILLARY: 129 mg/dL — AB (ref 65–99)
Glucose-Capillary: 165 mg/dL — ABNORMAL HIGH (ref 65–99)
Glucose-Capillary: 207 mg/dL — ABNORMAL HIGH (ref 65–99)
Glucose-Capillary: 224 mg/dL — ABNORMAL HIGH (ref 65–99)
Glucose-Capillary: 212 mg/dL — ABNORMAL HIGH (ref 65–99)

## 2018-01-13 LAB — COOXEMETRY PANEL
Carboxyhemoglobin: 1.1 % (ref 0.5–1.5)
Methemoglobin: 1.5 % (ref 0.0–1.5)
O2 Saturation: 73.7 %
Total hemoglobin: 9.4 g/dL — ABNORMAL LOW (ref 12.0–16.0)

## 2018-01-13 MED ORDER — METFORMIN HCL 500 MG PO TABS
1000.0000 mg | ORAL_TABLET | Freq: Two times a day (BID) | ORAL | Status: DC
  Administered 2018-01-13 – 2018-01-16 (×7): 1000 mg via ORAL
  Filled 2018-01-13 (×8): qty 2

## 2018-01-13 MED ORDER — FUROSEMIDE 40 MG PO TABS
40.0000 mg | ORAL_TABLET | Freq: Every day | ORAL | Status: DC
  Administered 2018-01-13: 40 mg via ORAL
  Filled 2018-01-13: qty 1

## 2018-01-13 MED ORDER — CARVEDILOL 3.125 MG PO TABS
3.1250 mg | ORAL_TABLET | Freq: Two times a day (BID) | ORAL | Status: DC
  Administered 2018-01-13 – 2018-01-16 (×6): 3.125 mg via ORAL
  Filled 2018-01-13 (×8): qty 1

## 2018-01-13 MED ORDER — INSULIN DETEMIR 100 UNIT/ML ~~LOC~~ SOLN
25.0000 [IU] | Freq: Two times a day (BID) | SUBCUTANEOUS | Status: DC
  Administered 2018-01-13: 25 [IU] via SUBCUTANEOUS
  Filled 2018-01-13 (×3): qty 0.25

## 2018-01-13 NOTE — Plan of Care (Signed)
  Problem: Coping: Goal: Ability to adjust to condition or change in health will improve Outcome: Progressing   Problem: Fluid Volume: Goal: Ability to maintain a balanced intake and output will improve Outcome: Progressing   Problem: Clinical Measurements: Goal: Postoperative complications will be avoided or minimized Outcome: Progressing   Problem: Skin Integrity: Goal: Risk for impaired skin integrity will decrease Outcome: Completed/Met   Problem: Skin Integrity: Goal: Wound healing without signs and symptoms of infection Outcome: Completed/Met   Problem: Urinary Elimination: Goal: Ability to achieve and maintain adequate renal perfusion and functioning will improve Outcome: Completed/Met

## 2018-01-13 NOTE — Plan of Care (Signed)
  Problem: Education: Goal: Understanding of cardiac disease, CV risk reduction, and recovery process will improve Outcome: Progressing Goal: Understanding of medication regimen will improve Outcome: Progressing   Problem: Health Behavior/Discharge Planning: Goal: Ability to safely manage health-related needs after discharge will improve Outcome: Progressing   Problem: Coping: Goal: Ability to adjust to condition or change in health will improve Outcome: Progressing   Problem: Fluid Volume: Goal: Ability to maintain a balanced intake and output will improve Outcome: Progressing   Problem: Tissue Perfusion: Goal: Adequacy of tissue perfusion will improve Outcome: Progressing   Problem: Education: Goal: Ability to demonstrate proper wound care will improve Outcome: Progressing Goal: Knowledge of disease or condition will improve Outcome: Progressing Goal: Knowledge of the prescribed therapeutic regimen will improve Outcome: Progressing   Problem: Cardiac: Goal: Hemodynamic stability will improve Outcome: Progressing   Problem: Urinary Elimination: Goal: Ability to achieve and maintain adequate renal perfusion and functioning will improve Outcome: Progressing   Problem: Activity: Goal: Risk for activity intolerance will decrease Outcome: Adequate for Discharge

## 2018-01-13 NOTE — Progress Notes (Signed)
Inpatient Diabetes Program Recommendations  AACE/ADA: New Consensus Statement on Inpatient Glycemic Control (2015)  Target Ranges:  Prepandial:   less than 140 mg/dL      Peak postprandial:   less than 180 mg/dL (1-2 hours)      Critically ill patients:  140 - 180 mg/dL   Lab Results  Component Value Date   GLUCAP 207 (H) 01/13/2018   HGBA1C 12.7 (H) 01/03/2018    Review of Glycemic Control Results for [name], Allen Cox (MRN [id number]) as of 01/13/2018 10:20  Ref. Range 01/12/2018 03:57 01/12/2018 11:15 01/12/2018 16:38 01/12/2018 21:46 01/13/2018 06:39  Glucose-Capillary Latest Ref Range: 65 - 99 mg/dL 409 (H) 811 (H) 914 (H) 163 (H) 207 (H)   Inpatient Diabetes Program Recommendations:   Noted Metformin starting today. If postprandials remain elevated today, may consider adding Novolog 5 units tid meal coverage if eats 50%  Thank you, Darel Hong E. Kitana Gage, RN, MSN, CDE  Diabetes Coordinator Inpatient Glycemic Control Team Team Pager [phone] (8am-5pm) 01/13/2018 10:21 AM

## 2018-01-13 NOTE — Progress Notes (Signed)
Patient ID: Allen Cox, male   DOB: 04/14/1971, 47 y.o.   MRN: [id number] SICU Evening Rounds:  Hemodynamically stable in sinus rhythm. Milrinone decreased to 0.125 today.  Diuresing well.   Glucose still low 200's. Will increase Levemir further.

## 2018-01-13 NOTE — Progress Notes (Signed)
5 Days Post-Op Procedure(s) (LRB): CORONARY ARTERY BYPASS GRAFTING (CABG) x five, using Left Internal Mammary Artery and Right Leg Greater Saphenous Vein harvested endoscopically (N/A) TRANSESOPHAGEAL ECHOCARDIOGRAM (TEE) (N/A) INTRA-AORTIC BALLOON PUMP INSERTION (Left) Subjective: appetite fair, pain mild  Objective: Vital signs in last 24 hours: Temp:  [98.2 F (36.8 C)-98.9 F (37.2 C)] 98.2 F (36.8 C) (05/13 0400) Pulse Rate:  [96-110] 99 (05/13 0600) Cardiac Rhythm: Sinus tachycardia (05/13 0400) Resp:  [11-24] 20 (05/13 0600) BP: (97-122)/(62-87) 97/62 (05/13 0600) SpO2:  [91 %-100 %] 99 % (05/13 0600) Weight:  [226 lb 6.6 oz (102.7 kg)] 226 lb 6.6 oz (102.7 kg) (05/13 0600)  Hemodynamic parameters for last 24 hours:    Intake/Output from previous day: 05/12 0701 - 05/13 0700 In: 1566 [P.O.:600; I.V.:466; IV Piggyback:500] Out: 4855 [Urine:4855] Intake/Output this shift: No intake/output data recorded.  Neurologic: intact Heart: tachy, regular Lungs: diminished breath sounds bibasilar Extremities: trace edema  Lab Results: Recent Labs    01/12/18 0450 01/12/18 1704 01/13/18 0506  WBC 9.7  --  7.8  HGB 8.5* 9.9* 8.9*  HCT 26.9* 29.0* 27.8*  PLT 302  --  384   BMET:  Recent Labs    01/12/18 0450 01/12/18 1704 01/13/18 0506  NA 134* 131* 133*  K 3.7 3.4* 3.6  CL 88* 82* 86*  CO2 36*  --  34*  GLUCOSE 170* 233* 202*  BUN 6 6 5*  CREATININE 0.96 0.80 0.89  CALCIUM 8.7*  --  8.8*    PT/INR: No results for input(s): LABPROT, INR in the last 72 hours. ABG    Component Value Date/Time   PHART 7.448 01/09/2018 1541   HCO3 21.6 01/09/2018 1541   TCO2 34 (H) 01/12/2018 1704   ACIDBASEDEF 2.0 01/09/2018 1541   O2SAT 73.7 01/13/2018 0506   CBG (last 3)  Recent Labs    01/12/18 1638 01/12/18 2146 01/13/18 0639  GLUCAP 241* 163* 207*    Assessment/Plan: S/P Procedure(s) (LRB): CORONARY ARTERY BYPASS GRAFTING (CABG) x five, using Left Internal  Mammary Artery and Right Leg Greater Saphenous Vein harvested endoscopically (N/A) TRANSESOPHAGEAL ECHOCARDIOGRAM (TEE) (N/A) INTRA-AORTIC BALLOON PUMP INSERTION (Left) -  CV- in Sinus tachy- will start low dose coreg  Co-ox=73- wean milrinone  RESP- continue IS, CXR shows less edema  RENAL- creatinine OK. Will change lasix to PO  ENDO- CBG still elevated- start metformin  Ambulate, SCD for DVT prophylaxis    LOS: 10 days    Loreli Slot 01/13/2018

## 2018-01-13 NOTE — Progress Notes (Signed)
Patient had trouble swallowing pills this am but tolerated well with ice cream with no distress noted. No pain noted or verbalized. Vital signs currently stable. Patient is currently sitting in the bed watching TV. Will continue to monitor.

## 2018-01-14 LAB — CBC
HEMATOCRIT: 29.1 % — AB (ref 39.0–52.0)
HEMOGLOBIN: 9.4 g/dL — AB (ref 13.0–17.0)
MCH: 24.9 pg — ABNORMAL LOW (ref 26.0–34.0)
MCHC: 32.3 g/dL (ref 30.0–36.0)
MCV: 77.2 fL — AB (ref 78.0–100.0)
Platelets: 493 10*3/uL — ABNORMAL HIGH (ref 150–400)
RBC: 3.77 MIL/uL — AB (ref 4.22–5.81)
RDW: 12.9 % (ref 11.5–15.5)
WBC: 9.9 10*3/uL (ref 4.0–10.5)

## 2018-01-14 LAB — BASIC METABOLIC PANEL
Anion gap: 12 (ref 5–15)
BUN: 10 mg/dL (ref 6–20)
CHLORIDE: 90 mmol/L — AB (ref 101–111)
CO2: 29 mmol/L (ref 22–32)
Calcium: 8.8 mg/dL — ABNORMAL LOW (ref 8.9–10.3)
Creatinine, Ser: 1.03 mg/dL (ref 0.61–1.24)
GFR calc Af Amer: >60 mL/min (ref >60)
GFR calc non Af Amer: >60 mL/min (ref >60)
Glucose, Bld: 143 mg/dL — ABNORMAL HIGH (ref 65–99)
POTASSIUM: 3.3 mmol/L — AB (ref 3.5–5.1)
SODIUM: 131 mmol/L — AB (ref 135–145)

## 2018-01-14 LAB — COOXEMETRY PANEL
Carboxyhemoglobin: 1.8 % — ABNORMAL HIGH (ref 0.5–1.5)
Methemoglobin: 0.9 % (ref 0.0–1.5)
O2 Saturation: 65.4 %
Total hemoglobin: 9.9 g/dL — ABNORMAL LOW (ref 12.0–16.0)

## 2018-01-14 LAB — GLUCOSE, CAPILLARY
GLUCOSE-CAPILLARY: 134 mg/dL — AB (ref 65–99)
Glucose-Capillary: 164 mg/dL — ABNORMAL HIGH (ref 65–99)
Glucose-Capillary: 208 mg/dL — ABNORMAL HIGH (ref 65–99)
Glucose-Capillary: 182 mg/dL — ABNORMAL HIGH (ref 65–99)

## 2018-01-14 MED ORDER — SODIUM CHLORIDE 0.9 % IV SOLN: 250.0000 mL | INTRAVENOUS | Status: DC | PRN

## 2018-01-14 MED ORDER — LISINOPRIL 5 MG PO TABS
5.0000 mg | ORAL_TABLET | Freq: Every day | ORAL | Status: DC
Start: 2018-01-14 — End: 2018-01-16
  Administered 2018-01-14 – 2018-01-16 (×3): 5 mg via ORAL
  Filled 2018-01-14 (×3): qty 1

## 2018-01-14 MED ORDER — ALUM & MAG HYDROXIDE-SIMETH 200-200-20 MG/5ML PO SUSP: 15.0000 mL | Freq: Four times a day (QID) | ORAL | Status: DC | PRN

## 2018-01-14 MED ORDER — POTASSIUM CHLORIDE 10 MEQ/50ML IV SOLN: 10.0000 meq | INTRAVENOUS | Status: DC

## 2018-01-14 MED ORDER — POTASSIUM CHLORIDE 10 MEQ/100ML IV SOLN: 10.0000 meq | INTRAVENOUS | Status: DC

## 2018-01-14 MED ORDER — INSULIN DETEMIR 100 UNIT/ML ~~LOC~~ SOLN
30.0000 [IU] | Freq: Two times a day (BID) | SUBCUTANEOUS | Status: DC
  Administered 2018-01-14 – 2018-01-16 (×4): 30 [IU] via SUBCUTANEOUS
  Filled 2018-01-14 (×6): qty 0.3

## 2018-01-14 MED ORDER — SODIUM CHLORIDE 0.9% FLUSH
3.0000 mL | Freq: Two times a day (BID) | INTRAVENOUS | Status: DC
  Administered 2018-01-14 – 2018-01-16 (×3): 3 mL via INTRAVENOUS

## 2018-01-14 MED ORDER — MOVING RIGHT ALONG BOOK
Freq: Once | Status: AC
  Administered 2018-01-14: 22:00:00
  Filled 2018-01-14 (×2): qty 1

## 2018-01-14 MED ORDER — MAGNESIUM HYDROXIDE 400 MG/5ML PO SUSP: 30.0000 mL | Freq: Every day | ORAL | Status: DC | PRN

## 2018-01-14 MED ORDER — SODIUM CHLORIDE 0.9% FLUSH: 3.0000 mL | INTRAVENOUS | Status: DC | PRN

## 2018-01-14 MED ORDER — POTASSIUM CHLORIDE 10 MEQ/50ML IV SOLN
10.0000 meq | INTRAVENOUS | Status: AC
Start: 2018-01-14 — End: 2018-01-14
  Administered 2018-01-14 (×3): 10 meq via INTRAVENOUS
  Filled 2018-01-14 (×4): qty 50

## 2018-01-14 MED ORDER — ZOLPIDEM TARTRATE 5 MG PO TABS: 5.0000 mg | ORAL_TABLET | Freq: Every evening | ORAL | Status: DC | PRN

## 2018-01-14 MED ORDER — AMIODARONE HCL 200 MG PO TABS
200.0000 mg | ORAL_TABLET | Freq: Two times a day (BID) | ORAL | Status: DC
  Administered 2018-01-14 – 2018-01-16 (×5): 200 mg via ORAL
  Filled 2018-01-14 (×5): qty 1

## 2018-01-14 NOTE — Progress Notes (Signed)
Inpatient Diabetes Program Recommendations  AACE/ADA: New Consensus Statement on Inpatient Glycemic Control (2015)  Target Ranges:  Prepandial:   less than 140 mg/dL      Peak postprandial:   less than 180 mg/dL (1-2 hours)      Critically ill patients:  140 - 180 mg/dL   Lab Results  Component Value Date   GLUCAP 208 (H) 01/14/2018   HGBA1C 12.7 (H) 01/03/2018    Spoke with patient @ bedside to followup. Patient had nutrition questions. Patient states willingness to change drinks to sugar free and decrease amount of carbohydrates. Answered questions regarding fruit juices and vegetables. Will continue to follow.  Thank you, Billy Fischer. Harriet Sutphen, RN, MSN, CDE  Diabetes Coordinator Inpatient Glycemic Control Team Team Pager [phone] (8am-5pm) 01/14/2018 3:04 PM

## 2018-01-14 NOTE — Progress Notes (Signed)
6 Days Post-Op Procedure(s) (LRB): CORONARY ARTERY BYPASS GRAFTING (CABG) x five, using Left Internal Mammary Artery and Right Leg Greater Saphenous Vein harvested endoscopically (N/A) TRANSESOPHAGEAL ECHOCARDIOGRAM (TEE) (N/A) INTRA-AORTIC BALLOON PUMP INSERTION (Left) Subjective: "I'm elated" Denies pain and nausea  Objective: Vital signs in last 24 hours: Temp:  [98.6 F (37 C)-100.3 F (37.9 C)] 99.2 F (37.3 C) (05/14 0825) Pulse Rate:  [95-111] 104 (05/14 0800) Cardiac Rhythm: Sinus tachycardia (05/13 2000) Resp:  [0-37] 30 (05/14 0800) BP: (83-136)/(51-108) 113/80 (05/14 0800) SpO2:  [87 %-98 %] 97 % (05/14 0800) Weight:  [225 lb 15.5 oz (102.5 kg)] 225 lb 15.5 oz (102.5 kg) (05/14 0500)  Hemodynamic parameters for last 24 hours:    Intake/Output from previous day: 05/13 0701 - 05/14 0700 In: 835.4 [P.O.:480; I.V.:355.4] Out: 2680 [Urine:2680] Intake/Output this shift: No intake/output data recorded.  General appearance: alert, cooperative and no distress Neurologic: intact Heart: regular rate and rhythm Lungs: clear to auscultation bilaterally Abdomen: normal findings: soft, non-tender Wound: clean and dry  Lab Results: Recent Labs    01/13/18 0506 01/14/18 0500  WBC 7.8 9.9  HGB 8.9* 9.4*  HCT 27.8* 29.1*  PLT 384 493*   BMET:  Recent Labs    01/13/18 0506 01/14/18 0500  NA 133* 131*  K 3.6 3.3*  CL 86* 90*  CO2 34* 29  GLUCOSE 202* 143*  BUN 5* 10  CREATININE 0.89 1.03  CALCIUM 8.8* 8.8*    PT/INR: No results for input(s): LABPROT, INR in the last 72 hours. ABG    Component Value Date/Time   PHART 7.448 01/09/2018 1541   HCO3 21.6 01/09/2018 1541   TCO2 34 (H) 01/12/2018 1704   ACIDBASEDEF 2.0 01/09/2018 1541   O2SAT 65.4 01/14/2018 0610   CBG (last 3)  Recent Labs    01/13/18 1609 01/13/18 2147 01/14/18 0649  GLUCAP 212* 129* 164*    Assessment/Plan: S/P Procedure(s) (LRB): CORONARY ARTERY BYPASS GRAFTING (CABG) x five,  using Left Internal Mammary Artery and Right Leg Greater Saphenous Vein harvested endoscopically (N/A) TRANSESOPHAGEAL ECHOCARDIOGRAM (TEE) (N/A) INTRA-AORTIC BALLOON PUMP INSERTION (Left) Plan for transfer to step-down: see transfer orders  Looks great this AM CV- stable, will continue coreg, ASA, statin, add low dose lisinopril  RESP- continue IS  RENAL- well below preop weight- hold lasix for now  Hypokalemia- supplement  ENDO- CBG better after metformin, but still elevated, adjust levemir  Ambulate     LOS: 11 days    Loreli Slot 01/14/2018

## 2018-01-14 NOTE — Progress Notes (Signed)
Patient ID: Allen Cox, male   DOB: [birth year], 47 y.o.   MRN: [id number] EVENING ROUNDS NOTE :     [address]       [place] [address]             [phone]                 6 Days Post-Op Procedure(s) (LRB): CORONARY ARTERY BYPASS GRAFTING (CABG) x five, using Left Internal Mammary Artery and Right Leg Greater Saphenous Vein harvested endoscopically (N/A) TRANSESOPHAGEAL ECHOCARDIOGRAM (TEE) (N/A) INTRA-AORTIC BALLOON PUMP INSERTION (Left)  Total Length of Stay:  LOS: 11 days  BP (!) 91/44   Pulse 97   Temp 99.2 F (37.3 C) (Oral)   Resp (!) 27   Ht 6' (1.829 m)   Wt 225 lb 15.5 oz (102.5 kg)   SpO2 97%   BMI 30.65 kg/m   .Intake/Output      05/13 0701 - 05/14 0700 05/14 0701 - 05/15 0700   P.O. 480    I.V. (mL/kg) 355.4 (3.5) 74 (0.7)   IV Piggyback  150   Total Intake(mL/kg) 835.4 (8.1) 224 (2.2)   Urine (mL/kg/hr) 2680 (1.1) 450 (0.4)   Stool 0 0   Total Output 2680 450   Net -1844.6 -226        Urine Occurrence 1 x    Stool Occurrence 1 x 1 x     . sodium chloride Stopped (01/10/18 1000)  . sodium chloride Stopped (01/14/18 1624)  . sodium chloride Stopped (01/10/18 1000)  . lactated ringers Stopped (01/14/18 1621)     Lab Results  Component Value Date   WBC 9.9 01/14/2018   HGB 9.4 (L) 01/14/2018   HCT 29.1 (L) 01/14/2018   PLT 493 (H) 01/14/2018   GLUCOSE 143 (H) 01/14/2018   CHOL 214 (H) 01/04/2018   TRIG 96 01/04/2018   HDL 38 (L) 01/04/2018   LDLCALC 157 (H) 01/04/2018   ALT 27 01/08/2018   AST 23 01/08/2018   NA 131 (L) 01/14/2018   K 3.3 (L) 01/14/2018   CL 90 (L) 01/14/2018   CREATININE 1.03 01/14/2018   BUN 10 01/14/2018   CO2 29 01/14/2018   TSH 0.867 01/03/2018   INR 1.67 01/08/2018   HGBA1C 12.7 (H) 01/03/2018   Progressing For transfer to 4e    Delight Ovens MD  Beeper [phone] Office [phone] 01/14/2018 6:04 PM

## 2018-01-14 NOTE — Progress Notes (Signed)
Patient ambulated 721ft on room air this morning. Patient  Tolerated ambulation  well
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
## 2018-01-14 NOTE — Progress Notes (Signed)
Pt ambulated 370 feet on room air. Pt tolerated this well. Will continue to monitor.  Delories Heinz, RN

## 2018-01-15 LAB — BASIC METABOLIC PANEL
Anion gap: 13 (ref 5–15)
BUN: 12 mg/dL (ref 6–20)
CHLORIDE: 93 mmol/L — AB (ref 101–111)
CO2: 24 mmol/L (ref 22–32)
Calcium: 9 mg/dL (ref 8.9–10.3)
Creatinine, Ser: 1.1 mg/dL (ref 0.61–1.24)
GFR calc Af Amer: >60 mL/min (ref >60)
GFR calc non Af Amer: >60 mL/min (ref >60)
GLUCOSE: 157 mg/dL — AB (ref 65–99)
POTASSIUM: 3.8 mmol/L (ref 3.5–5.1)
SODIUM: 130 mmol/L — AB (ref 135–145)

## 2018-01-15 LAB — CBC
HEMATOCRIT: 31.1 % — AB (ref 39.0–52.0)
HEMOGLOBIN: 10 g/dL — AB (ref 13.0–17.0)
MCH: 24.8 pg — AB (ref 26.0–34.0)
MCHC: 32.2 g/dL (ref 30.0–36.0)
MCV: 77 fL — AB (ref 78.0–100.0)
Platelets: 608 10*3/uL — ABNORMAL HIGH (ref 150–400)
RBC: 4.04 MIL/uL — AB (ref 4.22–5.81)
RDW: 12.9 % (ref 11.5–15.5)
WBC: 10 10*3/uL (ref 4.0–10.5)

## 2018-01-15 LAB — GLUCOSE, CAPILLARY
GLUCOSE-CAPILLARY: 151 mg/dL — AB (ref 65–99)
GLUCOSE-CAPILLARY: 144 mg/dL — AB (ref 65–99)
Glucose-Capillary: 136 mg/dL — ABNORMAL HIGH (ref 65–99)
Glucose-Capillary: 108 mg/dL — ABNORMAL HIGH (ref 65–99)

## 2018-01-15 NOTE — Discharge Instructions (Signed)
Endoscopic Saphenous Vein Harvesting, Care After °Refer to this sheet in the next few weeks. These instructions provide you with information about caring for yourself after your procedure. Your health care provider may also give you more specific instructions. Your treatment has been planned according to current medical practices, but problems sometimes occur. Call your health care provider if you have any problems or questions after your procedure. °What can I expect after the procedure? °After the procedure, it is common to have: °· Pain. °· Bruising. °· Swelling. °· Numbness. ° °Follow these instructions at home: °Medicine °· Take over-the-counter and prescription medicines only as told by your health care provider. °· Do not drive or operate heavy machinery while taking prescription pain medicine. °Incision care ° °· Follow instructions from your health care provider about how to take care of the cut made during surgery (incision). Make sure you: °? Wash your hands with soap and water before you change your bandage (dressing). If soap and water are not available, use hand sanitizer. °? Change your dressing as told by your health care provider. °? Leave stitches (sutures), skin glue, or adhesive strips in place. These skin closures may need to be in place for 2 weeks or longer. If adhesive strip edges start to loosen and curl up, you may trim the loose edges. Do not remove adhesive strips completely unless your health care provider tells you to do that. °· Check your incision area every day for signs of infection. Check for: °? More redness, swelling, or pain. °? More fluid or blood. °? Warmth. °? Pus or a bad smell. °General instructions °· Raise (elevate) your legs above the level of your heart while you are sitting or lying down. °· Do any exercises your health care providers have given you. These may include deep breathing, coughing, and walking exercises. °· Do not shower, take baths, swim, or use a hot tub  unless told by your health care provider. °· Wear your elastic stocking if told by your health care provider. °· Keep all follow-up visits as told by your health care provider. This is important. °Contact a health care provider if: °· Medicine does not help your pain. °· Your pain gets worse. °· You have new leg bruises or your leg bruises get bigger. °· You have a fever. °· Your leg feels numb. °· You have more redness, swelling, or pain around your incision. °· You have more fluid or blood coming from your incision. °· Your incision feels warm to the touch. °· You have pus or a bad smell coming from your incision. °Get help right away if: °· Your pain is severe. °· You develop pain, tenderness, warmth, redness, or swelling in any part of your leg. °· You have chest pain. °· You have trouble breathing. °This information is not intended to replace advice given to you by your health care provider. Make sure you discuss any questions you have with your health care provider. °Document Released: 05/02/2011 Document Revised: 01/26/2016 Document Reviewed: 07/04/2015 °Elsevier Interactive Patient Education © 2018 [employer] °Coronary Artery Bypass Grafting, Care After °These instructions give you information on caring for yourself after your procedure. Your doctor may also give you more specific instructions. Call your doctor if you have any problems or questions after your procedure. °Follow these instructions at home: °· Only take medicine as told by your doctor. Take medicines exactly as told. Do not stop taking medicines or start any new medicines without talking to your doctor first. °·   Take your pulse as told by your doctor. °· Do deep breathing as told by your doctor. Use your breathing device (incentive spirometer), if given, to practice deep breathing several times a day. Support your chest with a pillow or your arms when you take deep breaths or cough. °· Keep the area clean, dry, and protected where the  surgery cuts (incisions) were made. Remove bandages (dressings) only as told by your doctor. If strips were applied to surgical area, do not take them off. They fall off on their own. °· Check the surgery area daily for puffiness (swelling), redness, or leaking fluid. °· If surgery cuts were made in your legs: °? Avoid crossing your legs. °? Avoid sitting for long periods of time. Change positions every 30 minutes. °? Raise your legs when you are sitting. Place them on pillows. °· Wear stockings that help keep blood clots from forming in your legs (compression stockings). °· Only take sponge baths until your doctor says it is okay to take showers. Pat the surgery area dry. Do not rub the surgery area with a washcloth or towel. Do not bathe, swim, or use a hot tub until your doctor says it is okay. °· Eat foods that are high in fiber. These include raw fruits and vegetables, whole grains, beans, and nuts. Choose lean meats. Avoid canned, processed, and fried foods. °· Drink enough fluids to keep your pee (urine) clear or pale yellow. °· Weigh yourself every day. °· Rest and limit activity as told by your doctor. You may be told to: °? Stop any activity if you have chest pain, shortness of breath, changes in heartbeat, or dizziness. Get help right away if this happens. °? Move around often for short amounts of time or take short walks as told by your doctor. Gradually become more active. You may need help to strengthen your muscles and build endurance. °? Avoid lifting, pushing, or pulling anything heavier than 10 pounds (4.5 kg) for at least 6 weeks after surgery. °· Do not drive until your doctor says it is okay. °· Ask your doctor when you can go back to work. °· Ask your doctor when you can begin sexual activity again. °· Follow up with your doctor as told. °Contact a doctor if: °· You have puffiness, redness, more pain, or fluid draining from the incision site. °· You have a fever. °· You have puffiness in your  ankles or legs. °· You have pain in your legs. °· You gain 2 or more pounds (0.9 kg) a day. °· You feel sick to your stomach (nauseous) or throw up (vomit). °· You have watery poop (diarrhea). °Get help right away if: °· You have chest pain that goes to your jaw or arms. °· You have shortness of breath. °· You have a fast or irregular heartbeat. °· You notice a "clicking" in your breastbone when you move. °· You have numbness or weakness in your arms or legs. °· You feel dizzy or light-headed. °This information is not intended to replace advice given to you by your health care provider. Make sure you discuss any questions you have with your health care provider. °Document Released: 08/25/2013 Document Revised: 01/26/2016 Document Reviewed: 01/27/2013 °Elsevier Interactive Patient Education © 2017 Elsevier Inc. ° °

## 2018-01-15 NOTE — Discharge Summary (Signed)
Physician Discharge Summary  Patient ID: Allen Cox MRN: [id number] DOB/AGE: [birth year] 47 y.o.  Admit date: 01/03/2018 Discharge date: 01/16/2018  Admission Diagnoses: Non-STEMI/Chest Pain  Discharge Diagnoses:  Active Problems:   NSTEMI (non-ST elevated myocardial infarction) (HCC)   S/P CABG x 5    History of the present illness: The patient is a 47 year old male without significant past medical history and no previous cardiac history who presented with a chief complaint of chest pain.  He has smoked a pipe and cigars on occasion.  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  It was mild at first but became severe while in the waiting room of urgent care.  Additionally he became short of breath and had some nausea and vomited x1.  He was given 2 sublingual nitroglycerin with improvement in his symptoms over approximately 20 minutes and subsequently resolved completely.  He was sent to the [facility] emergency room where he ruled in for non-ST segment elevation myocardial infarction with a troponin of 3.13.  It was found at the time that he had a elevated blood sugar of 343 although he does not have a known history of diabetes.  Cardiology consultation was obtained and he was admitted for further evaluation and treatment.    Patient Active Problem List   Diagnosis Date Noted  . 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 NSTEMI (non-ST elevated myocardial infarction) (HCC) 01/03/2018    Past Medical History:  Diagnosis Date  . Tobacco use          Past Surgical History:  Procedure Laterality Date  . WRIST ARTHROPLASTY Right       Discharged Condition: good  Hospital Course: The patient was admitted to the emergency department and seen by cardiology who felt he would benefit from urgent left heart catheterization and coronary angiography.  He was taken to the Cath Lab where he was found  to have severe coronary artery disease.  Cardiothoracic surgical consultation was obtained with Edwina Barth, MD who evaluated the patient and studies and agreed with recommendations to proceed with coronary artery.  He was medically stabilized and on 01/08/2018 taken to the operating room where he underwent the below described procedure.  Of note the patient did have a V. fib arrest right after coming off bypass requiring prompt defibrillation areas all grafts were inspected and there were no significant findings.  He was started on multiple pressors and in addition an intra-aortic balloon pump was placed.  He was stabilized and taken to the surgical intensive care unit in this stable condition.  Postoperative hospital course:  The patient is overall progressed well.  He is continued on inotropic support and balloon pump was removed on postop day 1.  He was intubated on the ventilator over the night but was able to be extubated on postoperative day #1.  He has remained neurologically intact and has had no further ventricular fibrillation or ectopy.  He was placed on amiodarone drip during the early postoperative period and has been transition to oral dosing.  Drips were weaned over time during the next several days and he remained hemodynamically stable.  He did require a Lasix drip for short-term for postoperative significant volume overload and this has shown steady improvement.  Metaxalone was added for a short-term.  He is diabetic he has been started on metformin and Levemir and blood sugars have been monitored very  closely.  He has been seen by the diabetic coordinator for further education.  This will require aggressive management as an outpatient as his A1c was 12.7.  He is tolerating gradually increasing activities using cardiac rehab modalities.  He has an expected acute blood loss anemia but values have stabilized.  Renal function is within normal limits including BUN of 12 and a creatinine of  1.10.  Blood pressure is well controlled and he has been weaned off oxygen maintaining good saturations on room air.  Incisions are noted to be healing well without evidence of infection.  Is tolerating diet.  At the time of discharge the patient is felt to be quite stable.   Consults: cardiology  Significant Diagnostic Studies: angiography: Cardiac catheterization  Treatments: Surgery OPERATIVE REPORT  DATE OF PROCEDURE:  01/08/2018  PREOPERATIVE DIAGNOSIS:  Three-vessel coronary artery disease, status post non-ST elevation myocardial infarction.  POSTOPERATIVE DIAGNOSIS:  Three-vessel coronary artery disease, status post non-ST elevation myocardial infarction.  PROCEDURES PERFORMED:  Median sternotomy, extracorporeal circulation, coronary artery bypass grafting x5 (left internal mammary artery to LAD, sequential saphenous vein graft to obtuse marginal 1 and 2, saphenous vein graft to posterior descending,  saphenous vein  graft to diagonal), endoscopic vein harvest of right leg, placement of intraaortic balloon pump via left femoral artery.  SURGEON:  Charlett Lango, MD  ASSISTANT:  Gershon Crane  ANESTHESIA:  General.   Discharge Exam: Blood pressure 115/74, pulse 99, temperature 98.4 F (36.9 C), temperature source Oral, resp. rate (!) 23, height 6' (1.829 m), weight 100.3 kg (221 lb 3.2 oz), SpO2 97 %.   General appearance: alert, cooperative and no distress Heart: regular rate and rhythm Lungs: min dim in bases Abdomen: soft, non tender Extremities: no edema Wound: incis healing well   Disposition: Discharge disposition: 01-Home or Self Care       Discharge Instructions    Amb Referral to Cardiac Rehabilitation   Complete by:  As directed    Diagnosis:   CABG NSTEMI     CABG X ___:  5   Discharge patient   Complete by:  As directed    After seen by care manager   Discharge disposition:  01-Home or Self Care   Discharge patient date:  01/16/2018      Allergies as of 01/16/2018   No Known Allergies     Medication List    TAKE these medications   amiodarone 200 MG tablet Commonly known as:  PACERONE Take 1 tablet (200 mg total) by mouth 2 (two) times daily.   aspirin 325 MG EC tablet Take 1 tablet (325 mg total) by mouth daily.   atorvastatin 80 MG tablet Commonly known as:  LIPITOR Take 1 tablet (80 mg total) by mouth at bedtime.   carvedilol 3.125 MG tablet Commonly known as:  COREG Take 1 tablet (3.125 mg total) by mouth 2 (two) times daily with a meal.   insulin detemir 100 UNIT/ML injection Commonly known as:  LEVEMIR Inject 0.3 mLs (30 Units total) into the skin 2 (two) times daily.   lisinopril 5 MG tablet Commonly known as:  PRINIVIL,ZESTRIL Take 1 tablet (5 mg total) by mouth daily.   metFORMIN 1000 MG tablet Commonly known as:  GLUCOPHAGE Take 1 tablet (1,000 mg total) by mouth 2 (two) times daily with a meal.   oxyCODONE 5 MG immediate release tablet Commonly known as:  Oxy IR/ROXICODONE Take 1-2 tablets (5-10 mg total) by mouth every 6 (six) hours as needed for  up to 7 days for severe pain.      Follow-up Information    [facility] Follow up on 02/11/2018.   Why:  Primary Care Doctor Appt 6/11 at 2pm.  You will also need to go to the clinic on 6/20 between the hours of 8:30am -11:30 am to apply for orange card (this card will provide both copay and medication discounts) Contact information: [address] [place] [address] [phone]       Tonny Bollman, MD Follow up.   Specialty:  Cardiology Why:  Please see discharge paperwork for 2-week cardiology follow-up appointment. Contact information: [address] [place] [address] [phone]        Tonny Bollman, MD .   Specialty:  Cardiology Contact information: [address] [place] [address] [phone]        Loreli Slot, MD Follow  up.   Specialty:  Cardiothoracic Surgery Why:  Appointment to see Dr. Dorris Fetch on 02/11/2018 at 11:45 AM.  Please obtain a chest x-ray at [facility] imaging at 11:15 AM.  [facility] imaging is located in the same office complex on the first floor. Contact information: [address] [employer] [address] [place] [address] [phone]         The patient has been discharged on:   1.Beta Blocker:  Yes [ y  ]                              No   [   ]                              If No, reason:  2.Ace Inhibitor/ARB: Yes [ y  ]                                     No  [    ]                                     If No, reason:  3.Statin:   Yes [ y  ]                  No  [   ]                  If No, reason:  4.Ecasa:  Yes  [place].Etienne   ]                  No   [   ]                  If No, reason:   Signed: Glenice Laine Joann Kulpa 01/16/2018, 8:49 AM

## 2018-01-15 NOTE — Progress Notes (Signed)
CARDIAC REHAB PHASE I   PRE:  Rate/Rhythm: 90 SR    BP: sitting 98/67    SaO2: 97 RA  MODE:  Ambulation: 700 ft   POST:  Rate/Rhythm: 114 ST    BP: sitting 116/73     SaO2: 100 RA  Pt moved out of bed easily although did need to be reminded of sternal precautions when moving to EOB. Slow pace, wobbly at times but able to compensate. No c/o but tired after walk. To recliner. Pt is very flat. Sts he is aware of all the events he has had and they are taking a toll. Discussed that he is progressing well now. Ed completed. Will send referral to [facility]. He is to read diets and watch videos and walk x2 more today.  [id number]   Harriet Masson CES, ACSM 01/15/2018 10:46 AM

## 2018-01-15 NOTE — Progress Notes (Addendum)
      [address]       [place] [address]             [phone]      7 Days Post-Op Procedure(s) (LRB): CORONARY ARTERY BYPASS GRAFTING (CABG) x five, using Left Internal Mammary Artery and Right Leg Greater Saphenous Vein harvested endoscopically (N/A) TRANSESOPHAGEAL ECHOCARDIOGRAM (TEE) (N/A) INTRA-AORTIC BALLOON PUMP INSERTION (Left)   Subjective:  No new complaints.  States a little bit of a rough night with getting moved up here.  + ambulation  + BM  Objective: Vital signs in last 24 hours: Temp:  [98.3 F (36.8 C)-99.2 F (37.3 C)] 98.8 F (37.1 C) (05/15 0251) Pulse Rate:  [93-105] 99 (05/15 0251) Cardiac Rhythm: Normal sinus rhythm (05/15 0700) Resp:  [17-35] 23 (05/15 0251) BP: (91-129)/(44-107) 121/78 (05/15 0251) SpO2:  [94 %-100 %] 97 % (05/15 0251) Weight:  [220 lb 9.6 oz (100.1 kg)-220 lb 10.9 oz (100.1 kg)] 220 lb 10.9 oz (100.1 kg) (05/15 0500)  Intake/Output from previous day: 05/14 0701 - 05/15 0700 In: 1344 [P.O.:1120; I.V.:74; IV Piggyback:150] Out: 950 [Urine:950]  General appearance: alert, cooperative and no distress Heart: regular rate and rhythm Lungs: clear to auscultation bilaterally Abdomen: soft, non-tender; bowel sounds normal; no masses,  no organomegaly Extremities: edema trace Wound: clean and dry  Lab Results: Recent Labs    01/14/18 0500 01/15/18 0326  WBC 9.9 10.0  HGB 9.4* 10.0*  HCT 29.1* 31.1*  PLT 493* 608*   BMET:  Recent Labs    01/14/18 0500 01/15/18 0326  NA 131* 130*  K 3.3* 3.8  CL 90* 93*  CO2 29 24  GLUCOSE 143* 157*  BUN 10 12  CREATININE 1.03 1.10  CALCIUM 8.8* 9.0    PT/INR: No results for input(s): LABPROT, INR in the last 72 hours. ABG    Component Value Date/Time   PHART 7.448 01/09/2018 1541   HCO3 21.6 01/09/2018 1541   TCO2 34 (H) 01/12/2018 1704   ACIDBASEDEF 2.0 01/09/2018 1541   O2SAT 65.4 01/14/2018 0610   CBG (last 3)  Recent Labs    01/14/18 1707  01/14/18 2044 01/15/18 0557  GLUCAP 134* 182* 151*    Assessment/Plan: S/P Procedure(s) (LRB): CORONARY ARTERY BYPASS GRAFTING (CABG) x five, using Left Internal Mammary Artery and Right Leg Greater Saphenous Vein harvested endoscopically (N/A) TRANSESOPHAGEAL ECHOCARDIOGRAM (TEE) (N/A) INTRA-AORTIC BALLOON PUMP INSERTION (Left)  1. CV- NSR, BP controlled- continue Coreg and Lisinopril- will d/c EPW today 2. Pulm- no acute issues, continue IS 3. Renal- creatinine remains stable, weight is below baseline, no lasix indicated at this time 4. DM- preop A1c was 12.7, ? New diagnosis, continue SSIP and Metformin, Levemir 5. Dispo- patient stable, will d/c EPW today, consult care management for discharge assistance with arrangement of PCP, likely medication assistance with no insurance and will require multiple medications at discharge, if remains clinically stable possibly ready for d/c in next 24-48 hours   LOS: 12 days    Lowella Dandy 01/15/2018 Patient seen and examined, agree with above Previously undiagnosed type II diabetes  Viviann Spare C. Dorris Fetch, MD [facility] [phone]

## 2018-01-15 NOTE — Progress Notes (Signed)
Pacing wires removed per order. Patient instructed on bedrest and vital sign protocol initiated. 

## 2018-01-16 ENCOUNTER — Encounter (HOSPITAL_COMMUNITY): Payer: Self-pay | Admitting: Thoracic Surgery (Cardiothoracic Vascular Surgery)

## 2018-01-16 DIAGNOSIS — E785 Hyperlipidemia, unspecified: Secondary | ICD-10-CM

## 2018-01-16 DIAGNOSIS — I4901 Ventricular fibrillation: Secondary | ICD-10-CM

## 2018-01-16 DIAGNOSIS — I255 Ischemic cardiomyopathy: Secondary | ICD-10-CM

## 2018-01-16 LAB — GLUCOSE, CAPILLARY
GLUCOSE-CAPILLARY: 185 mg/dL — AB (ref 65–99)
Glucose-Capillary: 178 mg/dL — ABNORMAL HIGH (ref 65–99)

## 2018-01-16 MED ORDER — ATORVASTATIN CALCIUM 80 MG PO TABS: 80.0000 mg | ORAL_TABLET | Freq: Every day | ORAL | 1 refills | Status: DC

## 2018-01-16 MED ORDER — ASPIRIN 325 MG PO TBEC: 325.0000 mg | DELAYED_RELEASE_TABLET | Freq: Every day | ORAL | Status: DC

## 2018-01-16 MED ORDER — AMIODARONE HCL 200 MG PO TABS: 200.0000 mg | ORAL_TABLET | Freq: Two times a day (BID) | ORAL | 1 refills | Status: DC

## 2018-01-16 MED ORDER — LISINOPRIL 5 MG PO TABS: 5.0000 mg | ORAL_TABLET | Freq: Every day | ORAL | 1 refills | Status: DC

## 2018-01-16 MED ORDER — INSULIN DETEMIR 100 UNIT/ML ~~LOC~~ SOLN: 30.0000 [IU] | Freq: Two times a day (BID) | SUBCUTANEOUS | 11 refills | Status: DC

## 2018-01-16 MED ORDER — OXYCODONE HCL 5 MG PO TABS: 5.0000 mg | ORAL_TABLET | Freq: Four times a day (QID) | ORAL | 0 refills | Status: AC | PRN

## 2018-01-16 MED ORDER — METFORMIN HCL 1000 MG PO TABS: 1000.0000 mg | ORAL_TABLET | Freq: Two times a day (BID) | ORAL | 1 refills | Status: DC

## 2018-01-16 MED ORDER — CARVEDILOL 3.125 MG PO TABS: 3.1250 mg | ORAL_TABLET | Freq: Two times a day (BID) | ORAL | 1 refills | Status: DC

## 2018-01-16 NOTE — Progress Notes (Signed)
Progress Note  Patient Name: Allen Cox Date of Encounter: 01/16/2018  Primary Cardiologist: Tonny Bollman, MD   7 Days Post-Op Procedure(s) (LRB): CORONARY ARTERY BYPASS GRAFTING (CABG) x five, using Left Internal Mammary Artery and Right Leg Greater Saphenous Vein harvested endoscopically (N/A) TRANSESOPHAGEAL ECHOCARDIOGRAM (TEE) (N/A) INTRA-AORTIC BALLOON PUMP INSERTION (Left)    Subjective   Feels well.  No complaints.  Ambulating without difficulty. No anginal chest pain or dyspnea with exertion. Ready to go home   Inpatient Medications    Scheduled Meds: . amiodarone  200 mg Oral BID  . aspirin EC  325 mg Oral Daily   Or  . aspirin  324 mg Per Tube Daily  . atorvastatin  80 mg Oral QHS  . bisacodyl  10 mg Oral Daily   Or  . bisacodyl  10 mg Rectal Daily  . carvedilol  3.125 mg Oral BID WC  . docusate sodium  200 mg Oral Daily  . insulin aspart  0-20 Units Subcutaneous TID WC  . insulin aspart  0-5 Units Subcutaneous QHS  . insulin detemir  30 Units Subcutaneous BID  . lisinopril  5 mg Oral Daily  . mouth rinse  15 mL Mouth Rinse BID  . metFORMIN  1,000 mg Oral BID WC  . pantoprazole  40 mg Oral Daily  . potassium chloride  40 mEq Oral BID  . sodium chloride flush  3 mL Intravenous Q12H   Continuous Infusions: . sodium chloride Stopped (01/14/18 2054)   PRN Meds: sodium chloride, alum & mag hydroxide-simeth, magnesium hydroxide, metoprolol tartrate, ondansetron (ZOFRAN) IV, oxyCODONE, potassium chloride, sodium chloride flush, sorbitol, traMADol, zolpidem   Vital Signs    Vitals:   01/15/18 0251 01/15/18 0500 01/15/18 1952 01/16/18 0405  BP: 121/78  115/74   Pulse: 99     Resp: (!) 23     Temp: 98.8 F (37.1 C)  98.6 F (37 C) 98.4 F (36.9 C)  TempSrc: Oral  Oral Oral  SpO2: 97%     Weight:  220 lb 10.9 oz (100.1 kg)  221 lb 3.2 oz (100.3 kg)  Height:        Intake/Output Summary (Last 24 hours) at 01/16/2018 1150 Last data filed at  01/16/2018 0721 Gross per 24 hour  Intake 480 ml  Output -  Net 480 ml   Filed Weights   01/14/18 2027 01/15/18 0500 01/16/18 0405  Weight: 220 lb 9.6 oz (100.1 kg) 220 lb 10.9 oz (100.1 kg) 221 lb 3.2 oz (100.3 kg)    Telemetry    NSR - Personally Reviewed  ECG    n/a - Personally Reviewed  Physical Exam   GEN: No acute distress.   Neck: No JVD Cardiac: RRR, no murmurs, rubs, or gallops.  Respiratory: Clear to auscultation bilaterally. MS: No edema; No deformity.  Psych: Normal affect   Labs    Chemistry Recent Labs  Lab 01/13/18 0506 01/14/18 0500 01/15/18 0326  NA 133* 131* 130*  K 3.6 3.3* 3.8  CL 86* 90* 93*  CO2 34* 29 24  GLUCOSE 202* 143* 157*  BUN 5* 10 12  CREATININE 0.89 1.03 1.10  CALCIUM 8.8* 8.8* 9.0  GFRNONAA >60 >60 >60  GFRAA >60 >60 >60  ANIONGAP Hematology Recent Labs  Lab 01/13/18 0506 01/14/18 0500 01/15/18 0326  WBC 7.8 9.9 10.0  RBC 3.58* 3.77* 4.04*  HGB 8.9* 9.4* 10.0*  HCT 27.8* 29.1* 31.1*  MCV 77.7*  77.2* 77.0*  MCH 24.9* 24.9* 24.8*  MCHC 32.0 32.3 32.2  RDW 13.0 12.9 12.9  PLT 384 493* 608*    Cardiac EnzymesNo results for input(s): TROPONINI in the last 168 hours. No results for input(s): TROPIPOC in the last 168 hours.   BNPNo results for input(s): BNP, PROBNP in the last 168 hours.   DDimer No results for input(s): DDIMER in the last 168 hours.   Radiology    No results found.  Cardiac Studies    Cardiac cath on 5/03//2019: Prox- mid LAD 95%.  D1 60%.  Proximal-mid circumflex 75%, OM1 ostial 75%, OM 2 ostial 40%.  Mid RCA 60% and PAD 100%.  EF estimated 25 to 35%.  No Aortic stenosis   Transthoracic Echo 01/04/2018: EF 30 to 35% with moderate reduced EF.  Mid anterior, basal-mid anteroseptal, apical septal apical akinesis.  Severe HK of apical anterior mid anterolateral wall hypokinesis of the basal mid inferolateral wall and a moderately sedated for septal apical inferior wall.  Mild to  moderate MR.  CABG x5 (LIMA-LAD, Seq SVG-OM 2-OM1, SVG-PDA, SVG-Diag) ; postop cardiac arrest, IABP placed.  Patient Profile     47 y.o. male history of my tobacco use and undiagnosed diabetes prior to this hospitalization who presented with non-ST elevation MI. Cardiac catheterization revealed multivessel -recommendation was CABG.   He underwent CABG which initially went smoothly, but initially postop he had V. tach requiring shock/CPR and initiation of amiodarone.  Assessment & Plan    1. NSTEMI/multivessel CAD: peaked at 54.25. Cath with 3v disease noted. S/p CABG on 5/8.   On aspirin.  Would like to start Plavix on discharge (vs. Start on OP f/u with [facility])  On beta-blocker and ACE inhibitor.  Blood pressure not tolerated further adjustment.  On high-dose statin  2. ICM: EF noted at 30-35% on echo..   Would also want to reassess EF (can order Echo @ cardiology follow-up) -> to determine potential need for LifeVest.  Tolerating carvedilol and lisinopril.  Not currently on diuretic.  3. DM: Hgb A1c 12.7 on admission. He was not seeing a PCP on a regular basis as outpatient. CBGs now better controlled. On SSI along with Lantus 20units daily. Diabetes coordinator consulted.   4.  Postop VT.  Currently on amiodarone with no recurrence.  Would plan to continue amiodarone for the first month per discussion with Dr. Orson Aloe (is currently in the loading phase of 200 mg twice daily which should be for 2 weeks followed by 200 mg daily to complete 1 month) -reassess at that time.    5. HL: LDL 157. On high dose atorvastatin.     He Is stable for discharge from a CT surgical standpoint. Cardiology follow-up set up with Mr. Iver Nestle, [place] on 5/30.       For questions or updates, please contact [facility] Please consult [email or web address] for contact info under Cardiology/STEMI.      Signed, Bryan Lemma, MD  01/16/2018, 11:50 AM

## 2018-01-16 NOTE — Addendum Note (Signed)
Addendum  created 01/16/18 1433 by Lev Cervone D, MD   Intraprocedure Event edited, Intraprocedure Staff edited    

## 2018-01-16 NOTE — Care Management Note (Signed)
Case Management Note Previous CM note completed by Cherylann Parr, RN 01/10/2018, 10:01 AM   Patient Details  Name: Allen Cox MRN: [id number] Date of Birth: [birth year]  Subjective/Objective: Pt presented for Chest Pain. S/p cardiac cath 01-03-18 that  revealed-Severe three vessel disease. Plan for CABG 01-08-18. PTA from home with parents. Pt lives in a once level home. PT/OT to evaluate post procedure for disposition recommendations.   Action/Plan: CM will continue to monitor for disposition needs.   Expected Discharge Date:  01/16/18               Expected Discharge Plan:  Home w Home Health Services  In-House Referral:  NA  Discharge planning Services  CM Consult, Medication Assistance, Follow-up appt scheduled, Indigent Health Clinic, [facility]  Post Acute Care Choice:  NA Choice offered to:  NA  DME Arranged:    DME Agency:  NA  HH Arranged:  RN, Disease Management HH Agency:  [facility]  Status of Service:  Completed, signed off  If discussed at Long Length of Stay Meetings, dates discussed:  5/16  Discharge Disposition: home/home health   Additional Comments:  01/16/18- 1100- Nalayah Hitt RN, CM- pt for d/c home today- has been set up for PCP needs at the [facility] appoint on June 11 with Dr. Delrae Alfred- pt will need to go for [facility] eligibility on June 20- spoke with pt at bedside regarding clinic f/u discussed with pt what he needs to do and orange card. Pt informed CM that FC had just been in to see him regarding Medicaid and Disability. CM also reviewed pt's medications- pt has been placed on Levimer which pt will need to apply for pt assistance or be changed over to cheaper insulin- pt assistance application provided to pt along with info on application process if needed pt to discuss with MD at clinic- Pt provided [facility] letter for medication assistance at discharge- explained copay cost $3 per script which pt states he can  afford- will include pain meds into MATCH copay for pt. Pt also provided list of pharmacies to use with MATCH letter. Pt also discussed in [facility]/QC mtg this AM and made HRI with [facility] for close f/u.- discussed with pt who is agreeable- notified Lupita Leash with [facility] who will assess pt for charity care- HHRN.   01/15/18- 1500- Lalitha Ilyas RN, CM-  Noted pt to go to [facility] clinic for orange card eligibility tomorrow- call made to clinic- pt will need to come June 20 if unable to make eligibility on May 16 (Clinic does orange card eligibility every 3rd Thursday of the month from 8:30-11:30)  Cherylann Parr, RN 01/10/2018, 10:01 AM-- Pt is now s/p CABG.  Pt will have 24 hour supervision at discharge provided by family.  Pt confirmed he does not have PCP nor health insurance.  CM made appt at [facility] 6/11 at 2pm.  Pt will need to go to clinic on 5/16 between 8:30 and 11:30 to apply for orange card. CM informed pt and provided info on AVS.  Pt will likely need MATCH at discharge   Darrold Span, RN 01/16/2018, 11:01 AM [phone] 4E Transition Care Coordinator

## 2018-01-16 NOTE — Progress Notes (Signed)
Ashok Norris to be D/C'd Home per MD order. Discussed with the patient and all questions fully answered.    IV catheter discontinued intact. Site without signs and symptoms of complications. Dressing and pressure applied.  An After Visit Summary was printed and given to the patient.  Patient escorted via WC, and D/C home via private auto.  Kai Levins  01/16/2018 11:54 AM

## 2018-01-16 NOTE — Progress Notes (Addendum)
[address]       [place] [address]             [phone]      8 Days Post-Op Procedure(s) (LRB): CORONARY ARTERY BYPASS GRAFTING (CABG) x five, using Left Internal Mammary Artery and Right Leg Greater Saphenous Vein harvested endoscopically (N/A) TRANSESOPHAGEAL ECHOCARDIOGRAM (TEE) (N/A) INTRA-AORTIC BALLOON PUMP INSERTION (Left) Subjective:  Feels well, anxious to go home  Objective: Vital signs in last 24 hours: Temp:  [98.4 F (36.9 C)-98.6 F (37 C)] 98.4 F (36.9 C) (05/16 0405) Cardiac Rhythm: Normal sinus rhythm (05/16 0728) BP: (115)/(74) 115/74 (05/15 1952) Weight:  [100.3 kg (221 lb 3.2 oz)] 100.3 kg (221 lb 3.2 oz) (05/16 0405)  Hemodynamic parameters for last 24 hours:    Intake/Output from previous day: 05/15 0701 - 05/16 0700 In: 240 [P.O.:240] Out: -  Intake/Output this shift: Total I/O In: 240 [P.O.:240] Out: -   General appearance: alert, cooperative and no distress Heart: regular rate and rhythm Lungs: min dim in bases Abdomen: soft, non tender Extremities: no edema Wound: incis healing well  Lab Results: Recent Labs    01/14/18 0500 01/15/18 0326  WBC 9.9 10.0  HGB 9.4* 10.0*  HCT 29.1* 31.1*  PLT 493* 608*   BMET:  Recent Labs    01/14/18 0500 01/15/18 0326  NA 131* 130*  K 3.3* 3.8  CL 90* 93*  CO2 29 24  GLUCOSE 143* 157*  BUN 10 12  CREATININE 1.03 1.10  CALCIUM 8.8* 9.0    PT/INR: No results for input(s): LABPROT, INR in the last 72 hours. ABG    Component Value Date/Time   PHART 7.448 01/09/2018 1541   HCO3 21.6 01/09/2018 1541   TCO2 34 (H) 01/12/2018 1704   ACIDBASEDEF 2.0 01/09/2018 1541   O2SAT 65.4 01/14/2018 0610   CBG (last 3)  Recent Labs    01/15/18 1637 01/15/18 2046 01/16/18 0606  GLUCAP 144* 108* 185*    Meds Scheduled Meds: . amiodarone  200 mg Oral BID  . aspirin EC  325 mg Oral Daily   Or  . aspirin  324 mg Per Tube Daily  . atorvastatin  80 mg Oral QHS    . bisacodyl  10 mg Oral Daily   Or  . bisacodyl  10 mg Rectal Daily  . carvedilol  3.125 mg Oral BID WC  . docusate sodium  200 mg Oral Daily  . insulin aspart  0-20 Units Subcutaneous TID WC  . insulin aspart  0-5 Units Subcutaneous QHS  . insulin detemir  30 Units Subcutaneous BID  . lisinopril  5 mg Oral Daily  . mouth rinse  15 mL Mouth Rinse BID  . metFORMIN  1,000 mg Oral BID WC  . pantoprazole  40 mg Oral Daily  . potassium chloride  40 mEq Oral BID  . sodium chloride flush  3 mL Intravenous Q12H   Continuous Infusions: . sodium chloride Stopped (01/14/18 2054)   PRN Meds:.sodium chloride, alum & mag hydroxide-simeth, magnesium hydroxide, metoprolol tartrate, ondansetron (ZOFRAN) IV, oxyCODONE, potassium chloride, sodium chloride flush, sorbitol, traMADol, zolpidem  Xrays No results found.  Assessment/Plan: S/P Procedure(s) (LRB): CORONARY ARTERY BYPASS GRAFTING (CABG) x five, using Left Internal Mammary Artery and Right Leg Greater Saphenous Vein harvested endoscopically (N/A) TRANSESOPHAGEAL ECHOCARDIOGRAM (TEE) (N/A) INTRA-AORTIC BALLOON PUMP INSERTION (Left)  1 doing well, hemodyn stable in sinus rhythm, BP well controlled 2 he is comfortable with checking CBG's and  giving his insulin 3 wants to speak with case management, hopefully can help with meds and finding primary care MD 4 d/c chest tube sutures 5 no new labs  LOS: 13 days    Wayne ERowe Clack6/2019 Patient seen and examined, agree with above Home once arrangements complete  Viviann Spare C. Dorris Fetch, MD [facility] [phone]

## 2018-01-16 NOTE — Progress Notes (Signed)
CARDIAC REHAB PHASE I   PRE:  Rate/Rhythm: 88 sinus rhythm  BP:  Supine:   Sitting: 104/71  Standing:    SaO2: 97% ra   MODE:  Ambulation: 800 ft   POST:  Rate/Rhythem: 104 sinus tachycardia  BP:  Supine:   Sitting: 124/67  Standing:    SaO2: 99% ra  0845-0930   Pt ambulated in hallway x 1 assist.  Steady gait, asymptomatic.  Pt returned to chair, call light in reach.  Education reinforced including exercise guidelines, sternal precautions, medication compliance.  Pt interested in CRPII in [place].  Pt looking forward to discharge home.  Deveron Furlong, RN, BSN Cardiac Pulmonary Rehab   [facility]

## 2018-01-21 ENCOUNTER — Other Ambulatory Visit: Payer: Self-pay

## 2018-01-21 MED ORDER — BLOOD GLUCOSE MONITOR KIT: PACK | 1 refills | Status: DC

## 2018-01-30 ENCOUNTER — Ambulatory Visit (INDEPENDENT_AMBULATORY_CARE_PROVIDER_SITE_OTHER): Payer: Self-pay | Admitting: Physician Assistant

## 2018-01-30 ENCOUNTER — Encounter: Payer: Self-pay | Admitting: Physician Assistant

## 2018-01-30 VITALS — BP 100/58 | HR 104 | Ht 72.0 in | Wt 230.0 lb

## 2018-01-30 DIAGNOSIS — E118 Type 2 diabetes mellitus with unspecified complications: Secondary | ICD-10-CM

## 2018-01-30 DIAGNOSIS — Z794 Long term (current) use of insulin: Secondary | ICD-10-CM

## 2018-01-30 DIAGNOSIS — E785 Hyperlipidemia, unspecified: Secondary | ICD-10-CM

## 2018-01-30 DIAGNOSIS — I255 Ischemic cardiomyopathy: Secondary | ICD-10-CM

## 2018-01-30 DIAGNOSIS — I472 Ventricular tachycardia, unspecified: Secondary | ICD-10-CM

## 2018-01-30 DIAGNOSIS — I25700 Atherosclerosis of coronary artery bypass graft(s), unspecified, with unstable angina pectoris: Secondary | ICD-10-CM

## 2018-01-30 MED ORDER — CLOPIDOGREL BISULFATE 75 MG PO TABS: 75.0000 mg | ORAL_TABLET | Freq: Every day | ORAL | 3 refills | Status: DC

## 2018-01-30 MED ORDER — AMIODARONE HCL 200 MG PO TABS: 200.0000 mg | ORAL_TABLET | Freq: Every day | ORAL | 3 refills | Status: DC

## 2018-01-30 MED ORDER — LISINOPRIL 2.5 MG PO TABS: 2.5000 mg | ORAL_TABLET | Freq: Every day | ORAL | 3 refills | Status: DC

## 2018-01-30 MED ORDER — ASPIRIN EC 81 MG PO TBEC: 81.0000 mg | DELAYED_RELEASE_TABLET | Freq: Every day | ORAL | 3 refills | Status: AC

## 2018-01-30 MED ORDER — CARVEDILOL 6.25 MG PO TABS: 6.2500 mg | ORAL_TABLET | Freq: Two times a day (BID) | ORAL | 3 refills | Status: DC

## 2018-01-30 NOTE — Progress Notes (Signed)
Cardiology Office Note    Date:  01/30/2018   ID:  Allen Cox, DOB 06/26/1971, MRN [id number]  PCP:  Patient, No Pcp Per  Cardiologist:  Dr. Burt Knack  Chief Complaint: Hospital follow up History of Present Illness:   Allen Cox is a 47 y.o. male presents for hospital follow up.   Recently admitted for NSTEMI with prior hx only significant for tobacco abuse. Cardiac catheterization revealed multivessel. He underwent CABG x 5 which initially went smoothly, but initially postop he had V. tach requiring shock/CPR and initiation of amiodarone>> plan to reduce to 227m qd at follow up and then stop in 1 month per discussion with Dr. HKoleen Nimrod  EF noted at 30-35% on echo. Started on BB and ACE. Also started on medication for new dx of DM.   Here today for follow up.  He never started aspirin 325 mg.  He denies exertional chest pressure or shortness of breath.  Currently walks about 15 minutes a day.  He has mild chest soreness from surgery.  No palpitation, orthopnea, PND, syncope, melena or blood in his stool or urine.  Compliant with low-sodium diet.  Past Medical History:  Diagnosis Date  . CAD (coronary artery disease) of bypass graft   . [place]   . HLD (hyperlipidemia)   . Ischemic cardiomyopathy   . Tobacco use   . [place] [facility]     Past Surgical History:  Procedure Laterality Date  . CORONARY ARTERY BYPASS GRAFT N/A 01/08/2018   Procedure: CORONARY ARTERY BYPASS GRAFTING (CABG) x five, using Left Internal Mammary Artery and Right Leg Greater Saphenous Vein harvested endoscopically;  Surgeon: HMelrose Nakayama MD;  [facility]  Service: Open Heart Surgery;  Laterality: N/A;  . LEFT HEART CATH AND CORONARY ANGIOGRAPHY N/A 01/03/2018   Procedure: LEFT HEART CATH AND CORONARY ANGIOGRAPHY;  Surgeon: VJettie Booze MD;  [facility];  Service: Cardiovascular;  Laterality: N/A;  . TEE WITHOUT CARDIOVERSION  N/A 01/08/2018   Procedure: TRANSESOPHAGEAL ECHOCARDIOGRAM (TEE);  Surgeon: HMelrose Nakayama MD;  [facility] [facility]  Service: Open Heart Surgery;  Laterality: N/A;  . WRIST ARTHROPLASTY Right     Current Medications: Prior to Admission medications   Medication Sig Start Date End Date Taking? Authorizing Provider  amiodarone (PACERONE) 200 MG tablet Take 1 tablet (200 mg total) by mouth 2 (two) times daily. 01/16/18  Yes Gold, WWilder Glade PA-C  aspirin EC 325 MG EC tablet Take 1 tablet (325 mg total) by mouth daily. 01/16/18  Yes Gold, Wayne E, PA-C  atorvastatin (LIPITOR) 80 MG tablet Take 1 tablet (80 mg total) by mouth at bedtime. 01/16/18  Yes Gold, Wayne E, PA-C  blood glucose meter kit and supplies KIT Dispense based on patient and insurance preference. Use up to four times daily as directed. (FOR ICD-9 250.00, 250.01). 01/21/18  Yes HMelrose Nakayama MD  carvedilol (COREG) 3.125 MG tablet Take 1 tablet (3.125 mg total) by mouth 2 (two) times daily with a meal. 01/16/18  Yes Gold, Wayne E, PA-C  insulin detemir (LEVEMIR) 100 UNIT/ML injection Inject 0.3 mLs (30 Units total) into the skin 2 (two) times daily. 01/16/18  Yes Gold, Wayne E, PA-C  lisinopril (PRINIVIL,ZESTRIL) 5 MG tablet Take 1 tablet (5 mg total) by mouth daily. 01/16/18  Yes Gold, Wayne E, PA-C  metFORMIN (GLUCOPHAGE) 1000 MG tablet Take 1 tablet (1,000 mg total) by mouth 2 (two) times daily with a  meal. 01/16/18  Yes John Giovanni, PA-C    Allergies:   Patient has no known allergies.   Social History   Socioeconomic History  . Marital status: Single    Spouse name: Not on file  . Number of children: Not on file  . Years of education: Not on file  . Highest education level: Not on file  Occupational History  . Not on file  Social Needs  . Financial resource strain: Not on file  . Food insecurity:    Worry: Not on file    Inability: Not on file  . Transportation needs:    Medical: Not on file    Non-medical: Not  on file  Tobacco Use  . Smoking status: Former Smoker    Types: Cigars, Pipe    Last attempt to quit: 09/04/1987    Years since quitting: 30.4  . Smokeless tobacco: Never Used  Substance and Sexual Activity  . Alcohol use: Not Currently  . Drug use: Never  . Sexual activity: Not on file  Lifestyle  . Physical activity:    Days per week: Not on file    Minutes per session: Not on file  . Stress: Not on file  Relationships  . Social connections:    Talks on phone: Not on file    Gets together: Not on file    Attends religious service: Not on file    Active member of club or organization: Not on file    Attends meetings of clubs or organizations: Not on file    Relationship status: Not on file  Other Topics Concern  . Not on file  Social History Narrative  . Not on file     Family History:  The patient's family history includes Stroke in his sister.   ROS:   Please see the history of present illness.    ROS All other systems reviewed and are negative.   PHYSICAL EXAM:   VS:  BP (!) 100/58   Pulse (!) 104   Ht 6' (1.829 m)   Wt 230 lb (104.3 kg)   SpO2 93%   BMI 31.19 kg/m    GEN: Well nourished, well developed, in no acute distress  HEENT: normal  Neck: no JVD, carotid bruits, or masses Cardiac: RRR; no murmurs, rubs, or gallops,no edema.  Healing surgical scar Respiratory:  clear to auscultation bilaterally, normal work of breathing GI: soft, nontender, nondistended, + BS MS: no deformity or atrophy  Skin: warm and dry, no rash Neuro:  Alert and Oriented x 3, Strength and sensation are intact Psych: euthymic mood, full affect  Wt Readings from Last 3 Encounters:  01/30/18 230 lb (104.3 kg)  01/16/18 221 lb 3.2 oz (100.3 kg)      Studies/Labs Reviewed:   EKG:  EKG is ordered today.  The ekg ordered today demonstrates sinus tachycardia at rate of 104 bpm  Recent Labs: 01/03/2018: TSH 0.867 01/08/2018: ALT 27 01/09/2018: Magnesium 1.7 01/15/2018: BUN 12;  Creatinine, Ser 1.10; Hemoglobin 10.0; Platelets 608; Potassium 3.8; Sodium 130   Lipid Panel    Component Value Date/Time   CHOL 214 (H) 01/04/2018 0433   TRIG 96 01/04/2018 0433   HDL 38 (L) 01/04/2018 0433   CHOLHDL 5.6 01/04/2018 0433   VLDL 19 01/04/2018 0433   LDLCALC 157 (H) 01/04/2018 0433    Additional studies/ records that were reviewed today include:    01/08/18 CORONARY ARTERY BYPASS GRAFTING (CABG) x five, using Left Internal Mammary Artery  and Right Leg Greater Saphenous Vein harvested endoscopically (N/A) TRANSESOPHAGEAL ECHOCARDIOGRAM (TEE) (N/A) INTRA-AORTIC BALLOON PUMP INSERTION (Left) LIMA-LAD SEQ SVG-OM2-OM1 SVG-PD SVG-DIAG   [facility] 01/03/18 Conclusion     Post Atrio lesion is 100% stenosed.  Dist RCA lesion is 25% stenosed.  Mid RCA lesion is 60% stenosed.  Prox RCA lesion is 25% stenosed.  Prox Cx to Mid Cx lesion is 75% stenosed.  Ost 1st Mrg to 1st Mrg lesion is 75% stenosed.  Ost 2nd Mrg to 2nd Mrg lesion is 40% stenosed.  Prox LAD to Mid LAD lesion is 99% stenosed.  2nd Diag lesion is 60% stenosed.  There is moderate left ventricular systolic dysfunction.  The left ventricular ejection fraction is 25-35% by visual estimate.  LV end diastolic pressure is mildly elevated.  There is no aortic valve stenosis.  Severe three vessel disease detected in the setting of NSTEMI. Likely uncontrolled diabetic. Will obtain cardiac surgery consult.    TTE: 01/04/18  Study Conclusions  - Left ventricle: The cavity size was normal. Wall thickness was increased in a pattern of mild LVH. Systolic function was moderately to severely reduced. The estimated ejection fraction was in the range of 30% to 35%. Left ventricular diastolic function parameters were normal. - Regional wall motion abnormality: Akinesis of the mid anterior, basal-mid anteroseptal, apical septal, and apical myocardium; severe hypokinesis of the apical anterior  and mid anterolateral myocardium; hypokinesis of the basal-mid inferolateral myocardium; moderate hypokinesis of the mid inferoseptal and apical inferior myocardium. - Mitral valve: There was mild to moderate regurgitation. - Atrial septum: No defect or patent foramen ovale was identified. - Tricuspid valve: There was mild regurgitation.    ASSESSMENT & PLAN:    1. NSTEMI/ CAD s/p CABG x 5 -Walking without angina or dyspnea.  He never started aspirin.  Reviewed with Dr. Burt Knack.  Restart aspirin 81 mg and Plavix 75 mg daily.  Continue beta-blocker and statin.  2.  Ischemic cardiomyopathy -EF was 30-35 on echocardiogram.  His blood pressure is borderline low with borderline high heart rate.  Will increase Coreg to 6.25 mg twice daily and reduce lisinopril to 2.5 mg daily. -Check echocardiogram to reassess LV function.  If still low EF, he will need LifeVest.  3.  Postop VT requiring shock and CPR -Will reduce amiodarone to 200 mg daily as recommended during discharge.  Likely discontinue during follow-up with Dr. Koleen Nimrod..  Continue beta-blocker.  4.  Hyperlipidemia - 01/04/2018: Cholesterol 214; HDL 38; LDL Cholesterol 157; Triglycerides 96; VLDL 19  -Continue Lipitor 80 mg.  Check labs in 6 weeks.  5.  Diabetes -A1c 12.7 on admission.  Continue metformin and insulin.  Follow-up with PCP.    Medication Adjustments/Labs and Tests Ordered: Current medicines are reviewed at length with the patient today.  Concerns regarding medicines are outlined above.  Medication changes, Labs and Tests ordered today are listed in the Patient Instructions below. Patient Instructions  Medication Instructions:  Your physician has recommended you make the following change in your medication: 1. 1.  REDUCE the Amiodarone to 200 taking 1 tablet daily 2.  START Aspirin 81 mg daily 3.  START Plavix 75 mg taking 1 tablet daily 4.  REDUCE the Lisinopril to 2.5 mg taking 1 tablet daily 5.  INCREASE  the Carvedilol to 6.25 taking 1 tablet twice a day  Labwork: 6 WEEKS:  LIPID & LFT  Testing/Procedures: Your physician has requested that you have an echocardiogram. Echocardiography is a painless test that uses sound  waves to create images of your heart. It provides your doctor with information about the size and shape of your heart and how well your heart's chambers and valves are working. This procedure takes approximately one hour. There are no restrictions for this procedure.     Follow-Up: Your physician recommends that you schedule a follow-up appointment in: 3 MONTHS WITH DR. Burt Knack   Any Other Special Instructions Will Be Listed Below (If Applicable). Echocardiogram An echocardiogram, or echocardiography, uses sound waves (ultrasound) to produce an image of your heart. The echocardiogram is simple, painless, obtained within a short period of time, and offers valuable information to your health care provider. The images from an echocardiogram can provide information such as:  Evidence of coronary artery disease (CAD).  Heart size.  Heart muscle function.  Heart valve function.  Aneurysm detection.  Evidence of a past heart attack.  Fluid buildup around the heart.  Heart muscle thickening.  Assess heart valve function.  Tell a health care provider about:  Any allergies you have.  All medicines you are taking, including vitamins, herbs, eye drops, creams, and over-the-counter medicines.  Any problems you or family members have had with anesthetic medicines.  Any blood disorders you have.  Any surgeries you have had.  Any medical conditions you have.  Whether you are pregnant or may be pregnant. What happens before the procedure? No special preparation is needed. Eat and drink normally. What happens during the procedure?  In order to produce an image of your heart, gel will be applied to your chest and a wand-like tool (transducer) will be moved over your  chest. The gel will help transmit the sound waves from the transducer. The sound waves will harmlessly bounce off your heart to allow the heart images to be captured in real-time motion. These images will then be recorded.  You may need an IV to receive a medicine that improves the quality of the pictures. What happens after the procedure? You may return to your normal schedule including diet, activities, and medicines, unless your health care provider tells you otherwise. This information is not intended to replace advice given to you by your health care provider. Make sure you discuss any questions you have with your health care provider. Document Released: 08/17/2000 Document Revised: 04/07/2016 Document Reviewed: 04/27/2013 Elsevier Interactive Patient Education  2017 [employer].     If you need a refill on your cardiac medications before your next appointment, please call your pharmacy.      Jarrett Soho, [place]  01/30/2018 11:31 AM    [facility] [address], [place], [place]  [address] Phone: [phone]; Fax: [phone]

## 2018-01-30 NOTE — Patient Instructions (Signed)
Medication Instructions:  Your physician has recommended you make the following change in your medication: 1. 1.  REDUCE the Amiodarone to 200 taking 1 tablet daily 2.  START Aspirin 81 mg daily 3.  START Plavix 75 mg taking 1 tablet daily 4.  REDUCE the Lisinopril to 2.5 mg taking 1 tablet daily 5.  INCREASE the Carvedilol to 6.25 taking 1 tablet twice a day  Labwork: 6 WEEKS:  LIPID & LFT  Testing/Procedures: Your physician has requested that you have an echocardiogram. Echocardiography is a painless test that uses sound waves to create images of your heart. It provides your doctor with information about the size and shape of your heart and how well your heart's chambers and valves are working. This procedure takes approximately one hour. There are no restrictions for this procedure.     Follow-Up: Your physician recommends that you schedule a follow-up appointment in: 3 MONTHS WITH DR. Excell Seltzer   Any Other Special Instructions Will Be Listed Below (If Applicable). Echocardiogram An echocardiogram, or echocardiography, uses sound waves (ultrasound) to produce an image of your heart. The echocardiogram is simple, painless, obtained within a short period of time, and offers valuable information to your health care provider. The images from an echocardiogram can provide information such as:  Evidence of coronary artery disease (CAD).  Heart size.  Heart muscle function.  Heart valve function.  Aneurysm detection.  Evidence of a past heart attack.  Fluid buildup around the heart.  Heart muscle thickening.  Assess heart valve function.  Tell a health care provider about:  Any allergies you have.  All medicines you are taking, including vitamins, herbs, eye drops, creams, and over-the-counter medicines.  Any problems you or family members have had with anesthetic medicines.  Any blood disorders you have.  Any surgeries you have had.  Any medical conditions you  have.  Whether you are pregnant or may be pregnant. What happens before the procedure? No special preparation is needed. Eat and drink normally. What happens during the procedure?  In order to produce an image of your heart, gel will be applied to your chest and a wand-like tool (transducer) will be moved over your chest. The gel will help transmit the sound waves from the transducer. The sound waves will harmlessly bounce off your heart to allow the heart images to be captured in real-time motion. These images will then be recorded.  You may need an IV to receive a medicine that improves the quality of the pictures. What happens after the procedure? You may return to your normal schedule including diet, activities, and medicines, unless your health care provider tells you otherwise. This information is not intended to replace advice given to you by your health care provider. Make sure you discuss any questions you have with your health care provider. Document Released: 08/17/2000 Document Revised: 04/07/2016 Document Reviewed: 04/27/2013 Elsevier Interactive Patient Education  2017 [employer].     If you need a refill on your cardiac medications before your next appointment, please call your pharmacy.

## 2018-02-07 ENCOUNTER — Other Ambulatory Visit: Payer: Self-pay | Admitting: Thoracic Surgery (Cardiothoracic Vascular Surgery)

## 2018-02-07 DIAGNOSIS — Z951 Presence of aortocoronary bypass graft: Secondary | ICD-10-CM

## 2018-02-11 ENCOUNTER — Encounter: Payer: Self-pay | Admitting: Internal Medicine

## 2018-02-11 ENCOUNTER — Ambulatory Visit
Admission: RE | Admit: 2018-02-11 | Discharge: 2018-02-11 | Disposition: A | Discharge status: Home / Self Care | Payer: Self-pay | Source: Ambulatory Visit | Attending: Thoracic Surgery (Cardiothoracic Vascular Surgery) | Admitting: Thoracic Surgery (Cardiothoracic Vascular Surgery)

## 2018-02-11 ENCOUNTER — Ambulatory Visit (INDEPENDENT_AMBULATORY_CARE_PROVIDER_SITE_OTHER): Payer: Self-pay | Admitting: Thoracic Surgery (Cardiothoracic Vascular Surgery)

## 2018-02-11 ENCOUNTER — Ambulatory Visit: Payer: Self-pay | Admitting: Internal Medicine

## 2018-02-11 VITALS — BP 110/70 | HR 90 | Ht 72.0 in | Wt 236.0 lb

## 2018-02-11 DIAGNOSIS — E119 Type 2 diabetes mellitus without complications: Secondary | ICD-10-CM

## 2018-02-11 DIAGNOSIS — E782 Mixed hyperlipidemia: Secondary | ICD-10-CM

## 2018-02-11 DIAGNOSIS — E785 Hyperlipidemia, unspecified: Secondary | ICD-10-CM | POA: Insufficient documentation

## 2018-02-11 DIAGNOSIS — I214 Non-ST elevation (NSTEMI) myocardial infarction: Secondary | ICD-10-CM

## 2018-02-11 DIAGNOSIS — Z951 Presence of aortocoronary bypass graft: Secondary | ICD-10-CM

## 2018-02-11 MED ORDER — GLIPIZIDE 10 MG PO TABS: 10.0000 mg | ORAL_TABLET | Freq: Two times a day (BID) | ORAL | 11 refills | Status: DC

## 2018-02-11 MED ORDER — GLUCOSE BLOOD VI STRP: ORAL_STRIP | 11 refills | Status: DC

## 2018-02-11 MED ORDER — LANCETS MISC: 11 refills | Status: AC
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
## 2018-02-11 NOTE — Patient Instructions (Signed)
Stop amiodarone
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
## 2018-02-11 NOTE — Progress Notes (Signed)
Subjective:    Patient ID: Allen Cox, male    DOB: 03/09/1971, 47 y.o.   MRN: [id number]  HPI   Here to establish  May 3. 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 He noted he was very dyspneic following the interaction, which at times became physical. He also did note some "weightiness"  On his left anterior chest. Was taken to Urgent Care and noted to have ECG changes.  Was sent by ambulance to hospital. Ultimately found to have NSTEMI   Underwent  5 bypass grafts with left internal mammary artery and right leg greater saphenous vein grafts. Patient states he coded twice during the surgery, once requiring open heart massage, another with shock and CPR.  He is taking Amiodarone for the Vtach he suffered postoperatively. EF noted at 30-35%  Day after MI.  Similar with cath day later. His only known risk habits prior to MI were a poor diet and intermittent cigar smoking, male sex.  No family history of heart disease, though father with history of DM. He was found to have DM during same hospitalization for recent MI and CABG.    1.  DM:  New onset. A1C 12.4% 01/2018  Using Levemir 30 units twice daily and Metformin 1000 mg twice daily. Checking sugars only really at bedtime. Not very scheduled with meals.  Current Meds  Medication Sig  . aspirin EC 81 MG tablet Take 1 tablet (81 mg total) by mouth daily.  Marland Kitchen atorvastatin (LIPITOR) 80 MG tablet Take 1 tablet (80 mg total) by mouth at bedtime.  . blood glucose meter kit and supplies KIT Dispense based on patient and insurance preference. Use up to four times daily as directed. (FOR ICD-9 250.00, 250.01).  . carvedilol (COREG) 6.25 MG tablet Take 1 tablet (6.25 mg total) by mouth 2 (two) times daily.  . clopidogrel (PLAVIX) 75 MG tablet Take 1 tablet (75 mg total) by mouth daily.  . metFORMIN (GLUCOPHAGE) 1000 MG tablet Take 1 tablet (1,000 mg  total) by mouth 2 (two) times daily with a meal. (Patient not taking: Reported on 05/23/2018)  . [DISCONTINUED] amiodarone (PACERONE) 200 MG tablet Take 1 tablet (200 mg total) by mouth daily.  . [DISCONTINUED] insulin detemir (LEVEMIR) 100 UNIT/ML injection Inject 0.3 mLs (30 Units total) into the skin 2 (two) times daily. (Patient not taking: Reported on 03/19/2018)  . [DISCONTINUED] lisinopril (PRINIVIL,ZESTRIL) 2.5 MG tablet Take 1 tablet (2.5 mg total) by mouth daily.  . [DISCONTINUED] oxyCODONE (OXY IR/ROXICODONE) 5 MG immediate release tablet Take 5 mg by mouth every 4 (four) hours as needed for severe pain.   No Known Allergies   Past Medical History:  Diagnosis Date  . CAD (coronary artery disease) of bypass graft   . DM (diabetes mellitus) ([facility])   . HLD (hyperlipidemia)   . Ischemic cardiomyopathy   . Tobacco use   . VT (ventricular tachycardia) ([facility])     Past Surgical History:  Procedure Laterality Date  . CORONARY ARTERY BYPASS GRAFT N/A 01/08/2018   Procedure: CORONARY ARTERY BYPASS GRAFTING (CABG) x five, using Left Internal Mammary Artery and Right Leg Greater Saphenous Vein harvested endoscopically;  Surgeon: Melrose Nakayama, MD;  Location: [facility];  Service: Open Heart Surgery;  Laterality: N/A;  . LEFT HEART CATH AND CORONARY ANGIOGRAPHY N/A 01/03/2018   Procedure: LEFT HEART CATH AND CORONARY ANGIOGRAPHY;  Surgeon: Jettie Booze, MD;  Location: [facility];  Service: Cardiovascular;  Laterality: N/A;  . TEE WITHOUT CARDIOVERSION N/A 01/08/2018   Procedure: TRANSESOPHAGEAL ECHOCARDIOGRAM (TEE);  Surgeon: Melrose Nakayama, MD;  Location: [facility];  Service: Open Heart Surgery;  Laterality: N/A;  . WRIST ARTHROPLASTY Right    Family History  Problem Relation Age of Onset  . Stroke Sister   . CAD Neg Hx    Social History   Socioeconomic History  . Marital status: Single    Spouse name: Not on file  . Number of children: Not on file  . Years of  education: Not on file  . Highest education level: Not on file  Occupational History  . Not on file  Social Needs  . Financial resource strain: Not on file  . Food insecurity:    Worry: Never true    Inability: Never true  . Transportation needs:    Medical: No    Non-medical: No  Tobacco Use  . Smoking status: Former Smoker    Types: Cigars, Pipe    Last attempt to quit: 09/04/1987    Years since quitting: 30.7  . Smokeless tobacco: Never Used  Substance and Sexual Activity  . Alcohol use: Not Currently  . Drug use: Never  . Sexual activity: Not on file  Lifestyle  . Physical activity:    Days per week: Not on file    Minutes per session: Not on file  . Stress: Not at all  Relationships  . Social connections:    Talks on phone: Not on file    Gets together: Not on file    Attends religious service: Not on file    Active member of club or organization: Not on file    Attends meetings of clubs or organizations: Not on file    Relationship status: Not on file  . Intimate partner violence:    Fear of current or ex partner: Not on file    Emotionally abused: No    Physically abused: No    Forced sexual activity: Not on file  Other Topics Concern  . Not on file  Social History Narrative  . Not on file     Review of Systems     Objective:   Physical Exam NAD HEENT: PERRL, EOMI, discs sharp, TMs pearly gray, dental decay Neck:  Supple, No adenopathy, no thyromegaly Chest:  CTA.  Well healed sternal chest scar CV:  RRR with normal S1 and S2, No S3 or S4.  No carotid bruits. Carotid, radial and DP pulses normal and equal Abd:  S, NT, No HSM or mass, + BS LE:  Trace edema.       Assessment & Plan:  1.  DM:  He would like to move to oral medications only if possible.  Continue Metformin.  Add Glipizide 10 mg twice daily and stop Levemir the day after starting Glipizide. Glucose monitor supplies ordered--to keep track of sugars twice daily before meals. Fasting  labs in 2 weeks if not done with cardiology:  Urine microalbumin/cre, FLP, BMP Long discussion regarding long term complications or poorly controlled DM and lifestyle changes needed.  2.  Ischemic cardiomyopathy:  13th of JUne--repeat echo following revascularization and medication management  3.  Hyperlipidemia:  Labs as above if not done on the 11th with Cardiology.  Lifestyle changes.

## 2018-02-11 NOTE — Patient Instructions (Signed)
Drink a glass of water before every meal Drink 6-8 glasses of water daily Eat three meals daily Eat a protein and healthy fat with every meal (eggs,fish, chicken, [place]key and limit red meats) Eat 5 servings of vegetables daily, mix the colors Eat 2 servings of fruit daily with skin, if skin is edible Use smaller plates Put food/utensils down as you chew and swallow each bite Eat at a table with friends/family at least once daily, no TV Do not eat in front of the TV  Recent studies show that people who consume all of their calories in a 12 hour period lose weight more efficiently.  For example, if you eat your first meal at 7:00 a.m., your last meal of the day should be completed by 7:00 p.m.  Stop Levemir tomorrow and start Glipizide with the Metformin Check sugars twice daily before meals and make sure you are eating about the same time every day. Call clinic if your sugars are getting higher

## 2018-02-13 ENCOUNTER — Other Ambulatory Visit: Payer: Self-pay

## 2018-02-13 ENCOUNTER — Ambulatory Visit (HOSPITAL_COMMUNITY): Payer: Self-pay | Attending: Cardiology

## 2018-02-13 DIAGNOSIS — I252 Old myocardial infarction: Secondary | ICD-10-CM | POA: Insufficient documentation

## 2018-02-13 DIAGNOSIS — I472 Ventricular tachycardia: Secondary | ICD-10-CM | POA: Insufficient documentation

## 2018-02-13 DIAGNOSIS — I255 Ischemic cardiomyopathy: Secondary | ICD-10-CM

## 2018-02-13 DIAGNOSIS — E119 Type 2 diabetes mellitus without complications: Secondary | ICD-10-CM | POA: Insufficient documentation

## 2018-02-13 DIAGNOSIS — Z720 Tobacco use: Secondary | ICD-10-CM | POA: Insufficient documentation

## 2018-02-13 DIAGNOSIS — E785 Hyperlipidemia, unspecified: Secondary | ICD-10-CM | POA: Insufficient documentation

## 2018-02-13 DIAGNOSIS — I517 Cardiomegaly: Secondary | ICD-10-CM | POA: Insufficient documentation

## 2018-02-13 DIAGNOSIS — I2510 Atherosclerotic heart disease of native coronary artery without angina pectoris: Secondary | ICD-10-CM | POA: Insufficient documentation

## 2018-02-13 DIAGNOSIS — Z951 Presence of aortocoronary bypass graft: Secondary | ICD-10-CM | POA: Insufficient documentation

## 2018-02-14 ENCOUNTER — Telehealth (HOSPITAL_COMMUNITY): Payer: Self-pay

## 2018-02-14 NOTE — Telephone Encounter (Signed)
Called patient in regards to Cardiac Rehab and insurance - Patient stated he does not have insurance but expressed interested in the Maintenance program. Passed referral to Maintenance Coordinator. Closed referral.

## 2018-02-19 ENCOUNTER — Other Ambulatory Visit (INDEPENDENT_AMBULATORY_CARE_PROVIDER_SITE_OTHER): Payer: Self-pay

## 2018-02-19 ENCOUNTER — Telehealth: Payer: Self-pay

## 2018-02-19 DIAGNOSIS — E119 Type 2 diabetes mellitus without complications: Secondary | ICD-10-CM

## 2018-02-19 DIAGNOSIS — I421 Obstructive hypertrophic cardiomyopathy: Secondary | ICD-10-CM

## 2018-02-19 NOTE — Telephone Encounter (Signed)
-----   Message from Tonny BollmanMichael Cooper, MD sent at 02/16/2018 12:09 PM EDT ----- I agree with recommendation of Vin for LifeVest. Please arrange. I'm happy to discuss with patient if he has questions. He should have a 3 month cardiac MRI to accurately assess his LVEF and help guide decision regarding ICD. thanks

## 2018-02-19 NOTE — Telephone Encounter (Signed)
Informed patient of results and verbal understanding expressed.  Patient agrees to LifeVest. He understands Tresa EndoKelly, RN will contact him to initiate this process. Cardiac MRI ordered to be scheduled in 3 months. Patient agrees with treatment plan.

## 2018-02-19 NOTE — Telephone Encounter (Signed)
thx Orpha BurKaty!

## 2018-02-20 ENCOUNTER — Telehealth: Payer: Self-pay

## 2018-02-20 ENCOUNTER — Telehealth: Payer: Self-pay | Admitting: Cardiovascular Disease

## 2018-02-20 LAB — MICROALBUMIN / CREATININE URINE RATIO
Creatinine, Urine: 161.5 mg/dL
MICROALB/CREAT RATIO: 4.3 mg/g{creat} (ref 0.0–30.0)
MICROALBUM., U, RANDOM: 7 ug/mL

## 2018-02-20 NOTE — Telephone Encounter (Signed)
Called patient and LVM to call me back with time of day he would like his cardiac MRI scheduled in 3 months.

## 2018-02-20 NOTE — Telephone Encounter (Signed)
Mr. Juanetta GoslingHawkins called because he could not remember the medication that Dr. Dorris FetchHendrickson took him off of at his last post-op appointment.  After referring to Dr. Sunday CornHendrickson's note, he was taken off of Amiodarone.  Patient aware of change and thanked me for the call.  I advised him to give us a call if he had any other questions, he acknowledged receipt.

## 2018-02-25 ENCOUNTER — Telehealth (HOSPITAL_COMMUNITY): Payer: Self-pay | Admitting: *Deleted

## 2018-03-03 NOTE — Telephone Encounter (Signed)
Cardiac MRI has been scheduled 9/12.

## 2018-03-11 ENCOUNTER — Ambulatory Visit (INDEPENDENT_AMBULATORY_CARE_PROVIDER_SITE_OTHER): Payer: Self-pay | Admitting: Thoracic Surgery (Cardiothoracic Vascular Surgery)

## 2018-03-11 ENCOUNTER — Other Ambulatory Visit: Payer: Self-pay

## 2018-03-11 ENCOUNTER — Encounter: Payer: Self-pay | Admitting: Thoracic Surgery (Cardiothoracic Vascular Surgery)

## 2018-03-11 VITALS — BP 121/78 | HR 90 | Resp 18 | Ht 72.0 in | Wt 231.6 lb

## 2018-03-11 DIAGNOSIS — Z951 Presence of aortocoronary bypass graft: Secondary | ICD-10-CM

## 2018-03-11 NOTE — Progress Notes (Signed)
[address]       [place] [address]             [phone]    HPI: Mr. Gros returns for a scheduled follow-up visit  Draco Malczewski is a 47 year old man who presented with a non-ST elevation MI in May.  He had three-vessel disease with impaired left ventricular function with ejection fraction of 25 to 35%.  He underwent coronary bypass grafting x 5 on 01/08/2018.  After coming off bypass he had to V. fib arrest.  We had to place an intra-aortic balloon pump and reopen.  No problem was found with any of the grafts.  After that his recovery was uneventful.  He was diagnosed with type 2 diabetes on admission.  I saw him in the office on 02/11/2018.  He was complaining of some numbness and pain in his right leg anteriorly and feeling short of breath.  Says he is feeling better.  He is nowhere near 100% recovered.  He still has pain in his sternal area.  He still has some numbness in the right leg anteriorly.  Both have improved but not resolved.  He is using oxycodone about 1 time a week.  Past Medical History:  Diagnosis Date  . CAD (coronary artery disease) of bypass graft   . DM (diabetes mellitus) ([facility])   . HLD (hyperlipidemia)   . Ischemic cardiomyopathy   . Tobacco use   . VT (ventricular tachycardia) ([facility])     Current Outpatient Medications  Medication Sig Dispense Refill  . aspirin EC 81 MG tablet Take 1 tablet (81 mg total) by mouth daily. 90 tablet 3  . atorvastatin (LIPITOR) 80 MG tablet Take 1 tablet (80 mg total) by mouth at bedtime. 30 tablet 1  . blood glucose meter kit and supplies KIT Dispense based on patient and insurance preference. Use up to four times daily as directed. (FOR ICD-9 250.00, 250.01). 1 each 1  . carvedilol (COREG) 6.25 MG tablet Take 1 tablet (6.25 mg total) by mouth 2 (two) times daily. 180 tablet 3  . clopidogrel (PLAVIX) 75 MG tablet Take 1 tablet (75 mg total) by mouth daily. 90 tablet 3  . glipiZIDE (GLUCOTROL) 10 MG  tablet Take 1 tablet (10 mg total) by mouth 2 (two) times daily before a meal. 60 tablet 11  . glucose blood (TRUE METRIX BLOOD GLUCOSE TEST) test strip Check blood glucose twice daily before meals 100 each 11  . insulin detemir (LEVEMIR) 100 UNIT/ML injection Inject 0.3 mLs (30 Units total) into the skin 2 (two) times daily. 10 mL 11  . Lancets MISC Check blood glucose twice daily before meals 100 each 11  . lisinopril (PRINIVIL,ZESTRIL) 2.5 MG tablet Take 1 tablet (2.5 mg total) by mouth daily. 90 tablet 3  . metFORMIN (GLUCOPHAGE) 1000 MG tablet Take 1 tablet (1,000 mg total) by mouth 2 (two) times daily with a meal. 60 tablet 1  . oxyCODONE (OXY IR/ROXICODONE) 5 MG immediate release tablet Take 5 mg by mouth every 4 (four) hours as needed for severe pain.     No current facility-administered medications for this visit.     Physical Exam BP 121/78 (BP Location: Right Arm, Patient Position: Sitting, Cuff Size: Normal)   Pulse 90   Resp 18   Ht 6' (1.829 m)   Wt 231 lb 9.6 oz (105.1 kg)   SpO2 98% Comment: RA  BMI 31.27 kg/m  47 year old man in no  acute distress Alert and oriented x3 with no focal deficits Sternal incision clean, dry and intact, sternum stable, mild tenderness to palpation Cardiac regular rate and rhythm normal S1-S2 Lungs clear with equal breath sounds bilaterally Right groin 2+ femoral pulse, no bruit  Diagnostic Tests: Echocardiogram 02/13/2018 Study Conclusions  - Left ventricle: The cavity size was normal. Wall thickness was   increased in a pattern of moderate LVH. Systolic function was   moderately to severely reduced. The estimated ejection fraction   was in the range of 30% to 35%. Severe hypokinesis of the   mid-apicalapical myocardium. Features are consistent with a   pseudonormal left ventricular filling pattern, with concomitant   abnormal relaxation and increased filling pressure (grade 2   diastolic dysfunction).  Impression: Haithcock is a  47 year old man who is now about 8 weeks out from coronary artery bypass grafting x5 following a non-ST elevation MI.  He did have transient ventricular tachycardia intraoperatively, which resolved after placement of an intra-aortic balloon pump.  He did not have any atrial or ventricular arrhythmias postoperatively.  He still has some incisional discomfort.  That is not unusual.  It is improving.  His right leg numbness may be related to placement of the intra-aortic balloon pump.  I do not find any evidence of a pseudoaneurysm or AV fistula in the right groin.  Those issues both should resolve with time.  There are no restrictions on his activities at this point, but is not yet ready to return to work.  He has a follow-up appointment with Dr. Burt Knack on Thursday.  His blood sugars have been much better controlled with a high of 128 over the past 2 weeks.  Plan: Follow-up with Dr. Burt Knack as scheduled.  I will be happy to see Mr. Weisner back anytime if I can be of any further assistance with his care.  Melrose Nakayama, MD [facility] [phone]

## 2018-03-13 ENCOUNTER — Other Ambulatory Visit: Payer: Self-pay

## 2018-03-13 DIAGNOSIS — I255 Ischemic cardiomyopathy: Secondary | ICD-10-CM

## 2018-03-13 LAB — LIPID PANEL
Chol/HDL Ratio: 3.5 ratio (ref 0.0–5.0)
Cholesterol, Total: 188 mg/dL (ref 100–199)
HDL: 53 mg/dL (ref >39)
LDL CALC: 125 mg/dL — AB (ref 0–99)
Triglycerides: 49 mg/dL (ref 0–149)
VLDL CHOLESTEROL CAL: 10 mg/dL (ref 5–40)

## 2018-03-13 LAB — HEPATIC FUNCTION PANEL
ALBUMIN: 4.2 g/dL (ref 3.5–5.5)
ALK PHOS: 109 IU/L (ref 39–117)
ALT: 19 IU/L (ref 0–44)
AST: 15 IU/L (ref 0–40)
BILIRUBIN, DIRECT: 0.11 mg/dL (ref 0.00–0.40)
Bilirubin Total: 0.3 mg/dL (ref 0.0–1.2)
TOTAL PROTEIN: 7.1 g/dL (ref 6.0–8.5)

## 2018-03-14 ENCOUNTER — Ambulatory Visit: Payer: Self-pay | Admitting: Internal Medicine

## 2018-03-18 ENCOUNTER — Telehealth (HOSPITAL_COMMUNITY): Payer: Self-pay | Admitting: *Deleted

## 2018-03-19 ENCOUNTER — Ambulatory Visit: Payer: Medicaid Other | Admitting: Internal Medicine

## 2018-03-19 ENCOUNTER — Encounter: Payer: Self-pay | Admitting: Internal Medicine

## 2018-03-19 VITALS — BP 110/78 | HR 60 | Resp 12 | Ht 72.0 in | Wt 239.0 lb

## 2018-03-19 DIAGNOSIS — E782 Mixed hyperlipidemia: Secondary | ICD-10-CM

## 2018-03-19 DIAGNOSIS — E119 Type 2 diabetes mellitus without complications: Secondary | ICD-10-CM

## 2018-03-19 LAB — GLUCOSE, POCT (MANUAL RESULT ENTRY): POC GLUCOSE: 216 mg/dL — AB (ref 70–99)

## 2018-03-19 NOTE — Progress Notes (Signed)
   Subjective:    Patient ID: Allen Cox, male    DOB: 01-19-1971, 47 y.o.   MRN: [id number]  HPI   1.  DM:  Glucose in morning running 140-180.  If checks before eating in the morning, he never gets anything above 112.  Before lunch, sugars running 118 or below, after lunch 130. Before dinner rarely checks, but has had 120-140, after dinner, sugars run 160-180.   He is not sure how long after eating he is checking--could be 2-3 hours.   He is tolerating oral medication fine.   He is happy not to have to purchase insulin.  2.  Hyperlipidemia:  LDL still high at 125.  He is at goal with other levels.  He states he has not missed his Atorvastatin 80 mg daily.  Cardiology office tried to contact him. But he is in seeing me.   3.  S/P CABG x 5:  No chest pain, though weird sensations in anterior chest and shoulder at times.  No PND, Orthopnea, occasional swelling of feet, but resolves with elevation   Current Meds  Medication Sig  . aspirin EC 81 MG tablet Take 1 tablet (81 mg total) by mouth daily.  Marland Kitchen atorvastatin (LIPITOR) 80 MG tablet Take 1 tablet (80 mg total) by mouth at bedtime.  . blood glucose meter kit and supplies KIT Dispense based on patient and insurance preference. Use up to four times daily as directed. (FOR ICD-9 250.00, 250.01).  . carvedilol (COREG) 6.25 MG tablet Take 1 tablet (6.25 mg total) by mouth 2 (two) times daily.  . clopidogrel (PLAVIX) 75 MG tablet Take 1 tablet (75 mg total) by mouth daily.  Marland Kitchen glipiZIDE (GLUCOTROL) 10 MG tablet Take 1 tablet (10 mg total) by mouth 2 (two) times daily before a meal.  . glucose blood (TRUE METRIX BLOOD GLUCOSE TEST) test strip Check blood glucose twice daily before meals  . Lancets MISC Check blood glucose twice daily before meals  . lisinopril (PRINIVIL,ZESTRIL) 2.5 MG tablet Take 1 tablet (2.5 mg total) by mouth daily.  . metFORMIN (GLUCOPHAGE) 1000 MG tablet Take 1 tablet (1,000 mg total) by mouth 2 (two) times daily with  a meal.  . oxyCODONE (OXY IR/ROXICODONE) 5 MG immediate release tablet Take 5 mg by mouth every 4 (four) hours as needed for severe pain.    No Known Allergies    Review of Systems     Objective:   Physical Exam Appears well Lungs:  CTA CV:  RRR with normal S1 and S2, No S3, S4 or murmur. Radial and DP pulses normal and equal LE:  No edema      Assessment & Plan:  1.  DM:  Sugars appear to be controlled on oral meds.  Would like him to be more uniform with checking sugars and to just check twice daily before meals. He needs to get orange card so can obtain glucometer supplies less expensively at [place]. Drop off sugar journal in 1 month Follow up in 2 months for A1C and F/U Influenza vaccine in September   2. Hyperlipidemia:  Not at goal.  He is to call Cardiology to notify them he is taking his statin regularly and obtain their advice on next steps.  3.  Cardiac status stable

## 2018-03-24 ENCOUNTER — Telehealth (HOSPITAL_COMMUNITY): Payer: Self-pay | Admitting: *Deleted

## 2018-03-27 ENCOUNTER — Telehealth: Payer: Self-pay | Admitting: Physician Assistant

## 2018-03-27 DIAGNOSIS — E782 Mixed hyperlipidemia: Secondary | ICD-10-CM

## 2018-03-27 DIAGNOSIS — Z79899 Other long term (current) drug therapy: Secondary | ICD-10-CM

## 2018-03-27 MED ORDER — EZETIMIBE 10 MG PO TABS: 10.0000 mg | ORAL_TABLET | Freq: Every day | ORAL | 3 refills | Status: DC

## 2018-03-27 NOTE — Telephone Encounter (Signed)
Left message for patient to call back. Per Dr. Excell Seltzerooper, please ask patient to add Zetia 10 mg daily and repeat labs in 8 - 12 weeks. Will talk to patient about these recommendations when he calls back.

## 2018-03-27 NOTE — Telephone Encounter (Signed)
Patient called back. Informed patient of Dr. Earmon Phoenixooper's recommendations. Per Dr. Copper, add Zetia 10 mg daily and repeat labs in 8 to 12 weeks. Patient will come in on 05/29/18 for repeat lab work. Patient requested medication information. Will mail patient a copy of medication information.

## 2018-03-27 NOTE — Telephone Encounter (Signed)
Follow Up:     Pt is returning Katy's call from Tuesday, concerning his lab results.

## 2018-04-01 ENCOUNTER — Telehealth (HOSPITAL_COMMUNITY): Payer: Self-pay | Admitting: *Deleted

## 2018-04-07 ENCOUNTER — Encounter (HOSPITAL_COMMUNITY)
Admission: RE | Admit: 2018-04-07 | Discharge: 2018-04-07 | Disposition: A | Discharge status: Home / Self Care | Payer: Self-pay | Source: Ambulatory Visit | Attending: Cardiovascular Disease | Admitting: Cardiovascular Disease

## 2018-04-07 DIAGNOSIS — Z951 Presence of aortocoronary bypass graft: Secondary | ICD-10-CM | POA: Insufficient documentation

## 2018-04-07 DIAGNOSIS — I252 Old myocardial infarction: Secondary | ICD-10-CM | POA: Insufficient documentation

## 2018-04-09 ENCOUNTER — Encounter (HOSPITAL_COMMUNITY)
Admission: RE | Admit: 2018-04-09 | Discharge: 2018-04-09 | Disposition: A | Discharge status: Home / Self Care | Payer: Self-pay | Source: Ambulatory Visit | Attending: Cardiovascular Disease | Admitting: Cardiovascular Disease

## 2018-04-11 ENCOUNTER — Encounter (HOSPITAL_COMMUNITY)
Admission: RE | Admit: 2018-04-11 | Discharge: 2018-04-11 | Disposition: A | Discharge status: Home / Self Care | Payer: Self-pay | Source: Ambulatory Visit | Attending: Cardiovascular Disease | Admitting: Cardiovascular Disease

## 2018-04-14 ENCOUNTER — Encounter (HOSPITAL_COMMUNITY)
Admission: RE | Admit: 2018-04-14 | Discharge: 2018-04-14 | Disposition: A | Discharge status: Home / Self Care | Payer: Self-pay | Source: Ambulatory Visit | Attending: Cardiovascular Disease | Admitting: Cardiovascular Disease

## 2018-04-14 NOTE — Progress Notes (Signed)
Daily Session Note  Patient Details  Name: Allen Cox MRN: [id number] Date of Birth: 10/17/1970 Referring Provider:    Encounter Date: 04/14/2018  Check In:   Capillary Blood Glucose: No results found for this or any previous visit (from the past 24 hour(s)).    Social History   Tobacco Use  Smoking Status Former Smoker  . Types: Cigars, Pipe  . Last attempt to quit: 09/04/1987  . Years since quitting: 30.6  Smokeless Tobacco Never Used    Goals Met:  Exercise tolerated well  Goals Unmet:  Not Applicable  Comments: pt arrived at cardiac rehab maintenance with home antidiabetic medication list.  Electronic Med list reconciled.  Pt demonstrates good CBG trend with exercise.  Joann Rion, RN, BSN Cardiac Pulmonary Rehab    Dr. Traci Turner is Medical Director for Cardiac Rehab at [facility]. 

## 2018-04-16 ENCOUNTER — Encounter (HOSPITAL_COMMUNITY)
Admission: RE | Admit: 2018-04-16 | Discharge: 2018-04-16 | Disposition: A | Discharge status: Home / Self Care | Payer: Self-pay | Source: Ambulatory Visit | Attending: Cardiovascular Disease | Admitting: Cardiovascular Disease

## 2018-04-18 ENCOUNTER — Encounter (HOSPITAL_COMMUNITY)
Admission: RE | Admit: 2018-04-18 | Discharge: 2018-04-18 | Disposition: A | Discharge status: Home / Self Care | Payer: Self-pay | Source: Ambulatory Visit | Attending: Cardiovascular Disease | Admitting: Cardiovascular Disease

## 2018-04-21 ENCOUNTER — Encounter (HOSPITAL_COMMUNITY)
Admission: RE | Admit: 2018-04-21 | Discharge: 2018-04-21 | Disposition: A | Discharge status: Home / Self Care | Payer: Self-pay | Source: Ambulatory Visit | Attending: Cardiovascular Disease | Admitting: Cardiovascular Disease

## 2018-04-23 ENCOUNTER — Encounter (HOSPITAL_COMMUNITY)
Admission: RE | Admit: 2018-04-23 | Discharge: 2018-04-23 | Disposition: A | Discharge status: Home / Self Care | Payer: Self-pay | Source: Ambulatory Visit | Attending: Cardiovascular Disease | Admitting: Cardiovascular Disease

## 2018-04-25 ENCOUNTER — Encounter (HOSPITAL_COMMUNITY)
Admission: RE | Admit: 2018-04-25 | Discharge: 2018-04-25 | Disposition: A | Discharge status: Home / Self Care | Payer: Self-pay | Source: Ambulatory Visit | Attending: Cardiovascular Disease | Admitting: Cardiovascular Disease

## 2018-04-28 ENCOUNTER — Encounter (HOSPITAL_COMMUNITY)
Admission: RE | Admit: 2018-04-28 | Discharge: 2018-04-28 | Disposition: A | Discharge status: Home / Self Care | Payer: Self-pay | Source: Ambulatory Visit | Attending: Cardiovascular Disease | Admitting: Cardiovascular Disease

## 2018-04-30 ENCOUNTER — Encounter (HOSPITAL_COMMUNITY)
Admission: RE | Admit: 2018-04-30 | Discharge: 2018-04-30 | Disposition: A | Discharge status: Home / Self Care | Payer: Self-pay | Source: Ambulatory Visit | Attending: Cardiovascular Disease | Admitting: Cardiovascular Disease

## 2018-04-30 ENCOUNTER — Encounter: Payer: Self-pay | Admitting: Cardiovascular Disease

## 2018-05-02 ENCOUNTER — Encounter (HOSPITAL_COMMUNITY)
Admission: RE | Admit: 2018-05-02 | Discharge: 2018-05-02 | Disposition: A | Discharge status: Home / Self Care | Payer: Self-pay | Source: Ambulatory Visit | Attending: Cardiovascular Disease | Admitting: Cardiovascular Disease

## 2018-05-07 ENCOUNTER — Encounter (HOSPITAL_COMMUNITY)
Admission: RE | Admit: 2018-05-07 | Discharge: 2018-05-07 | Disposition: A | Discharge status: Home / Self Care | Payer: Medicaid Other | Source: Ambulatory Visit | Attending: Cardiovascular Disease | Admitting: Cardiovascular Disease

## 2018-05-07 DIAGNOSIS — I252 Old myocardial infarction: Secondary | ICD-10-CM | POA: Insufficient documentation

## 2018-05-07 DIAGNOSIS — Z951 Presence of aortocoronary bypass graft: Secondary | ICD-10-CM | POA: Insufficient documentation

## 2018-05-09 ENCOUNTER — Encounter (HOSPITAL_COMMUNITY)
Admission: RE | Admit: 2018-05-09 | Discharge: 2018-05-09 | Disposition: A | Discharge status: Home / Self Care | Payer: Self-pay | Source: Ambulatory Visit | Attending: Cardiovascular Disease | Admitting: Cardiovascular Disease

## 2018-05-12 ENCOUNTER — Encounter (HOSPITAL_COMMUNITY)
Admission: RE | Admit: 2018-05-12 | Discharge: 2018-05-12 | Disposition: A | Discharge status: Home / Self Care | Payer: Medicaid Other | Source: Ambulatory Visit | Attending: Cardiovascular Disease | Admitting: Cardiovascular Disease

## 2018-05-14 ENCOUNTER — Encounter (HOSPITAL_COMMUNITY)
Admission: RE | Admit: 2018-05-14 | Discharge: 2018-05-14 | Disposition: A | Discharge status: Home / Self Care | Payer: Self-pay | Source: Ambulatory Visit | Attending: Cardiovascular Disease | Admitting: Cardiovascular Disease

## 2018-05-15 ENCOUNTER — Ambulatory Visit (HOSPITAL_COMMUNITY)
Admission: RE | Admit: 2018-05-15 | Discharge: 2018-05-15 | Disposition: A | Discharge status: Home / Self Care | Payer: Self-pay | Source: Ambulatory Visit | Attending: Cardiovascular Disease | Admitting: Cardiovascular Disease

## 2018-05-15 DIAGNOSIS — I421 Obstructive hypertrophic cardiomyopathy: Secondary | ICD-10-CM

## 2018-05-15 LAB — POCT I-STAT CREATININE: Creatinine, Ser: 0.8 mg/dL (ref 0.61–1.24)

## 2018-05-15 MED ORDER — GADOBUTROL 1 MMOL/ML IV SOLN
17.0000 mL | Freq: Once | INTRAVENOUS | Status: AC | PRN
  Administered 2018-05-15: 17 mL via INTRAVENOUS

## 2018-05-16 ENCOUNTER — Encounter (HOSPITAL_COMMUNITY): Payer: Self-pay

## 2018-05-19 ENCOUNTER — Encounter (HOSPITAL_COMMUNITY)
Admission: RE | Admit: 2018-05-19 | Discharge: 2018-05-19 | Disposition: A | Discharge status: Home / Self Care | Payer: Self-pay | Source: Ambulatory Visit | Attending: Cardiovascular Disease | Admitting: Cardiovascular Disease

## 2018-05-20 ENCOUNTER — Other Ambulatory Visit: Payer: Medicaid Other

## 2018-05-20 DIAGNOSIS — E119 Type 2 diabetes mellitus without complications: Secondary | ICD-10-CM

## 2018-05-21 ENCOUNTER — Encounter (HOSPITAL_COMMUNITY): Payer: Self-pay

## 2018-05-21 ENCOUNTER — Encounter: Payer: Self-pay | Admitting: Cardiovascular Disease

## 2018-05-21 ENCOUNTER — Ambulatory Visit (INDEPENDENT_AMBULATORY_CARE_PROVIDER_SITE_OTHER): Payer: Self-pay | Admitting: Cardiovascular Disease

## 2018-05-21 VITALS — BP 108/80 | HR 96 | Ht 72.0 in | Wt 243.0 lb

## 2018-05-21 DIAGNOSIS — I5022 Chronic systolic (congestive) heart failure: Secondary | ICD-10-CM

## 2018-05-21 DIAGNOSIS — Z794 Long term (current) use of insulin: Secondary | ICD-10-CM

## 2018-05-21 DIAGNOSIS — I25118 Atherosclerotic heart disease of native coronary artery with other forms of angina pectoris: Secondary | ICD-10-CM

## 2018-05-21 DIAGNOSIS — E118 Type 2 diabetes mellitus with unspecified complications: Secondary | ICD-10-CM

## 2018-05-21 DIAGNOSIS — E782 Mixed hyperlipidemia: Secondary | ICD-10-CM

## 2018-05-21 LAB — HGB A1C W/O EAG: Hgb A1c MFr Bld: 7.1 % — ABNORMAL HIGH (ref 4.8–5.6)

## 2018-05-21 MED ORDER — SACUBITRIL-VALSARTAN 24-26 MG PO TABS: 1.0000 | ORAL_TABLET | Freq: Two times a day (BID) | ORAL | 0 refills | Status: DC

## 2018-05-21 NOTE — Progress Notes (Signed)
Cardiology Office Note:    Date:  05/21/2018   ID:  Allen Cox, DOB 1971-02-02, MRN [id number]  PCP:  Mack Hook, MD  Cardiologist:  Sherren Mocha, MD  Electrophysiologist:  None   Referring MD: No ref. provider found   Chief Complaint  Patient presents with  . Follow-up    CHF    History of Present Illness:    Allen Cox is a 47 y.o. male with a hx of CAD who presented with non-STEMI in May 2019 and was found to have severe multivessel coronary artery disease.  He was ultimately treated with multivessel CABG complicated by postoperative ventricular tachycardia requiring shock and CPR.  He was subsequently discharged on amiodarone which has been discontinued now.  He was last seen in our office on Jan 30, 2018.  He's here alone today. Concerned primarily about weight gain and insomnia. Didn't sleep at all last night. He's walking with cardiac rehab and tolerating this well. He has had symptoms of orthopnea, PND, and chest discomfort. Denies exertional dyspnea with normal activities or with cardiac rehab. He has been having problems with depression and is working with a Engineer, water.    Past Medical History:  Diagnosis Date  . CAD (coronary artery disease) of bypass graft   . DM (diabetes mellitus) ([place])   . HLD (hyperlipidemia)   . Ischemic cardiomyopathy   . Tobacco use   . VT (ventricular tachycardia) ([place])     Past Surgical History:  Procedure Laterality Date  . CORONARY ARTERY BYPASS GRAFT N/A 01/08/2018   Procedure: CORONARY ARTERY BYPASS GRAFTING (CABG) x five, using Left Internal Mammary Artery and Right Leg Greater Saphenous Vein harvested endoscopically;  Surgeon: Melrose Nakayama, MD;  Location: [facility];  Service: Open Heart Surgery;  Laterality: N/A;  . LEFT HEART CATH AND CORONARY ANGIOGRAPHY N/A 01/03/2018   Procedure: LEFT HEART CATH AND CORONARY ANGIOGRAPHY;  Surgeon: Jettie Booze, MD;  Location: [place] [facility];  Service:  Cardiovascular;  Laterality: N/A;  . TEE WITHOUT CARDIOVERSION N/A 01/08/2018   Procedure: TRANSESOPHAGEAL ECHOCARDIOGRAM (TEE);  Surgeon: Melrose Nakayama, MD;  Location: [facility];  Service: Open Heart Surgery;  Laterality: N/A;  . WRIST ARTHROPLASTY Right     Current Medications: Current Meds  Medication Sig  . aspirin EC 81 MG tablet Take 1 tablet (81 mg total) by mouth daily.  Marland Kitchen atorvastatin (LIPITOR) 80 MG tablet Take 1 tablet (80 mg total) by mouth at bedtime.  . blood glucose meter kit and supplies KIT Dispense based on patient and insurance preference. Use up to four times daily as directed. (FOR ICD-9 250.00, 250.01).  . carvedilol (COREG) 6.25 MG tablet Take 1 tablet (6.25 mg total) by mouth 2 (two) times daily.  . clopidogrel (PLAVIX) 75 MG tablet Take 1 tablet (75 mg total) by mouth daily.  Marland Kitchen glipiZIDE (GLUCOTROL) 10 MG tablet Take 1 tablet (10 mg total) by mouth 2 (two) times daily before a meal.  . glucose blood (TRUE METRIX BLOOD GLUCOSE TEST) test strip Check blood glucose twice daily before meals  . Lancets MISC Check blood glucose twice daily before meals  . metFORMIN (GLUCOPHAGE) 1000 MG tablet Take 1 tablet (1,000 mg total) by mouth 2 (two) times daily with a meal.  . [DISCONTINUED] oxyCODONE (OXY IR/ROXICODONE) 5 MG immediate release tablet Take 5 mg by mouth every 4 (four) hours as needed for severe pain.     Allergies:   Patient has no known allergies.  Social History   Socioeconomic History  . Marital status: Single    Spouse name: Not on file  . Number of children: Not on file  . Years of education: Not on file  . Highest education level: Not on file  Occupational History  . Not on file  Social Needs  . Financial resource strain: Not on file  . Food insecurity:    Worry: Never true    Inability: Never true  . Transportation needs:    Medical: No    Non-medical: No  Tobacco Use  . Smoking status: Former Smoker    Types: Cigars, Pipe    Last attempt  to quit: 09/04/1987    Years since quitting: 30.7  . Smokeless tobacco: Never Used  Substance and Sexual Activity  . Alcohol use: Not Currently  . Drug use: Never  . Sexual activity: Not on file  Lifestyle  . Physical activity:    Days per week: Not on file    Minutes per session: Not on file  . Stress: Not at all  Relationships  . Social connections:    Talks on phone: Not on file    Gets together: Not on file    Attends religious service: Not on file    Active member of club or organization: Not on file    Attends meetings of clubs or organizations: Not on file    Relationship status: Not on file  Other Topics Concern  . Not on file  Social History Narrative  . Not on file     Family History: The patient's family history includes Stroke in his sister. There is no history of CAD.  ROS:   Please see the history of present illness.    Positive for weight gain, chest pain, orthopnea, depression, fatigue, leg pain, anxiety, balance problems.  All other systems reviewed and are negative.  EKGs/Labs/Other Studies Reviewed:    The following studies were reviewed today: Cardiac MRI 05-15-2018: FINDINGS: 1. Mildly dilated left ventricle with normal wall thickness and severely decreased systolic function (LVEF = 34%). There is hypokinesis in the mid anteroseptal, anterior and apical septal, anterior walls and in the true apex.  There is late gadolinium enhancement in the following segments: Mid anterior and anteroseptal walls (25-50%), apical septal and anterior walls (50-75%) and true apex (75-100%).  LVEDD: 60 mm  LVESD: 50 mm  LVEDV: 221 ml  LVESV: 147 ml  SV: 74 ml  Myocardial mass: 162 g  2. Normal right ventricular size, thickness and systolic function (LVEF = 48%). There are no regional wall motion abnormalities.  3.  Normal left and right atrial size.  4. Normal size of the aortic root, ascending aorta and pulmonary artery.  5.  No significant  valvular abnormalities.  6.  Normal pericardium.  No pericardial effusion.  IMPRESSION: 1. Mildly dilated left ventricle with normal wall thickness and severely decreased systolic function (LVEF = 34%). There is hypokinesis in the mid anteroseptal, anterior and apical septal, anterior walls and in the true apex.  There is late gadolinium enhancement in the following segments: Mid anterior and anteroseptal walls (25-50%), apical septal and anterior walls (50-75%) and true apex (75-100%).  2. Normal right ventricular size, thickness and systolic function (LVEF = 48%). There are no regional wall motion abnormalities.  3.  Normal left and right atrial size.  4. Normal size of the aortic root, ascending aorta and pulmonary artery.  No improvement of LVEF since the last echocardiogram on 02/13/2018, an  ICD implantation is recommended.  Cardiac Cath 01/03/2018: Conclusion     Post Atrio lesion is 100% stenosed.  Dist RCA lesion is 25% stenosed.  Mid RCA lesion is 60% stenosed.  Prox RCA lesion is 25% stenosed.  Prox Cx to Mid Cx lesion is 75% stenosed.  Ost 1st Mrg to 1st Mrg lesion is 75% stenosed.  Ost 2nd Mrg to 2nd Mrg lesion is 40% stenosed.  Prox LAD to Mid LAD lesion is 99% stenosed.  2nd Diag lesion is 60% stenosed.  There is moderate left ventricular systolic dysfunction.  The left ventricular ejection fraction is 25-35% by visual estimate.  LV end diastolic pressure is mildly elevated.  There is no aortic valve stenosis.   Severe three vessel disease detected in the setting of NSTEMI.  Likely uncontrolled diabetic.  Will obtain cardiac surgery consult.     Echo 02/13/2018: Study Conclusions  - Left ventricle: The cavity size was normal. Wall thickness was   increased in a pattern of moderate LVH. Systolic function was   moderately to severely reduced. The estimated ejection fraction   was in the range of 30% to 35%. Severe hypokinesis of the    mid-apicalapical myocardium. Features are consistent with a   pseudonormal left ventricular filling pattern, with concomitant   abnormal relaxation and increased filling pressure (grade 2   diastolic dysfunction).  CABG OPERATIVE REPORT DATE OF PROCEDURE:  01/08/2018  PREOPERATIVE DIAGNOSIS:  Three-vessel coronary artery disease, status post non-ST elevation myocardial infarction.  POSTOPERATIVE DIAGNOSIS:  Three-vessel coronary artery disease, status post non-ST elevation myocardial infarction.  PROCEDURES PERFORMED:  Median sternotomy, extracorporeal circulation, Coronary artery bypass grafting x 5      Left internal mammary artery to LAD,      Sequential saphenous vein graft to obtuse marginal 1 and 2,      Saphenous vein graft to posterior descending,      Saphenous vein  graft to diagonal), Endoscopic vein harvest of right leg, Placement of intraaortic balloon pump via left femoral artery.  EKG:  EKG is not ordered today.  Recent Labs: 01/03/2018: TSH 0.867 01/09/2018: Magnesium 1.7 01/15/2018: BUN 12; Hemoglobin 10.0; Platelets 608; Potassium 3.8; Sodium 130 03/13/2018: ALT 19 05/15/2018: Creatinine, Ser 0.80  Recent Lipid Panel    Component Value Date/Time   CHOL 188 03/13/2018 0852   TRIG 49 03/13/2018 0852   HDL 53 03/13/2018 0852   CHOLHDL 3.5 03/13/2018 0852   CHOLHDL 5.6 01/04/2018 0433   VLDL 19 01/04/2018 0433   LDLCALC 125 (H) 03/13/2018 0852    Physical Exam:    VS:  BP 108/80   Pulse 96   Ht 6' (1.829 m)   Wt 243 lb (110.2 kg)   SpO2 96%   BMI 32.96 kg/m     Wt Readings from Last 3 Encounters:  05/21/18 243 lb (110.2 kg)  03/19/18 239 lb (108.4 kg)  03/11/18 231 lb 9.6 oz (105.1 kg)     GEN:  Well nourished, well developed in no acute distress HEENT: Normal NECK: No JVD; No carotid bruits LYMPHATICS: No lymphadenopathy CARDIAC: RRR, no murmurs, rubs, gallops RESPIRATORY:  Clear to auscultation without rales, wheezing or rhonchi  ABDOMEN:  Soft, non-tender, non-distended MUSCULOSKELETAL:  No edema; No deformity  SKIN: Warm and dry NEUROLOGIC:  Alert and oriented x 3 PSYCHIATRIC:  Normal affect   ASSESSMENT:    1. Coronary artery disease of native artery of native heart with stable angina pectoris ([place])   2. Chronic systolic heart  failure ([place])   3. Type 2 diabetes mellitus with complication, with long-term current use of insulin ([place])   4. Mixed hyperlipidemia    PLAN:    In order of problems listed above:  1. The patient will remain on dual antiplatelet therapy with aspirin and clopidogrel as well as aggressive lipid-lowering with atorvastatin 80 mg daily.  He is on a beta-blocker and ACE inhibitor.  See below for further discussion. 2. I reviewed his recent cardiac MRI results with him.  His LVEF is calculated at 34%.  He is not yet optimized on medical therapy.  We discussed consideration of EP referral for an ICD versus continued medication adjustment/titration and reassessment of LV function in about 3 months.  The patient favors the latter approach.  Will discontinue lisinopril and start him on Entresto.  Early follow-up will be arranged with consideration of increasing his carvedilol, titrating Entresto, and adding spironolactone as tolerated. 3. Managed by his primary care physician.  Currently taking metformin 1000 mg twice daily.  Advised him that at some of his GI symptoms might be related to this.  He will continue to follow regularly and will work on lifestyle modification. 4. Treated with atorvastatin 80 mg daily.  Lifestyle modification discussed.  Lipids from March 13, 2018 reviewed.  May need to consider additional therapy such as a PCSK9 inhibitor or ezetimibe.   Medication Adjustments/Labs and Tests Ordered: Current medicines are reviewed at length with the patient today.  Concerns regarding medicines are outlined above.  No orders of the defined types were placed in this encounter.  Meds ordered this  encounter  Medications  . sacubitril-valsartan (ENTRESTO) 24-26 MG    Sig: Take 1 tablet by mouth 2 (two) times daily.    Dispense:  60 tablet    Refill:  0    Patient Instructions  Medication Instructions:  1) STOP LISINOPRIL 2) START ENTRESTO 24-26 mg twice daily on Friday morning  Labwork: None  Testing/Procedures: None  Follow-Up: You have an appointment scheduled with Dr. Antionette Char assistant, Vin, on Wednesday, October 2nd at 11:00AM. Please arrive by 10:45AM.   Any Other Special Instructions Will Be Listed Below (If Applicable).     If you need a refill on your cardiac medications before your next appointment, please call your pharmacy.      Signed, Sherren Mocha, MD  05/21/2018 5:47 PM    [facility]

## 2018-05-21 NOTE — Patient Instructions (Addendum)
Medication Instructions:  1) STOP LISINOPRIL 2) START ENTRESTO 24-26 mg twice daily on Friday morning  Labwork: None  Testing/Procedures: None  Follow-Up: You have an appointment scheduled with Dr. Earmon Phoenixooper's assistant, Vin, on Wednesday, October 2nd at 11:00AM. Please arrive by 10:45AM.   Any Other Special Instructions Will Be Listed Below (If Applicable).     If you need a refill on your cardiac medications before your next appointment, please call your pharmacy.

## 2018-05-22 ENCOUNTER — Encounter: Payer: Self-pay | Admitting: Physician Assistant

## 2018-05-23 ENCOUNTER — Ambulatory Visit: Payer: Medicaid Other | Admitting: Internal Medicine

## 2018-05-23 ENCOUNTER — Encounter (HOSPITAL_COMMUNITY)
Admission: RE | Admit: 2018-05-23 | Discharge: 2018-05-23 | Disposition: A | Discharge status: Home / Self Care | Payer: Medicaid Other | Source: Ambulatory Visit | Attending: Cardiovascular Disease | Admitting: Cardiovascular Disease

## 2018-05-23 ENCOUNTER — Encounter: Payer: Self-pay | Admitting: Internal Medicine

## 2018-05-23 VITALS — BP 122/80 | HR 80 | Resp 12 | Ht 72.0 in | Wt 240.0 lb

## 2018-05-23 DIAGNOSIS — E782 Mixed hyperlipidemia: Secondary | ICD-10-CM

## 2018-05-23 DIAGNOSIS — F329 Major depressive disorder, single episode, unspecified: Secondary | ICD-10-CM

## 2018-05-23 DIAGNOSIS — E119 Type 2 diabetes mellitus without complications: Secondary | ICD-10-CM

## 2018-05-23 DIAGNOSIS — I255 Ischemic cardiomyopathy: Secondary | ICD-10-CM

## 2018-05-23 MED ORDER — CITALOPRAM HYDROBROMIDE 10 MG PO TABS: 10.0000 mg | ORAL_TABLET | Freq: Every day | ORAL | 1 refills | Status: DC

## 2018-05-23 NOTE — Progress Notes (Signed)
Subjective:    Patient ID: Allen Cox, male    DOB: [birth year], 47 y.o.   MRN: [id number]  HPI  1.  DM:  Off Metformin only yesterday as he confused stopping Lisinopril with Metformin.  Discussed he was to stop the Lisinopril instead.   A1C yesterday was down significantly from 12.7 to 7.1%.  Discussed almost at goal, which is less than 7.0% Has made significant changes with diet.  He is also working on cardiac rehab  2.  Cardiomyopathy:  EF still in low 30s and switched to [facility].  He has samples for now.  Going back for recheck in 2 weeks, when Carvedilol or Entresto may titrated higher or spironolactone increased; with Cardiac MR repeat in 3 months.     3.  Hyperlipidemia:  Taking 80 mg of Atorvastatin 80 mg daily.  He was to start Zetia some time ago.  States there was a mix up with [place] and he has not started. Scheduled for FLP on the 26th with cardiology.  4.  By the way:  Depression:  As finishing up with patient, asked about any depression.  He states he is depressed.  Loves being up high, part of [place] in [place] city, and looking out over the tops of buildings, etc. He walks regularly to downtown parking decks to look out over [place].  Has had the voice in his head (clarifies not hearing voices, this is just a thought that comes to him) that says "jump".  States he would never actually consider doing this.  Current Meds  Medication Sig  . aspirin EC 81 MG tablet Take 1 tablet (81 mg total) by mouth daily.  Marland Kitchen atorvastatin (LIPITOR) 80 MG tablet Take 1 tablet (80 mg total) by mouth at bedtime.  . blood glucose meter kit and supplies KIT Dispense based on patient and insurance preference. Use up to four times daily as directed. (FOR ICD-9 250.00, 250.01).  . carvedilol (COREG) 6.25 MG tablet Take 1 tablet (6.25 mg total) by mouth 2 (two) times daily.  . clopidogrel (PLAVIX) 75 MG tablet Take 1 tablet (75 mg total) by mouth daily.  Marland Kitchen  glipiZIDE (GLUCOTROL) 10 MG tablet Take 1 tablet (10 mg total) by mouth 2 (two) times daily before a meal.  . glucose blood (TRUE METRIX BLOOD GLUCOSE TEST) test strip Check blood glucose twice daily before meals  . Lancets MISC Check blood glucose twice daily before meals  . sacubitril-valsartan (ENTRESTO) 24-26 MG Take 1 tablet by mouth 2 (two) times daily.    No Known Allergies   Review of Systems     Objective:   Physical Exam  NAD Lungs:  CTA CV: RRR without murmur or rub.  Radial and DP pulses normal and equal.  Trace edema of ankles. Abd:  S, NT, No HSM or mass, + BS      Assessment & Plan:  1.  DM:  Much improved control.  After long discussion regarding diet and physical activity, he feels he can make some changes with lifestyle to get his A1C under 7.0%. Encouraged flu vaccine at the next orange card sign up or to go to his favorite pharmacy to obtain. Restart Metformin and stop Lisinopril  2.  Mixed hyperlipidemia:  Discussed he needs to notify cardiology that he has not yet started the Zetia before his next fasting lipids and to go pick up and get started.  3.  Ischemic cardiomyopathy:  As per cardiology.  Plans for medication titration depending on whether shows improvement.  Cardiac MR planned in 3 months.  4.  Depression:  Denies active thoughts of suicide or definite plan.  Asked him to not go to parking deck for now until follow up. Needs counseling with warm hand off to SW intern, Georgette Roberts-Collie. Start Citalopram 10 mg daily with followup in 1 week. To call or go to ED if suicidal thoughts

## 2018-05-25 ENCOUNTER — Encounter: Payer: Self-pay | Admitting: Internal Medicine

## 2018-05-26 ENCOUNTER — Encounter (HOSPITAL_COMMUNITY)
Admission: RE | Admit: 2018-05-26 | Discharge: 2018-05-26 | Disposition: A | Discharge status: Home / Self Care | Payer: Medicaid Other | Source: Ambulatory Visit | Attending: Cardiovascular Disease | Admitting: Cardiovascular Disease

## 2018-05-28 ENCOUNTER — Encounter (HOSPITAL_COMMUNITY)
Admission: RE | Admit: 2018-05-28 | Discharge: 2018-05-28 | Disposition: A | Discharge status: Home / Self Care | Payer: Self-pay | Source: Ambulatory Visit | Attending: Cardiovascular Disease | Admitting: Cardiovascular Disease

## 2018-05-28 NOTE — Progress Notes (Signed)
Social work Theatre manager, Astronomer, met with Mr. Allen Cox following a warm hand off from Dr. Amil Amen. He expressed that he has some anxiety and depression concerning his health, after his heart surgery, especially since he has been in great health most of his life. SWI scheduled counseling with him for June 06, 2018.

## 2018-05-29 ENCOUNTER — Other Ambulatory Visit: Payer: Self-pay

## 2018-05-30 ENCOUNTER — Ambulatory Visit: Payer: Medicaid Other | Admitting: Internal Medicine

## 2018-05-30 ENCOUNTER — Other Ambulatory Visit: Payer: Self-pay | Admitting: Surgical

## 2018-05-30 ENCOUNTER — Encounter (HOSPITAL_COMMUNITY)
Admission: RE | Admit: 2018-05-30 | Discharge: 2018-05-30 | Disposition: A | Discharge status: Home / Self Care | Payer: Self-pay | Source: Ambulatory Visit | Attending: Cardiovascular Disease | Admitting: Cardiovascular Disease

## 2018-06-02 ENCOUNTER — Encounter (HOSPITAL_COMMUNITY)
Admission: RE | Admit: 2018-06-02 | Discharge: 2018-06-02 | Disposition: A | Discharge status: Home / Self Care | Payer: Self-pay | Source: Ambulatory Visit | Attending: Cardiovascular Disease | Admitting: Cardiovascular Disease

## 2018-06-03 DIAGNOSIS — I255 Ischemic cardiomyopathy: Secondary | ICD-10-CM | POA: Insufficient documentation

## 2018-06-03 DIAGNOSIS — F329 Major depressive disorder, single episode, unspecified: Secondary | ICD-10-CM | POA: Insufficient documentation

## 2018-06-04 ENCOUNTER — Ambulatory Visit: Payer: Self-pay | Admitting: Physician Assistant

## 2018-06-04 ENCOUNTER — Encounter (HOSPITAL_COMMUNITY)
Admission: RE | Admit: 2018-06-04 | Discharge: 2018-06-04 | Disposition: A | Discharge status: Home / Self Care | Payer: Self-pay | Source: Ambulatory Visit | Attending: Cardiovascular Disease | Admitting: Cardiovascular Disease

## 2018-06-04 DIAGNOSIS — Z951 Presence of aortocoronary bypass graft: Secondary | ICD-10-CM | POA: Insufficient documentation

## 2018-06-04 DIAGNOSIS — I252 Old myocardial infarction: Secondary | ICD-10-CM | POA: Insufficient documentation

## 2018-06-06 ENCOUNTER — Encounter (HOSPITAL_COMMUNITY): Payer: Self-pay

## 2018-06-06 ENCOUNTER — Other Ambulatory Visit: Payer: Self-pay | Admitting: Licensed Clinical Social Worker

## 2018-06-09 ENCOUNTER — Encounter (HOSPITAL_COMMUNITY)
Admission: RE | Admit: 2018-06-09 | Discharge: 2018-06-09 | Disposition: A | Discharge status: Home / Self Care | Payer: Medicaid Other | Source: Ambulatory Visit | Attending: Cardiovascular Disease | Admitting: Cardiovascular Disease

## 2018-06-10 ENCOUNTER — Other Ambulatory Visit: Payer: Self-pay

## 2018-06-10 ENCOUNTER — Telehealth: Payer: Self-pay | Admitting: Internal Medicine

## 2018-06-10 MED ORDER — METFORMIN HCL 1000 MG PO TABS: 1000.0000 mg | ORAL_TABLET | Freq: Two times a day (BID) | ORAL | 5 refills | Status: DC

## 2018-06-10 NOTE — Telephone Encounter (Signed)
Please notify patient Rx for Metformin was sent to [facility] on [address]

## 2018-06-10 NOTE — Telephone Encounter (Signed)
Patient wants to know if doctor can prescribe metformin for him.

## 2018-06-11 ENCOUNTER — Encounter (HOSPITAL_COMMUNITY)
Admission: RE | Admit: 2018-06-11 | Discharge: 2018-06-11 | Disposition: A | Discharge status: Home / Self Care | Payer: Self-pay | Source: Ambulatory Visit | Attending: Cardiovascular Disease | Admitting: Cardiovascular Disease

## 2018-06-11 ENCOUNTER — Telehealth: Payer: Self-pay | Admitting: Internal Medicine

## 2018-06-11 NOTE — Telephone Encounter (Signed)
Pat called law office of [facility] [employer] to ensure they received requested medical records and billing summary.  Pat spoke with Ronie Spies on 06/11/2018 and was told they did receive the records.

## 2018-06-13 ENCOUNTER — Other Ambulatory Visit: Payer: Self-pay | Admitting: Licensed Clinical Social Worker

## 2018-06-13 ENCOUNTER — Encounter (HOSPITAL_COMMUNITY): Payer: Self-pay

## 2018-06-16 ENCOUNTER — Encounter (HOSPITAL_COMMUNITY)
Admission: RE | Admit: 2018-06-16 | Discharge: 2018-06-16 | Disposition: A | Discharge status: Home / Self Care | Payer: Medicaid Other | Source: Ambulatory Visit | Attending: Cardiovascular Disease | Admitting: Cardiovascular Disease

## 2018-06-18 ENCOUNTER — Ambulatory Visit: Payer: Medicaid Other | Admitting: Internal Medicine

## 2018-06-18 ENCOUNTER — Encounter (HOSPITAL_COMMUNITY)
Admission: RE | Admit: 2018-06-18 | Discharge: 2018-06-18 | Disposition: A | Discharge status: Home / Self Care | Payer: Medicaid Other | Source: Ambulatory Visit | Attending: Cardiovascular Disease | Admitting: Cardiovascular Disease

## 2018-06-20 ENCOUNTER — Encounter (HOSPITAL_COMMUNITY)
Admission: RE | Admit: 2018-06-20 | Discharge: 2018-06-20 | Disposition: A | Discharge status: Home / Self Care | Payer: Medicaid Other | Source: Ambulatory Visit | Attending: Cardiovascular Disease | Admitting: Cardiovascular Disease

## 2018-06-23 ENCOUNTER — Encounter (HOSPITAL_COMMUNITY): Payer: Self-pay

## 2018-06-25 ENCOUNTER — Encounter (HOSPITAL_COMMUNITY): Payer: Self-pay

## 2018-06-27 ENCOUNTER — Encounter (HOSPITAL_COMMUNITY)
Admission: RE | Admit: 2018-06-27 | Discharge: 2018-06-27 | Disposition: A | Discharge status: Home / Self Care | Payer: Self-pay | Source: Ambulatory Visit | Attending: Cardiovascular Disease | Admitting: Cardiovascular Disease

## 2018-06-30 ENCOUNTER — Encounter (HOSPITAL_COMMUNITY)
Admission: RE | Admit: 2018-06-30 | Discharge: 2018-06-30 | Disposition: A | Discharge status: Home / Self Care | Payer: Self-pay | Source: Ambulatory Visit | Attending: Cardiovascular Disease | Admitting: Cardiovascular Disease

## 2018-07-01 ENCOUNTER — Ambulatory Visit: Payer: Medicaid Other | Admitting: Internal Medicine

## 2018-07-02 ENCOUNTER — Encounter (HOSPITAL_COMMUNITY): Payer: Self-pay

## 2018-07-03 ENCOUNTER — Encounter (INDEPENDENT_AMBULATORY_CARE_PROVIDER_SITE_OTHER): Payer: Self-pay

## 2018-07-03 ENCOUNTER — Other Ambulatory Visit: Payer: Self-pay

## 2018-07-03 DIAGNOSIS — E782 Mixed hyperlipidemia: Secondary | ICD-10-CM

## 2018-07-03 DIAGNOSIS — Z79899 Other long term (current) drug therapy: Secondary | ICD-10-CM

## 2018-07-03 LAB — HEPATIC FUNCTION PANEL
ALBUMIN: 4.3 g/dL (ref 3.5–5.5)
ALK PHOS: 87 IU/L (ref 39–117)
ALT: 24 IU/L (ref 0–44)
AST: 18 IU/L (ref 0–40)
BILIRUBIN, DIRECT: 0.1 mg/dL (ref 0.00–0.40)
Bilirubin Total: 0.3 mg/dL (ref 0.0–1.2)
Total Protein: 6.6 g/dL (ref 6.0–8.5)

## 2018-07-03 LAB — LIPID PANEL
CHOL/HDL RATIO: 2.9 ratio (ref 0.0–5.0)
CHOLESTEROL TOTAL: 173 mg/dL (ref 100–199)
HDL: 59 mg/dL (ref >39)
LDL Calculated: 105 mg/dL — ABNORMAL HIGH (ref 0–99)
TRIGLYCERIDES: 45 mg/dL (ref 0–149)
VLDL Cholesterol Cal: 9 mg/dL (ref 5–40)

## 2018-07-04 ENCOUNTER — Encounter (HOSPITAL_COMMUNITY)
Admission: RE | Admit: 2018-07-04 | Discharge: 2018-07-04 | Disposition: A | Discharge status: Home / Self Care | Payer: Self-pay | Source: Ambulatory Visit | Attending: Cardiovascular Disease | Admitting: Cardiovascular Disease

## 2018-07-04 DIAGNOSIS — Z951 Presence of aortocoronary bypass graft: Secondary | ICD-10-CM | POA: Insufficient documentation

## 2018-07-04 DIAGNOSIS — I252 Old myocardial infarction: Secondary | ICD-10-CM | POA: Insufficient documentation

## 2018-07-06 NOTE — Progress Notes (Signed)
Cardiology Office Note    Date:  07/07/2018   ID:  Allen Cox, DOB 07-Sep-1970, MRN [id number]  PCP:  Mack Hook, MD  Cardiologist: Dr. Burt Knack  Chief Complaint: Medication management  History of Present Illness:   Allen Cox is a 47 y.o. male with history of CAD status post CABG, ischemic cardiomyopathy/chronic systolic heart failure, diabetes and hyperlipidemia presents for follow-up.  Presented with non-STEMI in May 2019 and was found to have severe multivessel coronary artery disease.  He was ultimately treated with multivessel CABG complicated by postoperative ventricular tachycardia requiring shock and CPR.  He was subsequently discharged on amiodarone which has been discontinued now.   Last seen by Dr. Burt Knack 05/21/18 for cardiac MRI follow-up which showed Mildly dilated left ventricle with normal wall thickness and severely decreased systolic function (LVEF = 34%). There is hypokinesis in the mid anteroseptal, anterior and apical septal, anterior walls and in the true apex.  Discussed EP referral for ICD consideration.  Eventually decided medical therapy up titration.  Discontinued lisinopril and started on Entresto.  Plan to add Spironolactone as well.  He missed appointment about a month ago and now presenting for follow-up.  He run out of entresto samples 10 days ago. BP of 102/78 today . Was running 95s/70s while on entresto. Asymptomatic. No chest pain, SOB, palpitation, orthopnea, PND, syncope, LE edema or melena.    Past Medical History:  Diagnosis Date  . CAD (coronary artery disease) of bypass graft   . DM (diabetes mellitus) ([place])   . HLD (hyperlipidemia)   . Ischemic cardiomyopathy   . Tobacco use   . VT (ventricular tachycardia) ([place])     Past Surgical History:  Procedure Laterality Date  . CORONARY ARTERY BYPASS GRAFT N/A 01/08/2018   Procedure: CORONARY ARTERY BYPASS GRAFTING (CABG) x five, using Left Internal Mammary Artery and Right Leg  Greater Saphenous Vein harvested endoscopically;  Surgeon: Melrose Nakayama, MD;  Location: [facility];  Service: Open Heart Surgery;  Laterality: N/A;  . LEFT HEART CATH AND CORONARY ANGIOGRAPHY N/A 01/03/2018   Procedure: LEFT HEART CATH AND CORONARY ANGIOGRAPHY;  Surgeon: Jettie Booze, MD;  Location: [facility];  Service: Cardiovascular;  Laterality: N/A;  . TEE WITHOUT CARDIOVERSION N/A 01/08/2018   Procedure: TRANSESOPHAGEAL ECHOCARDIOGRAM (TEE);  Surgeon: Melrose Nakayama, MD;  Location: [facility];  Service: Open Heart Surgery;  Laterality: N/A;  . WRIST ARTHROPLASTY Right     Current Medications: Prior to Admission medications   Medication Sig Start Date End Date Taking? Authorizing Provider  aspirin EC 81 MG tablet Take 1 tablet (81 mg total) by mouth daily. 01/30/18   Elizer Bostic, Crista Luria, PA  atorvastatin (LIPITOR) 80 MG tablet Take 1 tablet (80 mg total) by mouth at bedtime. 01/16/18   Gold, Patrick Jupiter E, PA-C  blood glucose meter kit and supplies KIT Dispense based on patient and insurance preference. Use up to four times daily as directed. (FOR ICD-9 250.00, 250.01). 01/21/18   Melrose Nakayama, MD  carvedilol (COREG) 6.25 MG tablet Take 1 tablet (6.25 mg total) by mouth 2 (two) times daily. 01/30/18   Trayvion Embleton, Crista Luria, PA  citalopram (CELEXA) 10 MG tablet Take 1 tablet (10 mg total) by mouth daily. 05/23/18   Mack Hook, MD  clopidogrel (PLAVIX) 75 MG tablet Take 1 tablet (75 mg total) by mouth daily. 01/30/18   Leanor Kail, PA  ezetimibe (ZETIA) 10 MG tablet Take 1 tablet (10 mg total) by mouth daily. Patient  not taking: Reported on 05/21/2018 03/27/18   Sherren Mocha, MD  glipiZIDE (GLUCOTROL) 10 MG tablet Take 1 tablet (10 mg total) by mouth 2 (two) times daily before a meal. 02/11/18   Mack Hook, MD  glucose blood (TRUE METRIX BLOOD GLUCOSE TEST) test strip Check blood glucose twice daily before meals 02/11/18   Mack Hook, MD  Lancets  MISC Check blood glucose twice daily before meals 02/11/18   Mack Hook, MD  metFORMIN (GLUCOPHAGE) 1000 MG tablet Take 1 tablet (1,000 mg total) by mouth 2 (two) times daily with a meal. 06/10/18   Mack Hook, MD  sacubitril-valsartan (ENTRESTO) 24-26 MG Take 1 tablet by mouth 2 (two) times daily. 05/21/18   Sherren Mocha, MD    Allergies:   Patient has no known allergies.   Social History   Socioeconomic History  . Marital status: Single    Spouse name: Not on file  . Number of children: Not on file  . Years of education: Not on file  . Highest education level: Not on file  Occupational History  . Not on file  Social Needs  . Financial resource strain: Not on file  . Food insecurity:    Worry: Never true    Inability: Never true  . Transportation needs:    Medical: No    Non-medical: No  Tobacco Use  . Smoking status: Former Smoker    Types: Cigars, Pipe    Last attempt to quit: 09/04/1987    Years since quitting: 30.8  . Smokeless tobacco: Never Used  Substance and Sexual Activity  . Alcohol use: Not Currently  . Drug use: Never  . Sexual activity: Not on file  Lifestyle  . Physical activity:    Days per week: Not on file    Minutes per session: Not on file  . Stress: Not at all  Relationships  . Social connections:    Talks on phone: Not on file    Gets together: Not on file    Attends religious service: Not on file    Active member of club or organization: Not on file    Attends meetings of clubs or organizations: Not on file    Relationship status: Not on file  Other Topics Concern  . Not on file  Social History Narrative  . Not on file     Family History:  The patient's family history includes Stroke in his sister.   ROS:   Please see the history of present illness.    ROS All other systems reviewed and are negative.   PHYSICAL EXAM:   VS:  BP 102/78   Pulse 90   Ht 6' (1.829 m)   Wt 251 lb 1.9 oz (113.9 kg)   SpO2 96%   BMI  34.06 kg/m    GEN: Well nourished, well developed, in no acute distress  HEENT: normal  Neck: no JVD, carotid bruits, or masses Cardiac: RRR; no murmurs, rubs, or gallops,no edema  Respiratory:  clear to auscultation bilaterally, normal work of breathing GI: soft, nontender, nondistended, + BS MS: no deformity or atrophy  Skin: warm and dry, no rash Neuro:  Alert and Oriented x 3, Strength and sensation are intact Psych: euthymic mood, full affect  Wt Readings from Last 3 Encounters:  07/07/18 251 lb 1.9 oz (113.9 kg)  05/23/18 240 lb (108.9 kg)  05/21/18 243 lb (110.2 kg)      Studies/Labs Reviewed:   EKG:  EKG is not ordered today.  Recent Labs: 01/03/2018: TSH 0.867 01/09/2018: Magnesium 1.7 01/15/2018: BUN 12; Hemoglobin 10.0; Platelets 608; Potassium 3.8; Sodium 130 05/15/2018: Creatinine, Ser 0.80 07/03/2018: ALT 24   Lipid Panel    Component Value Date/Time   CHOL 173 07/03/2018 0921   TRIG 45 07/03/2018 0921   HDL 59 07/03/2018 0921   CHOLHDL 2.9 07/03/2018 0921   CHOLHDL 5.6 01/04/2018 0433   VLDL 19 01/04/2018 0433   LDLCALC 105 (H) 07/03/2018 0921    Additional studies/ records that were reviewed today include:   Echocardiogram: 02/2018 Study Conclusions  - Left ventricle: The cavity size was normal. Wall thickness was   increased in a pattern of moderate LVH. Systolic function was   moderately to severely reduced. The estimated ejection fraction   was in the range of 30% to 35%. Severe hypokinesis of the   mid-apicalapical myocardium. Features are consistent with a   pseudonormal left ventricular filling pattern, with concomitant   abnormal relaxation and increased filling pressure (grade 2   diastolic dysfunction).    ASSESSMENT & PLAN:    1. CAD No angina. Continue ASA, Plavix, BB and statin.   2.  ISchemic cardiomyopathy/chronic systolic heart failure - Euvolemic. Continue coreg at current dose. Restart entresto 24-26. Samples and 30 days  card given. Cost in $279/month. He will apply for assistant program. Still not interested in ICD. IF not qualified, will change back to ARB with plan to add spironolactone.   3.  Hyperlipidemia -01/04/2018: VLDL 19 07/03/2018: Cholesterol, Total 173; HDL 59; LDL Calculated 105; Triglycerides 45  -LDL not at goal on Lipitor and Zetia (says takes his medications).  Will refer to lipid clinic.    Medication Adjustments/Labs and Tests Ordered: Current medicines are reviewed at length with the patient today.  Concerns regarding medicines are outlined above.  Medication changes, Labs and Tests ordered today are listed in the Patient Instructions below. Patient Instructions  Medication Instructions:  1.  RESTART Entresto 24/26 mg taking 1 twice a day.  IF YOUR INSURANCE DON'T COVER THIS, PLEASE CALL us BEFORE YOU LET YOURSELF RUN OUT OF IT.  If you need a refill on your cardiac medications before your next appointment, please call your pharmacy.   Lab work: None ordered  If you have labs (blood work) drawn today and your tests are completely normal, you will receive your results only by: Marland Kitchen MyChart Message (if you have MyChart) OR . A paper copy in the mail If you have any lab test that is abnormal or we need to change your treatment, we will call you to review the results.  Testing/Procedures: None ordered  You have been referred to [facility].  Follow-Up: Your physician recommends that you schedule a follow-up appointment in: [facility]  Your physician recommends that you schedule a follow-up appointment in: 3 MONTHS WITH DR. Burt Knack   Any Other Special Instructions Will Be Listed Below (If Applicable).       Jarrett Soho, [place]  07/07/2018 1:10 PM    [facility] [address], [place], [place]  [address] Phone: [phone]; Fax: [phone]

## 2018-07-07 ENCOUNTER — Ambulatory Visit (INDEPENDENT_AMBULATORY_CARE_PROVIDER_SITE_OTHER): Payer: Self-pay | Admitting: Physician Assistant

## 2018-07-07 ENCOUNTER — Encounter (HOSPITAL_COMMUNITY): Payer: Self-pay

## 2018-07-07 ENCOUNTER — Encounter: Payer: Self-pay | Admitting: Physician Assistant

## 2018-07-07 VITALS — BP 102/78 | HR 90 | Ht 72.0 in | Wt 251.1 lb

## 2018-07-07 DIAGNOSIS — E782 Mixed hyperlipidemia: Secondary | ICD-10-CM

## 2018-07-07 DIAGNOSIS — I25118 Atherosclerotic heart disease of native coronary artery with other forms of angina pectoris: Secondary | ICD-10-CM

## 2018-07-07 DIAGNOSIS — I5022 Chronic systolic (congestive) heart failure: Secondary | ICD-10-CM

## 2018-07-07 DIAGNOSIS — I255 Ischemic cardiomyopathy: Secondary | ICD-10-CM

## 2018-07-07 NOTE — Patient Instructions (Addendum)
Medication Instructions:  1.  RESTART Entresto 24/26 mg taking 1 twice a day.  IF YOUR INSURANCE DON'T COVER THIS, PLEASE CALL us BEFORE YOU LET YOURSELF RUN OUT OF IT.  If you need a refill on your cardiac medications before your next appointment, please call your pharmacy.   Lab work: None ordered  If you have labs (blood work) drawn today and your tests are completely normal, you will receive your results only by: Marland Kitchen MyChart Message (if you have MyChart) OR . A paper copy in the mail If you have any lab test that is abnormal or we need to change your treatment, we will call you to review the results.  Testing/Procedures: None ordered  You have been referred to THE LIPID CLINIC.  Follow-Up: Your physician recommends that you schedule a follow-up appointment in: IN THE BLOOD PRESSURE CLINIC IN 3-4 WEEKS FOR POSSIBLE MED TITRATION  Your physician recommends that you schedule a follow-up appointment in: 3 MONTHS WITH DR. Excell Seltzer   Any Other Special Instructions Will Be Listed Below (If Applicable).

## 2018-07-09 ENCOUNTER — Encounter (HOSPITAL_COMMUNITY)
Admission: RE | Admit: 2018-07-09 | Discharge: 2018-07-09 | Disposition: A | Discharge status: Home / Self Care | Payer: Self-pay | Source: Ambulatory Visit | Attending: Cardiovascular Disease | Admitting: Cardiovascular Disease

## 2018-07-11 ENCOUNTER — Encounter (HOSPITAL_COMMUNITY)
Admission: RE | Admit: 2018-07-11 | Discharge: 2018-07-11 | Disposition: A | Discharge status: Home / Self Care | Payer: Medicaid Other | Source: Ambulatory Visit | Attending: Cardiovascular Disease | Admitting: Cardiovascular Disease

## 2018-07-14 ENCOUNTER — Encounter (HOSPITAL_COMMUNITY)
Admission: RE | Admit: 2018-07-14 | Discharge: 2018-07-14 | Disposition: A | Discharge status: Home / Self Care | Payer: Medicaid Other | Source: Ambulatory Visit | Attending: Cardiovascular Disease | Admitting: Cardiovascular Disease

## 2018-07-16 ENCOUNTER — Encounter (HOSPITAL_COMMUNITY): Payer: Self-pay

## 2018-07-17 ENCOUNTER — Other Ambulatory Visit: Payer: Self-pay | Admitting: *Deleted

## 2018-07-17 ENCOUNTER — Telehealth: Payer: Self-pay | Admitting: Cardiovascular Disease

## 2018-07-17 MED ORDER — SACUBITRIL-VALSARTAN 24-26 MG PO TABS: 1.0000 | ORAL_TABLET | Freq: Two times a day (BID) | ORAL | 0 refills | Status: DC

## 2018-07-17 NOTE — Telephone Encounter (Signed)
I will refill the entresto for a one month supply as patient has an upcoming appointment with the pharmacist here in our office for possible med titration. Carvedilol has refills remaining.

## 2018-07-17 NOTE — Telephone Encounter (Signed)
New message         *STAT* If patient is at the pharmacy, call can be transferred to refill team.   1. Which medications need to be refilled? (please list name of each medication and dose if known) Entresto24-26mg /Carvedilol 3.125 mg  2. Which pharmacy/location (including street and city if local pharmacy) is medication to be sent to?[facility] [address]  3. Do they need a 30 day or 90 day supply?90 day on both

## 2018-07-18 ENCOUNTER — Encounter (HOSPITAL_COMMUNITY): Payer: Self-pay

## 2018-07-21 ENCOUNTER — Encounter (HOSPITAL_COMMUNITY): Payer: Self-pay

## 2018-07-22 ENCOUNTER — Other Ambulatory Visit: Payer: Self-pay | Admitting: Cardiovascular Disease

## 2018-07-22 NOTE — Telephone Encounter (Signed)
. °*  STAT* If patient is at the pharmacy, call can be transferred to refill team.   1. Which medications need to be refilled? (please list name of each medication and dose if known)  Carvedilol and Entresto  2. Which pharmacy/location (including street and city if local pharmacy) is medication to be sent to? [facility] [phone]  3. Do they need a 30 day or 90 day supply? Entresto#60 and Carvedilol #120

## 2018-07-23 ENCOUNTER — Encounter (HOSPITAL_COMMUNITY): Payer: Self-pay

## 2018-07-23 MED ORDER — SACUBITRIL-VALSARTAN 24-26 MG PO TABS: 1.0000 | ORAL_TABLET | Freq: Two times a day (BID) | ORAL | 3 refills | Status: DC

## 2018-07-23 MED ORDER — CARVEDILOL 6.25 MG PO TABS: 6.2500 mg | ORAL_TABLET | Freq: Two times a day (BID) | ORAL | 3 refills | Status: DC

## 2018-07-23 NOTE — Telephone Encounter (Signed)
Pt's medications were sent to pt's pharmacy as requested. Confirmation received.  

## 2018-07-25 ENCOUNTER — Ambulatory Visit (INDEPENDENT_AMBULATORY_CARE_PROVIDER_SITE_OTHER): Payer: Medicaid Other | Admitting: Internal Medicine

## 2018-07-25 ENCOUNTER — Encounter (HOSPITAL_COMMUNITY)
Admission: RE | Admit: 2018-07-25 | Discharge: 2018-07-25 | Disposition: A | Discharge status: Home / Self Care | Payer: Self-pay | Source: Ambulatory Visit | Attending: Cardiovascular Disease | Admitting: Cardiovascular Disease

## 2018-07-25 ENCOUNTER — Encounter: Payer: Self-pay | Admitting: Internal Medicine

## 2018-07-25 VITALS — BP 100/70 | HR 80 | Resp 12 | Ht 72.0 in | Wt 243.5 lb

## 2018-07-25 DIAGNOSIS — I255 Ischemic cardiomyopathy: Secondary | ICD-10-CM | POA: Diagnosis not present

## 2018-07-25 DIAGNOSIS — F329 Major depressive disorder, single episode, unspecified: Secondary | ICD-10-CM

## 2018-07-25 DIAGNOSIS — E782 Mixed hyperlipidemia: Secondary | ICD-10-CM | POA: Diagnosis not present

## 2018-07-25 DIAGNOSIS — E119 Type 2 diabetes mellitus without complications: Secondary | ICD-10-CM

## 2018-07-25 DIAGNOSIS — N529 Male erectile dysfunction, unspecified: Secondary | ICD-10-CM

## 2018-07-25 DIAGNOSIS — K9289 Other specified diseases of the digestive system: Secondary | ICD-10-CM

## 2018-07-25 MED ORDER — FLUOXETINE HCL 20 MG PO CAPS: 20.0000 mg | ORAL_CAPSULE | Freq: Every day | ORAL | 11 refills | Status: DC

## 2018-07-25 NOTE — Progress Notes (Signed)
Subjective:    Patient ID: Allen Cox, male    DOB: 27-Mar-1971, 47 y.o.   MRN: [id number]  HPI   1.  Depression:  Taking Citalopram 10 mg daily now for about 8 weeks.  Not clear any difference in how he feels.   Libido is normal, but having difficulties with erection.  These symptoms preceded start of Citalopram.  Was happening intermittently even before his MI and ischemic cardiomyopathy.  2.  Chain Burping:  Belching a lot over and over.  Stomach growling when not hungry.  No sense of heartburn or acid in mouth in morning.  Does not chew gum.  Does eat mints if out in public regularly.  They contain sorbitol .  3.  Cardiomyopathy:  In general, feels great when he is physically active and even more importantly, when sedentary and inside, feels less energetic.  He is going through cardiac rehab still.  He states he has no restrictions for activity.    4.  Hyperlipidemia:  He thinks he picked up Zetia, but will check meds and get back.  5.  DM:  Sugars generally 120 or lower in the morning.  Lowest has been in low 70s.  Sugars in the evening before dinner running 140s after cardiac rehab.  If not cardiac rehab, 130-160.   Has lost significant weight since last in as he was concerned with weight gain.  Current Meds  Medication Sig  . aspirin EC 81 MG tablet Take 1 tablet (81 mg total) by mouth daily.  Marland Kitchen atorvastatin (LIPITOR) 80 MG tablet Take 1 tablet (80 mg total) by mouth at bedtime.  . blood glucose meter kit and supplies KIT Dispense based on patient and insurance preference. Use up to four times daily as directed. (FOR ICD-9 250.00, 250.01).  . carvedilol (COREG) 6.25 MG tablet Take 1 tablet (6.25 mg total) by mouth 2 (two) times daily.  . citalopram (CELEXA) 10 MG tablet Take 1 tablet (10 mg total) by mouth daily.  . clopidogrel (PLAVIX) 75 MG tablet Take 1 tablet (75 mg total) by mouth daily.  Marland Kitchen ezetimibe (ZETIA) 10 MG tablet Take 1 tablet (10 mg total) by mouth daily.  Marland Kitchen  glipiZIDE (GLUCOTROL) 10 MG tablet Take 1 tablet (10 mg total) by mouth 2 (two) times daily before a meal.  . glucose blood (TRUE METRIX BLOOD GLUCOSE TEST) test strip Check blood glucose twice daily before meals  . Lancets MISC Check blood glucose twice daily before meals  . metFORMIN (GLUCOPHAGE) 1000 MG tablet Take 1 tablet (1,000 mg total) by mouth 2 (two) times daily with a meal.  . sacubitril-valsartan (ENTRESTO) 24-26 MG Take 1 tablet by mouth 2 (two) times daily.    No Known Allergies  Review of Systems     Objective:   Physical Exam   NAD Lungs:  CTA CV:  RRR without murmur or rub.  Radial and DP pulses normal and equal. Abd:  S, NT, No HSM or mass, + BS LE:  No edema     Assessment & Plan:  1.  Depression:  Mistakenly ordered Fluoxetine 20 mg.  Change to Citalopram, which he has been taking and increase to 20 mg daily. Followup in 1 month  2.  Gas:  Discussed getting away from eating a lot of sugar alcohol containing candies and gum to see if that improves these symptoms before considering medication.  3. Cardiomyopathy:  Continuing to improve risk factors.  4.  Hyperlipidemia:  He will check  on Zetia as he is to have FLP done with Cardiology soon.  5.  DM:  Sounds like he is having improving control.  Almost at goal in September.  3.

## 2018-07-28 ENCOUNTER — Encounter (HOSPITAL_COMMUNITY): Payer: Self-pay

## 2018-07-28 NOTE — Progress Notes (Unsigned)
Patient ID: Allen Cox.                 DOB: 1971/05/01                      MRN: [id number]     HPI: Allen Cox. is a 47 y.o. male patient of Dr. Burt Knack who presents today for lipid and hypertension evaluation. PMH significant for NSTEMI, CAD s/p CABG, ischemic cardiomyopathy, chronic CHF (LVEF 30-35%), DM, and HLD. At last visit with [facility], [facility], patient was referred to lipid clinic for elevated LDL on atorvastatin 2m plus ezetimibe 130mdaily. Most recently, patient was referred to HTN clinic as well for CHF medication titration.   - does patient have insurance   - low pressures - how is he feeling (dizziness, falls, etc)  HTN - add spironolactone vs titrate Entresto vs change to ARB and add spironolactone - BP last visit 102/78 (lower when on Entresto - had not been taking at last visit)  HLD - rosuvastatin vs PCSK9i    Current HTN meds:  Carvedilol 6.2566mID Sacubitril-valsartan 24-47m71mD Previously tried: lisinopril 2.5mg 53mly   BP goal: <130/80 mmHg  Current HLD Medications: atorvastatin 80mg 69my, ezetimibe 10mg d9m Intolerances:   Risk Factors: CAD, HTN LDL Goal: <70 mg/dL   Family History: ***  Social History: former smoker (quit 1989), denies alcohol use, denies illicit drug use  Diet: ***  Exercise: ***  Home BP readings:   Labs: 07/03/18: TC 173, HDL 59, LDL 105, TG 45   Wt Readings from Last 3 Encounters:  07/25/18 243 lb 8 oz (110.5 kg)  07/07/18 251 lb 1.9 oz (113.9 kg)  05/23/18 240 lb (108.9 kg)   BP Readings from Last 3 Encounters:  07/25/18 100/70  07/07/18 102/78  05/23/18 122/80   Pulse Readings from Last 3 Encounters:  07/25/18 80  07/07/18 90  05/23/18 80   Renal function: CrCl cannot be calculated (Patient's most recent lab result is older than the maximum 21 days allowed.).  Past Medical History:  Diagnosis Date  . CAD (coronary artery disease) of bypass graft   . DM (diabetes mellitus) ([facility])   [place] (hyperlipidemia)   . Ischemic cardiomyopathy   . Tobacco use   . VT (ventricular tachycardia) ([facility])   [facility] on File Prior to Visit  Medication Sig Dispense Refill  . aspirin EC 81 MG tablet Take 1 tablet (81 mg total) by mouth daily. 90 tablet 3  . atorvastatin (LIPITOR) 80 MG tablet Take 1 tablet (80 mg total) by mouth at bedtime. 30 tablet 1  . blood glucose meter kit and supplies KIT Dispense based on patient and insurance preference. Use up to four times daily as directed. (FOR ICD-9 250.00, 250.01). 1 each 1  . carvedilol (COREG) 6.25 MG tablet Take 1 tablet (6.25 mg total) by mouth 2 (two) times daily. 180 tablet 3  . citalopram (CELEXA) 10 MG tablet Take 1 tablet (10 mg total) by mouth daily. 30 tablet 1  . clopidogrel (PLAVIX) 75 MG tablet Take 1 tablet (75 mg total) by mouth daily. 90 tablet 3  . ezetimibe (ZETIA) 10 MG tablet Take 1 tablet (10 mg total) by mouth daily. 90 tablet 3  . FLUoxetine (PROZAC) 20 MG capsule Take 1 capsule (20 mg total) by mouth daily. 30 capsule 11  . glipiZIDE (GLUCOTROL) 10 MG tablet Take 1 tablet (10 mg total) by  mouth 2 (two) times daily before a meal. 60 tablet 11  . glucose blood (TRUE METRIX BLOOD GLUCOSE TEST) test strip Check blood glucose twice daily before meals 100 each 11  . Lancets MISC Check blood glucose twice daily before meals 100 each 11  . metFORMIN (GLUCOPHAGE) 1000 MG tablet Take 1 tablet (1,000 mg total) by mouth 2 (two) times daily with a meal. 60 tablet 5  . sacubitril-valsartan (ENTRESTO) 24-26 MG Take 1 tablet by mouth 2 (two) times daily. 180 tablet 3   No current facility-administered medications on file prior to visit.     No Known Allergies  Assessment/Plan: Hypertension (CHF medication titration?):  Hyperlipidemia:    Cleotis Lema, PharmD Student 07/28/18 8:51 PM

## 2018-07-29 ENCOUNTER — Ambulatory Visit: Payer: Self-pay | Admitting: Pharmacist

## 2018-07-30 ENCOUNTER — Encounter (HOSPITAL_COMMUNITY): Payer: Self-pay

## 2018-08-04 ENCOUNTER — Encounter (HOSPITAL_COMMUNITY): Payer: Self-pay | Attending: Cardiovascular Disease

## 2018-08-04 ENCOUNTER — Telehealth: Payer: Self-pay | Admitting: Internal Medicine

## 2018-08-04 DIAGNOSIS — Z951 Presence of aortocoronary bypass graft: Secondary | ICD-10-CM | POA: Insufficient documentation

## 2018-08-04 DIAGNOSIS — I252 Old myocardial infarction: Secondary | ICD-10-CM | POA: Insufficient documentation

## 2018-08-04 NOTE — Telephone Encounter (Signed)
[facility] [address] For citalopram (CELEXA) 10 MG tablet   please inform pt. Once Rx is sent over

## 2018-08-05 MED ORDER — CITALOPRAM HYDROBROMIDE 20 MG PO TABS: 20.0000 mg | ORAL_TABLET | Freq: Every day | ORAL | 11 refills | Status: DC

## 2018-08-05 NOTE — Telephone Encounter (Signed)
Left detailed message for patient. Rx increased to 20 mg per conversation at last OV and [facility]

## 2018-08-06 ENCOUNTER — Encounter (HOSPITAL_COMMUNITY): Payer: Self-pay

## 2018-08-08 ENCOUNTER — Encounter (HOSPITAL_COMMUNITY): Payer: Self-pay

## 2018-08-11 ENCOUNTER — Encounter (HOSPITAL_COMMUNITY): Payer: Self-pay

## 2018-08-13 ENCOUNTER — Encounter (HOSPITAL_COMMUNITY): Payer: Self-pay

## 2018-08-15 ENCOUNTER — Encounter (HOSPITAL_COMMUNITY): Payer: Self-pay

## 2018-08-18 ENCOUNTER — Encounter (HOSPITAL_COMMUNITY): Payer: Self-pay

## 2018-08-20 ENCOUNTER — Encounter (HOSPITAL_COMMUNITY): Payer: Self-pay

## 2018-08-22 ENCOUNTER — Encounter (HOSPITAL_COMMUNITY): Payer: Self-pay

## 2018-08-25 ENCOUNTER — Encounter (HOSPITAL_COMMUNITY): Payer: Self-pay

## 2018-08-26 ENCOUNTER — Other Ambulatory Visit: Payer: Self-pay | Admitting: Surgical

## 2018-08-29 ENCOUNTER — Telehealth (HOSPITAL_COMMUNITY): Payer: Self-pay | Admitting: *Deleted

## 2018-08-29 ENCOUNTER — Encounter (HOSPITAL_COMMUNITY): Payer: Self-pay

## 2018-09-01 ENCOUNTER — Other Ambulatory Visit: Payer: Self-pay

## 2018-09-01 ENCOUNTER — Encounter (HOSPITAL_COMMUNITY): Payer: Self-pay

## 2018-09-01 MED ORDER — ATORVASTATIN CALCIUM 80 MG PO TABS: 80.0000 mg | ORAL_TABLET | Freq: Every day | ORAL | 11 refills | Status: DC

## 2018-09-02 ENCOUNTER — Telehealth (HOSPITAL_COMMUNITY): Payer: Self-pay | Admitting: *Deleted

## 2018-09-05 ENCOUNTER — Ambulatory Visit: Payer: Medicaid Other | Admitting: Internal Medicine

## 2018-09-22 ENCOUNTER — Encounter: Payer: Self-pay | Admitting: Internal Medicine

## 2018-09-23 ENCOUNTER — Telehealth: Payer: Self-pay | Admitting: Internal Medicine

## 2018-09-23 NOTE — Telephone Encounter (Signed)
Patient called wants Dr. Delrae AlfredMulberry to know that he is doing ok.  Please call if any further questions. [phone].

## 2018-10-09 ENCOUNTER — Encounter (INDEPENDENT_AMBULATORY_CARE_PROVIDER_SITE_OTHER): Payer: Self-pay

## 2018-10-09 ENCOUNTER — Ambulatory Visit (INDEPENDENT_AMBULATORY_CARE_PROVIDER_SITE_OTHER): Payer: Self-pay | Admitting: Cardiovascular Disease

## 2018-10-09 ENCOUNTER — Ambulatory Visit: Payer: Self-pay | Admitting: Cardiovascular Disease

## 2018-10-09 ENCOUNTER — Encounter: Payer: Self-pay | Admitting: Cardiovascular Disease

## 2018-10-09 VITALS — BP 110/64 | HR 72 | Ht 72.0 in | Wt 257.8 lb

## 2018-10-09 DIAGNOSIS — I2510 Atherosclerotic heart disease of native coronary artery without angina pectoris: Secondary | ICD-10-CM

## 2018-10-09 DIAGNOSIS — E782 Mixed hyperlipidemia: Secondary | ICD-10-CM

## 2018-10-09 DIAGNOSIS — I5022 Chronic systolic (congestive) heart failure: Secondary | ICD-10-CM

## 2018-10-09 MED ORDER — SILDENAFIL CITRATE 50 MG PO TABS: 50.0000 mg | ORAL_TABLET | Freq: Every day | ORAL | 1 refills | Status: DC | PRN

## 2018-10-09 MED ORDER — CARVEDILOL 12.5 MG PO TABS: 12.5000 mg | ORAL_TABLET | Freq: Two times a day (BID) | ORAL | 3 refills | Status: DC

## 2018-10-09 NOTE — Progress Notes (Signed)
Cardiology Office Note:    Date:  10/10/2018   ID:  [name], DOB 1971/02/20, MRN [id number]  PCP:  Mack Hook, MD  Cardiologist:  Sherren Mocha, MD  Electrophysiologist:  None   Referring MD: Mack Hook, MD   Chief Complaint  Patient presents with  . Coronary Artery Disease    History of Present Illness:    Allen L Marquest Gunkel. is a 48 y.o. male with a hx of coronary artery disease status post CABG, chronic systolic heart failure secondary to ischemic cardiomyopathy, diabetes, and hyperlipidemia, presenting for follow-up evaluation.  The patient was found to have severe multivessel coronary artery disease at the time of cardiac catheterization in 2019 when he presented with non-STEMI.  He underwent CABG complicated by postoperative ventricular tachycardia.  He has now recovered and in September 2019 he underwent cardiac MRI demonstrating mildly dilated LV with normal wall thickness and severely reduced LV function with an ejection fraction of 34%.  EP referral was considered but the patient preferred escalation of medical therapy.  He was started on Entresto.  At the time of his last visit in November 2019 he was having issues with Delene Loll cost which was $279 per month.  He was not interested in pursuing EP referral for ICD at the time of his last visit.  The patient is here alone today.  He is doing well and has no specific complaints with physical activity.  He is exercising intermittently with no exertional symptoms.  He denies chest pain, chest pressure, shortness of breath, leg swelling, heart palpitations, lightheadedness, orthopnea, or PND.  He does admit to some balance issues but denies feeling lightheaded or weak.  His diabetes is been under better control.  He is tolerating his medications without specific side effects.  He does complain of erectile dysfunction which predated initiation of his cardiac medications.  Past Medical History:  Diagnosis Date    . CAD (coronary artery disease) of bypass graft   . DM (diabetes mellitus) ([place])   . HLD (hyperlipidemia)   . Ischemic cardiomyopathy   . Tobacco use   . VT (ventricular tachycardia) ([place])     Past Surgical History:  Procedure Laterality Date  . CORONARY ARTERY BYPASS GRAFT N/A 01/08/2018   Procedure: CORONARY ARTERY BYPASS GRAFTING (CABG) x five, using Left Internal Mammary Artery and Right Leg Greater Saphenous Vein harvested endoscopically;  Surgeon: Melrose Nakayama, MD;  Location: [place];  Service: Open Heart Surgery;  Laterality: N/A;  . LEFT HEART CATH AND CORONARY ANGIOGRAPHY N/A 01/03/2018   Procedure: LEFT HEART CATH AND CORONARY ANGIOGRAPHY;  Surgeon: Jettie Booze, MD;  Location: [place] [facility];  Service: Cardiovascular;  Laterality: N/A;  . TEE WITHOUT CARDIOVERSION N/A 01/08/2018   Procedure: TRANSESOPHAGEAL ECHOCARDIOGRAM (TEE);  Surgeon: Melrose Nakayama, MD;  Location: [place];  Service: Open Heart Surgery;  Laterality: N/A;  . WRIST ARTHROPLASTY Right     Current Medications: Current Meds  Medication Sig  . aspirin EC 81 MG tablet Take 1 tablet (81 mg total) by mouth daily.  Marland Kitchen atorvastatin (LIPITOR) 80 MG tablet Take 1 tablet (80 mg total) by mouth at bedtime.  . blood glucose meter kit and supplies KIT Dispense based on patient and insurance preference. Use up to four times daily as directed. (FOR ICD-9 250.00, 250.01).  . carvedilol (COREG) 12.5 MG tablet Take 1 tablet (12.5 mg total) by mouth 2 (two) times daily.  . citalopram (CELEXA) 20 MG tablet Take 1  tablet (20 mg total) by mouth daily.  . clopidogrel (PLAVIX) 75 MG tablet Take 1 tablet (75 mg total) by mouth daily.  Marland Kitchen ezetimibe (ZETIA) 10 MG tablet Take 1 tablet (10 mg total) by mouth daily.  Marland Kitchen glipiZIDE (GLUCOTROL) 10 MG tablet Take 1 tablet (10 mg total) by mouth 2 (two) times daily before a meal.  . glucose blood (TRUE METRIX BLOOD GLUCOSE TEST) test strip Check blood glucose twice daily  before meals  . Lancets MISC Check blood glucose twice daily before meals  . metFORMIN (GLUCOPHAGE) 1000 MG tablet Take 1 tablet (1,000 mg total) by mouth 2 (two) times daily with a meal.  . sacubitril-valsartan (ENTRESTO) 24-26 MG Take 1 tablet by mouth 2 (two) times daily.  . [DISCONTINUED] carvedilol (COREG) 6.25 MG tablet Take 1 tablet (6.25 mg total) by mouth 2 (two) times daily.     Allergies:   Patient has no known allergies.   Social History   Socioeconomic History  . Marital status: Single    Spouse name: Not on file  . Number of children: Not on file  . Years of education: Not on file  . Highest education level: Not on file  Occupational History  . Not on file  Social Needs  . Financial resource strain: Not on file  . Food insecurity:    Worry: Never true    Inability: Never true  . Transportation needs:    Medical: No    Non-medical: No  Tobacco Use  . Smoking status: Former Smoker    Types: Cigars, Pipe    Last attempt to quit: 09/04/1987    Years since quitting: 31.1  . Smokeless tobacco: Never Used  Substance and Sexual Activity  . Alcohol use: Not Currently  . Drug use: Never  . Sexual activity: Not on file  Lifestyle  . Physical activity:    Days per week: Not on file    Minutes per session: Not on file  . Stress: Not at all  Relationships  . Social connections:    Talks on phone: Not on file    Gets together: Not on file    Attends religious service: Not on file    Active member of club or organization: Not on file    Attends meetings of clubs or organizations: Not on file    Relationship status: Not on file  Other Topics Concern  . Not on file  Social History Narrative  . Not on file     Family History: The patient's family history includes Stroke in his sister. There is no history of CAD.  ROS:   Please see the history of present illness.    Erectile dysfunction, depression, anxiety.  All other systems reviewed and are  negative.  EKGs/Labs/Other Studies Reviewed:    The following studies were reviewed today: Cardiac MRI 05-15-2018: FINDINGS: 1. Mildly dilated left ventricle with normal wall thickness and severely decreased systolic function (LVEF = 34%). There is hypokinesis in the mid anteroseptal, anterior and apical septal, anterior walls and in the true apex.  There is late gadolinium enhancement in the following segments: Mid anterior and anteroseptal walls (25-50%), apical septal and anterior walls (50-75%) and true apex (75-100%).  LVEDD: 60 mm  LVESD: 50 mm  LVEDV: 221 ml  LVESV: 147 ml  SV: 74 ml  Myocardial mass: 162 g  2. Normal right ventricular size, thickness and systolic function (LVEF = 48%). There are no regional wall motion abnormalities.  3.  Normal  left and right atrial size.  4. Normal size of the aortic root, ascending aorta and pulmonary artery.  5.  No significant valvular abnormalities.  6.  Normal pericardium.  No pericardial effusion.  IMPRESSION: 1. Mildly dilated left ventricle with normal wall thickness and severely decreased systolic function (LVEF = 34%). There is hypokinesis in the mid anteroseptal, anterior and apical septal, anterior walls and in the true apex.  There is late gadolinium enhancement in the following segments: Mid anterior and anteroseptal walls (25-50%), apical septal and anterior walls (50-75%) and true apex (75-100%).  2. Normal right ventricular size, thickness and systolic function (LVEF = 48%). There are no regional wall motion abnormalities.  3.  Normal left and right atrial size.  4. Normal size of the aortic root, ascending aorta and pulmonary artery.  No improvement of LVEF since the last echocardiogram on 02/13/2018, an ICD implantation is recommended.  Echo 02-13-2018: Left ventricle:  The cavity size was normal. Wall thickness was increased in a pattern of moderate LVH. Systolic function  was moderately to severely reduced. The estimated ejection fraction was in the range of 30% to 35%.  Regional wall motion abnormalities: Severe hypokinesis of the mid-apicalapical myocardium. Features are consistent with a pseudonormal left ventricular filling pattern, with concomitant abnormal relaxation and increased filling pressure (grade 2 diastolic dysfunction).  ------------------------------------------------------------------- Aortic valve:   Structurally normal valve.   Cusp separation was normal.  Doppler:  Transvalvular velocity was within the normal range. There was no stenosis. There was no regurgitation.  ------------------------------------------------------------------- Aorta:  Aortic root: The aortic root was normal in size. Ascending aorta: The ascending aorta was normal in size.  ------------------------------------------------------------------- Mitral valve:   Structurally normal valve.   Leaflet separation was normal.  Doppler:  Transvalvular velocity was within the normal range. There was no evidence for stenosis. There was trivial regurgitation.    Peak gradient (D): 2 mm Hg.  ------------------------------------------------------------------- Left atrium:  The atrium was normal in size.  ------------------------------------------------------------------- Right ventricle:  The cavity size was normal. Systolic function was normal.  ------------------------------------------------------------------- Pulmonic valve:    The valve appears to be grossly normal. Doppler:  There was no significant regurgitation.  ------------------------------------------------------------------- Pulmonary artery:   Systolic pressure was within the normal range.   ------------------------------------------------------------------- Right atrium:  The atrium was normal in size.  ------------------------------------------------------------------- Systemic  veins: Inferior vena cava: The vessel was normal in size. The respirophasic diameter changes were in the normal range (= 50%), consistent with normal central venous pressure. Diameter: 14 mm.  Cardiac Catheterization 01/03/2018: Conclusion     Post Atrio lesion is 100% stenosed.  Dist RCA lesion is 25% stenosed.  Mid RCA lesion is 60% stenosed.  Prox RCA lesion is 25% stenosed.  Prox Cx to Mid Cx lesion is 75% stenosed.  Ost 1st Mrg to 1st Mrg lesion is 75% stenosed.  Ost 2nd Mrg to 2nd Mrg lesion is 40% stenosed.  Prox LAD to Mid LAD lesion is 99% stenosed.  2nd Diag lesion is 60% stenosed.  There is moderate left ventricular systolic dysfunction.  The left ventricular ejection fraction is 25-35% by visual estimate.  LV end diastolic pressure is mildly elevated.  There is no aortic valve stenosis.   Severe three vessel disease detected in the setting of NSTEMI.  Likely uncontrolled diabetic.  Will obtain cardiac surgery consult.      Cardiac Catheterization 01-03-2018: Coronary Findings   Diagnostic  Dominance: Right  Left Anterior Descending  Prox LAD to Mid LAD  lesion 99% stenosed  Prox LAD to Mid LAD lesion is 99% stenosed. The lesion is heavily thrombotic.  Second Diagonal Branch  Vessel is small in size.  2nd Diag lesion 60% stenosed  2nd Diag lesion is 60% stenosed.  Second Septal Branch  Collaterals  2nd Sept filled by collaterals from RPDA.    Left Circumflex  Prox Cx to Mid Cx lesion 75% stenosed  Prox Cx to Mid Cx lesion is 75% stenosed.  First Obtuse Marginal Branch  Ost 1st Mrg to 1st Mrg lesion 75% stenosed  Ost 1st Mrg to 1st Mrg lesion is 75% stenosed.  Second Obtuse Marginal Branch  Ost 2nd Mrg to 2nd Mrg lesion 40% stenosed  Ost 2nd Mrg to 2nd Mrg lesion is 40% stenosed.  Right Coronary Artery  The vessel is ectatic.  Prox RCA lesion 25% stenosed  Prox RCA lesion is 25% stenosed.  Mid RCA lesion 60% stenosed  Mid RCA lesion is 60%  stenosed.  Dist RCA lesion 25% stenosed  Dist RCA lesion is 25% stenosed.  Right Posterior Atrioventricular Branch  Collaterals  Post Atrio filled by collaterals from RPDA.    Post Atrio lesion 100% stenosed  Post Atrio lesion is 100% stenosed.  Intervention   No interventions have been documented.  Wall Motion   Resting               Left Heart   Left Ventricle The left ventricular size is in the upper limits of normal. There is moderate left ventricular systolic dysfunction. LV end diastolic pressure is mildly elevated. The left ventricular ejection fraction is 25-35% by visual estimate. There are LV function abnormalities due to segmental dysfunction.  Aortic Valve There is no aortic valve stenosis.  Coronary Diagrams   Diagnostic  Dominance: Right      EKG:  EKG is not ordered today.    Recent Labs: 01/03/2018: TSH 0.867 01/09/2018: Magnesium 1.7 01/15/2018: BUN 12; Hemoglobin 10.0; Platelets 608; Potassium 3.8; Sodium 130 05/15/2018: Creatinine, Ser 0.80 07/03/2018: ALT 24  Recent Lipid Panel    Component Value Date/Time   CHOL 173 07/03/2018 0921   TRIG 45 07/03/2018 0921   HDL 59 07/03/2018 0921   CHOLHDL 2.9 07/03/2018 0921   CHOLHDL 5.6 01/04/2018 0433   VLDL 19 01/04/2018 0433   LDLCALC 105 (H) 07/03/2018 0921    Physical Exam:    VS:  BP 110/64   Pulse 72   Ht 6' (1.829 m)   Wt 257 lb 12.8 oz (116.9 kg)   SpO2 98%   BMI 34.96 kg/m     Wt Readings from Last 3 Encounters:  10/09/18 257 lb 12.8 oz (116.9 kg)  07/25/18 243 lb 8 oz (110.5 kg)  07/07/18 251 lb 1.9 oz (113.9 kg)     GEN: Well nourished, well developed in no acute distress HEENT: Normal NECK: No JVD; No carotid bruits LYMPHATICS: No lymphadenopathy CARDIAC: RRR, no murmurs, rubs, gallops RESPIRATORY:  Clear to auscultation without rales, wheezing or rhonchi  ABDOMEN: Soft, non-tender, non-distended MUSCULOSKELETAL:  No edema; No deformity  SKIN: Warm and dry NEUROLOGIC:  Alert  and oriented x 3 PSYCHIATRIC:  Normal affect   ASSESSMENT:    1. Chronic systolic heart failure ([place])   2. Coronary artery disease involving native coronary artery of native heart without angina pectoris   3. Mixed hyperlipidemia    PLAN:    In order of problems listed above:  1. The patient has severe LV dysfunction after presenting with  severe multivessel coronary artery disease last year.  Even with revascularization his LVEF remains less than 35%.  Fortunately he has minimal symptoms and really no signs of congestive heart failure at this time.  I have asked him to increase his carvedilol with a dose of 9.375 mg twice daily x2 weeks then 12.5 mg twice daily thereafter.  He will remain on his current dose of Entresto.  I would like him to return in 3 months with an echocardiogram and APP follow-up.  He has not been interested in EP evaluation for ICD in the past. 2. The patient is stable without symptoms of angina.  He is treated with dual antiplatelet therapy using aspirin and clopidogrel, high intensity statin drug, and a beta-blocker. 3. Lipids reviewed.  His LDL cholesterol was above goal on atorvastatin 80 mg.  Zetia has been added to his drug regimen.  I am going to recheck lipids and LFTs and if he remains above goal I will refer him to the lipid clinic for further evaluation and consideration of PCSK9 inhibitor therapy.   Medication Adjustments/Labs and Tests Ordered: Current medicines are reviewed at length with the patient today.  Concerns regarding medicines are outlined above.  Orders Placed This Encounter  Procedures  . Lipid panel  . Hepatic function panel  . ECHOCARDIOGRAM COMPLETE   Meds ordered this encounter  Medications  . sildenafil (VIAGRA) 50 MG tablet    Sig: Take 1 tablet (50 mg total) by mouth daily as needed for erectile dysfunction.    Dispense:  10 tablet    Refill:  1  . carvedilol (COREG) 12.5 MG tablet    Sig: Take 1 tablet (12.5 mg total) by mouth 2  (two) times daily.    Dispense:  180 tablet    Refill:  3    Patient Instructions  Medication Instructions:  1) INCREASE COREG to 1.5 tablets (9.375 mg) twice daily for two weeks. Then INCREASE COREG to 12.5 mg twice daily 2) you have been given a prescription for SILDENAFIL.  You may take 1 tablet daily as needed prior to sexual activity.   Labwork: Your provider recommends that you return for FASTING lab work.  Testing/Procedures: Your provider has requested that you have an echocardiogram in 3 months. Echocardiography is a painless test that uses sound waves to create images of your heart. It provides your doctor with information about the size and shape of your heart and how well your heart's chambers and valves are working. This procedure takes approximately one hour. There are no restrictions for this procedure.  Follow-Up: Your provider recommends that you schedule a follow-up appointment in 3 months with Allen Dopp, PA.       Signed, Sherren Mocha, MD  10/10/2018 11:13 AM    [place]

## 2018-10-09 NOTE — Patient Instructions (Addendum)
Medication Instructions:  1) INCREASE COREG to 1.5 tablets (9.375 mg) twice daily for two weeks. Then INCREASE COREG to 12.5 mg twice daily 2) you have been given a prescription for SILDENAFIL.  You may take 1 tablet daily as needed prior to sexual activity.   Labwork: Your provider recommends that you return for FASTING lab work.  Testing/Procedures: Your provider has requested that you have an echocardiogram in 3 months. Echocardiography is a painless test that uses sound waves to create images of your heart. It provides your doctor with information about the size and shape of your heart and how well your heart's chambers and valves are working. This procedure takes approximately one hour. There are no restrictions for this procedure.  Follow-Up: Your provider recommends that you schedule a follow-up appointment in 3 months with Tereso Newcomer, PA.

## 2018-10-10 ENCOUNTER — Encounter: Payer: Self-pay | Admitting: Cardiovascular Disease

## 2018-10-13 ENCOUNTER — Encounter: Payer: Self-pay | Admitting: Internal Medicine

## 2018-10-13 ENCOUNTER — Other Ambulatory Visit: Payer: No Typology Code available for payment source | Admitting: *Deleted

## 2018-10-13 ENCOUNTER — Ambulatory Visit (INDEPENDENT_AMBULATORY_CARE_PROVIDER_SITE_OTHER): Payer: BLUE CROSS/BLUE SHIELD | Admitting: Internal Medicine

## 2018-10-13 VITALS — BP 124/82 | HR 62 | Resp 12 | Ht 72.0 in | Wt 251.0 lb

## 2018-10-13 DIAGNOSIS — E119 Type 2 diabetes mellitus without complications: Secondary | ICD-10-CM | POA: Diagnosis not present

## 2018-10-13 DIAGNOSIS — N529 Male erectile dysfunction, unspecified: Secondary | ICD-10-CM | POA: Diagnosis not present

## 2018-10-13 DIAGNOSIS — K9289 Other specified diseases of the digestive system: Secondary | ICD-10-CM

## 2018-10-13 DIAGNOSIS — F329 Major depressive disorder, single episode, unspecified: Secondary | ICD-10-CM

## 2018-10-13 DIAGNOSIS — E782 Mixed hyperlipidemia: Secondary | ICD-10-CM

## 2018-10-13 DIAGNOSIS — I2510 Atherosclerotic heart disease of native coronary artery without angina pectoris: Secondary | ICD-10-CM

## 2018-10-13 DIAGNOSIS — F4310 Post-traumatic stress disorder, unspecified: Secondary | ICD-10-CM

## 2018-10-13 NOTE — Progress Notes (Signed)
Subjective:    Patient ID: Allen Cox., male   DOB: 09-09-1970, 48 y.o.   MRN: [id number]   HPI   1.  Depression:  Increasing the Citalopram 20 mg has helped.  He feels less "draggy", more fluid.   Has not returned to the parking deck to be in a high position and consider jumping.  States he hasn't thought about it as not going there now. Still with instances of depression. Has triggers.  Anything that makes him think about almost dying.  Goes through a montage of his life.  Feels like h is hit by a tsunami and feels like a shell of a person for a few moments.   He would be interested in considering work with SW intern for EMDR or similar as sounds like a form of PTSD.    2.  Still with libido, but with ED.  Has not really noted a change.  Able to get somewhat of an erection.  These symptoms started well before he started Citalopram.  Started after his hospitalization for CAD, though was not really sexually active for 3 + months prior to MI/CABG. He was prescribed by his cardiologist, Dr. Orion Crook to use as needed.  Aware of dangers of using NTG with Viagra and to notify EMS/providers if has taken Viagra if presents with chest pain.  3.  DM:  Sugars running in low 100s.  In general in 110 range.  May get up to 140 at times.  Can get down to 72 at times.  Feels shaky with that level. Does check feet nightly Walks daily Wants to check his ACA coverage to see if eye exam covered. Did not come in for free Influenza in the fall.  We do not have pneumococcal vaccine today.  4.  Hyperlipidemia:  Is taking Zetia and Atorvastatin now and has labs planned with Cardiology today.    5.  Gas:  Improved. Did stop the sugar free candies and gum and seems to be likely the cause of previous gas concerns.    Current Meds  Medication Sig  . aspirin EC 81 MG tablet Take 1 tablet (81 mg total) by mouth daily.  Marland Kitchen atorvastatin (LIPITOR) 80 MG tablet Take 1 tablet (80 mg total) by mouth at  bedtime.  . blood glucose meter kit and supplies KIT Dispense based on patient and insurance preference. Use up to four times daily as directed. (FOR ICD-9 250.00, 250.01).  . carvedilol (COREG) 12.5 MG tablet Take 1 tablet (12.5 mg total) by mouth 2 (two) times daily.  . citalopram (CELEXA) 20 MG tablet Take 1 tablet (20 mg total) by mouth daily.  . clopidogrel (PLAVIX) 75 MG tablet Take 1 tablet (75 mg total) by mouth daily.  Marland Kitchen ezetimibe (ZETIA) 10 MG tablet Take 1 tablet (10 mg total) by mouth daily.  Marland Kitchen glipiZIDE (GLUCOTROL) 10 MG tablet Take 1 tablet (10 mg total) by mouth 2 (two) times daily before a meal.  . glucose blood (TRUE METRIX BLOOD GLUCOSE TEST) test strip Check blood glucose twice daily before meals  . Lancets MISC Check blood glucose twice daily before meals  . metFORMIN (GLUCOPHAGE) 1000 MG tablet Take 1 tablet (1,000 mg total) by mouth 2 (two) times daily with a meal.  . sacubitril-valsartan (ENTRESTO) 24-26 MG Take 1 tablet by mouth 2 (two) times daily.  . sildenafil (VIAGRA) 50 MG tablet Take 1 tablet (50 mg total) by mouth daily as needed for erectile dysfunction.  No Known Allergies   Review of Systems    Objective:   BP 124/82 (BP Location: Left Arm, Patient Position: Sitting, Cuff Size: Normal)   Pulse 62   Resp 12   Ht 6' (1.829 m)   Wt 251 lb (113.9 kg)   BMI 34.04 kg/m   Physical Exam   NAD Seems happier today, more energetic HEENT:  PERRL, EOMI, TMs pearly gray, throat without injection Neck:  Supple No adenopathy Chest:  CTA CV: RRR with normal S1 and S2, No S3, S4 or murmur.  Radial and DP pulses normal and equal LE:  No edema.   Assessment & Plan   1.  Depression:  Improved. 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  Referral to SW intern for treatment.  Discussed EMDR and other modalities as possibilities.  2.  DM:  Sounds well controlled.  Asked to have cardiology add A1C today.  Foot exam next visit. He would like to  check with [employer] ACA for eye coverage costs.  Needs yearly eye check. Encouraged to obtain influenza and Pneumococcal 23 at [facility] or pharmacy if covered as we are out.  3.  CAD with revascularization:  Stable  4.  Hyperlipidemia:  Labs today with cardiology.  5.  ED:  Viagra.  6.  Gas:  Likely due to candies with non absorbable sugar alcohol..  Resolved

## 2018-10-13 NOTE — Patient Instructions (Signed)
Check with Cardiology to add an A1C to fasting labs today.  Check with ACA coverage regarding optometry ophthalmology exam with diabetes.  Need Pneumococcal 23 valent and influenza vaccine--check with PHD or pharmacy as to coverage.

## 2018-10-14 LAB — HEPATIC FUNCTION PANEL
ALT: 37 IU/L (ref 0–44)
AST: 19 IU/L (ref 0–40)
Albumin: 4.3 g/dL (ref 4.0–5.0)
Alkaline Phosphatase: 134 IU/L — ABNORMAL HIGH (ref 39–117)
BILIRUBIN TOTAL: 0.5 mg/dL (ref 0.0–1.2)
BILIRUBIN, DIRECT: 0.15 mg/dL (ref 0.00–0.40)
Total Protein: 7 g/dL (ref 6.0–8.5)

## 2018-10-14 LAB — LIPID PANEL
CHOL/HDL RATIO: 2.7 ratio (ref 0.0–5.0)
CHOLESTEROL TOTAL: 145 mg/dL (ref 100–199)
HDL: 53 mg/dL (ref >39)
LDL CALC: 78 mg/dL (ref 0–99)
TRIGLYCERIDES: 69 mg/dL (ref 0–149)
VLDL Cholesterol Cal: 14 mg/dL (ref 5–40)

## 2018-11-04 ENCOUNTER — Telehealth: Payer: Self-pay

## 2018-11-04 NOTE — Telephone Encounter (Signed)
**Note De-Identified Allen Cox Obfuscation** The pt has completed an [employer] pt asst application for Entrest[employer]. I have completed the provider part of the application and have placed it in Dr Randolm Idol mail bin awaiting his signature.

## 2018-11-07 ENCOUNTER — Telehealth: Payer: Self-pay

## 2018-11-07 NOTE — Telephone Encounter (Signed)
Received walk-in form that Mr. Allen Cox needs letter for work listing any restrictions.  Called patient to confirm what he does for work to write letter.  Left message to call back.

## 2018-11-07 NOTE — Telephone Encounter (Signed)
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 Occasionally, he has to restrain a combative child. The last one was 49 years old and "as strong as him."  He needs a letter from Dr. Excell Seltzer for his place of employment stating he can work and what restrictions he should have.  He requests a call once the letter is written and he will come pick it up.

## 2018-11-07 NOTE — Telephone Encounter (Signed)
Pt called  To return Katy's call.

## 2018-11-10 NOTE — Telephone Encounter (Signed)
**Note De-Identified Latrica Clowers Obfuscation** Dr Excell Seltzer has signed the [facility] pt asst application and I have faxed it to [facility].

## 2018-11-14 NOTE — Telephone Encounter (Signed)
**Note De-Identified Antonieta Slaven Obfuscation** Letter received from [facility] stating that they have enrolled the pt into the $10 co-pay assistance program. The pts co-pay will be $10 until he reaches the $2500.00 annual limit.  The letter states that they have notified the pt.

## 2018-11-27 ENCOUNTER — Encounter: Payer: Self-pay | Admitting: Cardiovascular Disease

## 2018-11-27 NOTE — Telephone Encounter (Signed)
Note done/thanks!

## 2019-01-05 ENCOUNTER — Telehealth: Payer: Self-pay | Admitting: *Deleted

## 2019-01-05 NOTE — Telephone Encounter (Signed)
LVMOM TO CALL BACK ABOUT YOUR APPT

## 2019-01-06 NOTE — Telephone Encounter (Signed)
LVMOM FOR PT TO CALL BACK

## 2019-01-06 NOTE — Telephone Encounter (Signed)
Follow up  ° ° °Pt is returning call  ° ° °Please call back  °

## 2019-01-07 ENCOUNTER — Telehealth: Payer: Self-pay | Admitting: *Deleted

## 2019-01-07 ENCOUNTER — Ambulatory Visit (HOSPITAL_COMMUNITY): Payer: Medicaid Other | Attending: Cardiology

## 2019-01-07 NOTE — Telephone Encounter (Signed)
SPOKE WITH AND PT WOULD LIKE TO BE RESCHEDULED WHEN CLINIIC IS HAVING IN FACE APPTS  PT EXPLAINED HELL BE CONTACTED BACK FOR THOSE  APPTSS

## 2019-01-09 ENCOUNTER — Ambulatory Visit: Payer: Self-pay | Admitting: Physician Assistant

## 2019-01-13 ENCOUNTER — Telehealth (HOSPITAL_COMMUNITY): Payer: Self-pay | Admitting: *Deleted

## 2019-01-13 NOTE — Telephone Encounter (Signed)
  Left detailed message.  Arrive no more than 15 mins early (3:30 for 3:45 apt.), wear mask, no visitors.  Please call to  reschedule if any symptoms of virus (fever, fatigue, cough, travel, or contact).  Also informed that pt. Would need to do screening upon arrival.    [facility]

## 2019-01-14 ENCOUNTER — Telehealth (HOSPITAL_COMMUNITY): Payer: Self-pay | Admitting: Radiology

## 2019-01-14 NOTE — Telephone Encounter (Signed)

## 2019-01-14 NOTE — Telephone Encounter (Signed)
No answer unable COVID pre-screening.

## 2019-01-15 ENCOUNTER — Ambulatory Visit (HOSPITAL_COMMUNITY): Payer: Medicaid Other | Attending: Cardiovascular Disease

## 2019-01-15 ENCOUNTER — Other Ambulatory Visit: Payer: Self-pay

## 2019-01-15 DIAGNOSIS — I5022 Chronic systolic (congestive) heart failure: Secondary | ICD-10-CM

## 2019-01-27 ENCOUNTER — Telehealth: Payer: Self-pay | Admitting: Cardiovascular Disease

## 2019-01-27 NOTE — Telephone Encounter (Signed)
Reviewed echo results with patient. He is due for 3 mo visit. Scheduled him for e-visit this Friday with Dr. Excell Seltzer. Consent obtained for video call and he understands to have VS and meds ready for "rooming." He was grateful for assistance.      Virtual Visit Pre-Appointment Phone Call Confirm consent - "In the setting of the current Covid19 crisis, you are scheduled for a (phone or video) visit with your provider on (date) at (time).  Just as we do with many in-office visits, in order for you to participate in this visit, we must obtain consent.  If you'd like, I can send this to your mychart (if signed up) or email for you to review.  Otherwise, I can obtain your verbal consent now.  All virtual visits are billed to your insurance company just like a normal visit would be.  By agreeing to a virtual visit, we'd like you to understand that the technology does not allow for your provider to perform an examination, and thus may limit your provider's ability to fully assess your condition. If your provider identifies any concerns that need to be evaluated in person, we will make arrangements to do so.  Finally, though the technology is pretty good, we cannot assure that it will always work on either your or our end, and in the setting of a video visit, we may have to convert it to a phone-only visit.  In either situation, we cannot ensure that we have a secure connection.  Are you willing to proceed?" STAFF: Did the patient verbally acknowledge consent to telehealth visit? Document YES/NO here: YES   TELEPHONE CALL NOTE  Allen L Jonna Clark. has been deemed a candidate for a follow-up tele-health visit to limit community exposure during the Covid-19 pandemic. I spoke with the patient via phone to ensure availability of phone/video source, confirm preferred email & phone number, and discuss instructions and expectations.  I reminded Allen L Jonna Clark. to be prepared with any vital sign and/or heart rhythm  information that could potentially be obtained via home monitoring, at the time of his visit. I reminded Allen L Jonna Clark. to expect a phone call prior to his visit.   IF USING DOXIMITY or DOXY.ME - The patient will receive a link just prior to their visit by text.

## 2019-01-27 NOTE — Telephone Encounter (Signed)
New Message ° ° ° °Pt is returning call  ° ° ° °Please call back  °

## 2019-01-30 ENCOUNTER — Telehealth (INDEPENDENT_AMBULATORY_CARE_PROVIDER_SITE_OTHER): Payer: Medicaid Other | Admitting: Cardiovascular Disease

## 2019-01-30 ENCOUNTER — Other Ambulatory Visit: Payer: Self-pay

## 2019-01-30 ENCOUNTER — Encounter: Payer: Self-pay | Admitting: Cardiovascular Disease

## 2019-01-30 VITALS — Ht 72.0 in | Wt 257.4 lb

## 2019-01-30 DIAGNOSIS — I25118 Atherosclerotic heart disease of native coronary artery with other forms of angina pectoris: Secondary | ICD-10-CM

## 2019-01-30 DIAGNOSIS — E782 Mixed hyperlipidemia: Secondary | ICD-10-CM

## 2019-01-30 DIAGNOSIS — I5022 Chronic systolic (congestive) heart failure: Secondary | ICD-10-CM

## 2019-01-30 NOTE — Progress Notes (Signed)
Virtual Visit via Video Note   This visit type was conducted due to national recommendations for restrictions regarding the COVID-19 Pandemic (e.g. social distancing) in an effort to limit this patient's exposure and mitigate transmission in our community.  Due to his co-morbid illnesses, this patient is at least at moderate risk for complications without adequate follow up.  This format is felt to be most appropriate for this patient at this time.  All issues noted in this document were discussed and addressed.  A limited physical exam was performed with this format.  Please refer to the patient's chart for his consent to telehealth for [facility] [place].   Date:  01/30/2019   ID:  [name], DOB 10/04/1970, MRN [id number]  Patient Location: Home Provider Location: Home  PCP:  Mack Hook, MD  Cardiologist:  Sherren Mocha, MD  Electrophysiologist:  None   Evaluation Performed:  Follow-Up Visit  Chief Complaint:  Follow-up CHF  History of Present Illness:    Allen L Dravin Lance. is a 48 y.o. male with a hx of CAD, MI, and multivessel CABG in 2019.  He was noted to have postoperative ventricular tachycardia.  He initially had severe LV systolic dysfunction with a follow-up cardiac MRI demonstrating an LVEF of 34%.  The patient declined EP evaluation for ICD.  He has been compliant with medical therapy.  Fortunately, his recent echocardiogram shows improvement in LV function now with an LVEF of 40 to 45%.  The patient is feeling well.  He is evaluated today with video conferencing technology in light of the current COVID-19 pandemic.  He denies chest pain, chest pressure, shortness of breath, leg swelling, heart palpitations, orthopnea, or PND.  States that he has been eating healthy foods but has not been practicing good portion control.  His weight is increased.  He remains physically active with regular walking and has no exertional symptoms.  The patient does not have  symptoms concerning for COVID-19 infection (fever, chills, cough, or new shortness of breath).    Past Medical History:  Diagnosis Date  . CAD (coronary artery disease) of bypass graft   . DM (diabetes mellitus) ([place])   . HLD (hyperlipidemia)   . Ischemic cardiomyopathy   . Tobacco use   . VT (ventricular tachycardia) ([place])    Past Surgical History:  Procedure Laterality Date  . CORONARY ARTERY BYPASS GRAFT N/A 01/08/2018   Procedure: CORONARY ARTERY BYPASS GRAFTING (CABG) x five, using Left Internal Mammary Artery and Right Leg Greater Saphenous Vein harvested endoscopically;  Surgeon: Melrose Nakayama, MD;  Location: [place];  Service: Open Heart Surgery;  Laterality: N/A;  . LEFT HEART CATH AND CORONARY ANGIOGRAPHY N/A 01/03/2018   Procedure: LEFT HEART CATH AND CORONARY ANGIOGRAPHY;  Surgeon: Jettie Booze, MD;  Location: [place] [facility];  Service: Cardiovascular;  Laterality: N/A;  . TEE WITHOUT CARDIOVERSION N/A 01/08/2018   Procedure: TRANSESOPHAGEAL ECHOCARDIOGRAM (TEE);  Surgeon: Melrose Nakayama, MD;  Location: [place];  Service: Open Heart Surgery;  Laterality: N/A;  . WRIST ARTHROPLASTY Right      Current Meds  Medication Sig  . aspirin EC 81 MG tablet Take 1 tablet (81 mg total) by mouth daily.  Marland Kitchen atorvastatin (LIPITOR) 80 MG tablet Take 1 tablet (80 mg total) by mouth at bedtime.  . blood glucose meter kit and supplies KIT Dispense based on patient and insurance preference. Use up to four times daily as directed. (FOR ICD-9 250.00, 250.01).  . carvedilol (  COREG) 12.5 MG tablet Take 1 tablet (12.5 mg total) by mouth 2 (two) times daily.  . citalopram (CELEXA) 20 MG tablet Take 1 tablet (20 mg total) by mouth daily.  . clopidogrel (PLAVIX) 75 MG tablet Take 1 tablet (75 mg total) by mouth daily.  Marland Kitchen ezetimibe (ZETIA) 10 MG tablet Take 1 tablet (10 mg total) by mouth daily.  Marland Kitchen FLUoxetine (PROZAC) 20 MG capsule Take 20 mg by mouth daily.  Marland Kitchen glipiZIDE (GLUCOTROL) 10  MG tablet Take 1 tablet (10 mg total) by mouth 2 (two) times daily before a meal.  . glucose blood (TRUE METRIX BLOOD GLUCOSE TEST) test strip Check blood glucose twice daily before meals  . Lancets MISC Check blood glucose twice daily before meals  . metFORMIN (GLUCOPHAGE) 1000 MG tablet Take 1 tablet (1,000 mg total) by mouth 2 (two) times daily with a meal.  . sacubitril-valsartan (ENTRESTO) 24-26 MG Take 1 tablet by mouth 2 (two) times daily.  . sildenafil (VIAGRA) 50 MG tablet Take 1 tablet (50 mg total) by mouth daily as needed for erectile dysfunction.     Allergies:   Patient has no known allergies.   Social History   Tobacco Use  . Smoking status: Former Smoker    Types: Cigars, Pipe    Last attempt to quit: 09/04/1987    Years since quitting: 31.4  . Smokeless tobacco: Never Used  Substance Use Topics  . Alcohol use: Not Currently  . Drug use: Never     Family Hx: The patient's family history includes Stroke in his sister. There is no history of CAD.  ROS:   Please see the history of present illness.    All other systems reviewed and are negative.  Prior CV studies:   The following studies were reviewed today:  Echo 01/15/2019: IMPRESSIONS    1. Severe akinesis of the mid-apical septal wall.  2. The left ventricle has mild-moderately reduced systolic function, with an ejection fraction of 40-45%. The cavity size was normal. There is mildly increased left ventricular wall thickness. Left ventricular diastolic Doppler parameters are  indeterminate.  3. The mitral valve is grossly normal.  4. The tricuspid valve is grossly normal.  5. The aortic valve is tricuspid. No stenosis of the aortic valve.  6. Akinesis of the distal septum and apex; overall mild to moderate LV dysfunction.  FINDINGS  Left Ventricle: The left ventricle has mild-moderately reduced systolic function, with an ejection fraction of 40-45%. The cavity size was normal. There is mildly increased  left ventricular wall thickness. Left ventricular diastolic Doppler parameters  are indeterminate. Severe akinesis of the mid-apical septal wall.  Right Ventricle: The right ventricle has normal systolic function. The cavity was normal.  Left Atrium: Left atrial size was normal in size.  Right Atrium: Right atrial size was normal in size. Right atrial pressure is estimated at 3 mmHg.  Interatrial Septum: No atrial level shunt detected by color flow Doppler.  Pericardium: There is no evidence of pericardial effusion.  Mitral Valve: The mitral valve is grossly normal. Mitral valve regurgitation is trivial by color flow Doppler.  Tricuspid Valve: The tricuspid valve is grossly normal. Tricuspid valve regurgitation is trivial by color flow Doppler.  Aortic Valve: The aortic valve is tricuspid Aortic valve regurgitation was not visualized by color flow Doppler. There is No stenosis of the aortic valve.  Pulmonic Valve: The pulmonic valve was not well visualized. Pulmonic valve regurgitation is not visualized by color flow Doppler.  Venous:  The inferior vena cava is normal in size with greater than 50% respiratory variability.  Labs/Other Tests and Data Reviewed:    EKG:  An ECG dated 01/30/2018 was personally reviewed today and demonstrated:  sinus tachycardia 104 bpm, LAD, age-indeterminate anterolateral MI  Recent Labs: 05/15/2018: Creatinine, Ser 0.80 10/13/2018: ALT 37   Recent Lipid Panel Lab Results  Component Value Date/Time   CHOL 145 10/13/2018 01:48 PM   TRIG 69 10/13/2018 01:48 PM   HDL 53 10/13/2018 01:48 PM   CHOLHDL 2.7 10/13/2018 01:48 PM   CHOLHDL 5.6 01/04/2018 04:33 AM   LDLCALC 78 10/13/2018 01:48 PM    Wt Readings from Last 3 Encounters:  01/30/19 257 lb 6.4 oz (116.8 kg)  10/13/18 251 lb (113.9 kg)  10/09/18 257 lb 12.8 oz (116.9 kg)     Objective:    Vital Signs:  Ht 6' (1.829 m)   Wt 257 lb 6.4 oz (116.8 kg)   BMI 34.91 kg/m    VITAL  SIGNS:  reviewed Alert, oriented, in NAD. Breathing comfortably in normal conversation  ASSESSMENT & PLAN:    1. Chronic systolic heart failure: Improved LV function noted.  Discussed ongoing efforts at medical therapy to optimize his heart failure regimen and improve LV function.  Recommended that he increase Entresto to 49/51 mg twice daily.  Continue current dose of carvedilol.  Follow-up with me or Richardson Dopp in 3 months. 2. CAD, native vessel, without angina: Continue high intensity statin drug, aspirin, and clopidogrel.  COVID-19 Education: The signs and symptoms of COVID-19 were discussed with the patient and how to seek care for testing (follow up with PCP or arrange E-visit). The importance of social distancing was discussed today.  Time:   Today, I have spent 18 minutes with the patient with telehealth technology discussing the above problems.     Medication Adjustments/Labs and Tests Ordered: Current medicines are reviewed at length with the patient today.  Concerns regarding medicines are outlined above.   Tests Ordered: No orders of the defined types were placed in this encounter.   Medication Changes: No orders of the defined types were placed in this encounter.   Disposition:  Follow up in 3 month(s)with me or Richardson Dopp.  Signed, Sherren Mocha, MD  01/30/2019 3:44 PM    [facility]

## 2019-01-30 NOTE — Addendum Note (Signed)
Addended by: KEMP, KATHRYN A on: 01/30/2019 06:08 PM   Modules accepted: Orders  

## 2019-01-30 NOTE — Patient Instructions (Addendum)
Medication Instructions:  1) INCREASE ENTRESTO to 49/51 mg twice daily  Follow-Up: Your provider recommends that you schedule a follow-up appointment in: 3 months with Tereso Newcomer, PA.

## 2019-02-03 MED ORDER — SACUBITRIL-VALSARTAN 49-51 MG PO TABS: 1.0000 | ORAL_TABLET | Freq: Two times a day (BID) | ORAL | 3 refills | Status: DC

## 2019-02-03 NOTE — Addendum Note (Signed)
Addended by: Gunnar Fusi A on: 02/03/2019 08:08 AM   Modules accepted: Orders

## 2019-02-04 ENCOUNTER — Telehealth: Payer: Self-pay

## 2019-02-04 NOTE — Telephone Encounter (Signed)
The patient's Sherryll Burger was increased to 49/51mg  BID at e-visit last week.  The patient was getting meds through the patient assistance program with [employer] and with the increased dose he can no longer afford it. He understands the PA nurse will be contacted for assistance and she will call him with an update/more information. He was grateful for call.

## 2019-02-05 ENCOUNTER — Other Ambulatory Visit: Payer: Self-pay

## 2019-02-05 MED ORDER — SACUBITRIL-VALSARTAN 49-51 MG PO TABS: 1.0000 | ORAL_TABLET | Freq: Two times a day (BID) | ORAL | 3 refills | Status: AC

## 2019-02-05 NOTE — Telephone Encounter (Signed)
**Note De-Identified Allen Cox Obfuscation** I called [facility] and was advised by Samara Deist that the pt has never activated the card that they sent him in March and that a dose change would not affect his cards benefit of $10 a month.  I have sent a new Entresto RX to the pts pharmacy with his cards info typed in as quoted by [employer].  I called the pt and made him aware. He read the entire approval letter that [facility] sent to him to me over the phone and stated that he did not read prior to today. A card is included with instructions for him to present to his pharmacy.  I have advised him that I have sent a new RX to his pharmacy with his co-pay card info typed in it that Samara Deist from [facility] provided me with: Co-pay card# [id number] RX PCN: Mayra Neer BIN: [id number] RX GRP: [id number]  [name] to take his card with him to pick up his Sherryll Burger from his pharmacy and I gave him a phone number [phone]) that Samara Deist gave me to provide to the pt if the pharmacy has any problems running his refill.  The pt verbalized understanding to all of the above and thanked me hor my help.

## 2019-04-24 ENCOUNTER — Other Ambulatory Visit: Payer: Self-pay | Admitting: Internal Medicine

## 2019-04-24 ENCOUNTER — Other Ambulatory Visit: Payer: Self-pay | Admitting: Cardiovascular Disease

## 2019-04-24 ENCOUNTER — Other Ambulatory Visit: Payer: Self-pay | Admitting: Physician Assistant

## 2019-05-01 ENCOUNTER — Telehealth: Payer: Self-pay

## 2019-05-01 NOTE — Telephone Encounter (Signed)
I left a message for the patient to return my call about switch in his office visit to a virtual visit on Monday, August 31st with Richardson Dopp, PA-C

## 2019-05-04 ENCOUNTER — Other Ambulatory Visit: Payer: Self-pay

## 2019-05-04 ENCOUNTER — Telehealth (INDEPENDENT_AMBULATORY_CARE_PROVIDER_SITE_OTHER): Payer: Self-pay | Admitting: Physician Assistant

## 2019-05-04 ENCOUNTER — Encounter: Payer: Self-pay | Admitting: Physician Assistant

## 2019-05-04 VITALS — Ht 72.0 in | Wt 255.2 lb

## 2019-05-04 DIAGNOSIS — I2510 Atherosclerotic heart disease of native coronary artery without angina pectoris: Secondary | ICD-10-CM

## 2019-05-04 DIAGNOSIS — I5022 Chronic systolic (congestive) heart failure: Secondary | ICD-10-CM

## 2019-05-04 DIAGNOSIS — I472 Ventricular tachycardia, unspecified: Secondary | ICD-10-CM

## 2019-05-04 DIAGNOSIS — E782 Mixed hyperlipidemia: Secondary | ICD-10-CM

## 2019-05-04 NOTE — Patient Instructions (Addendum)
Medication Instructions:  Your physician recommends that you continue on your current medications as directed. Please refer to the Current Medication list given to you today.  If you need a refill on your cardiac medications before your next appointment, please call your pharmacy.   Lab work: FUTURE: BMET on 05/12/2019  If you have labs (blood work) drawn today and your tests are completely normal, you will receive your results only by: Marland Kitchen MyChart Message (if you have MyChart) OR . A paper copy in the mail If you have any lab test that is abnormal or we need to change your treatment, we will call you to review the results.  Testing/Procedures: None   Follow-Up: At [facility], you and your health needs are our priority.  As part of our continuing mission to provide you with exceptional heart care, we have created designated Provider Care Teams.  These Care Teams include your primary Cardiologist (physician) and Advanced Practice Providers (APPs -  Physician Assistants and Nurse Practitioners) who all work together to provide you with the care you need, when you need it. You will need a follow up appointment in:  3 months.  Please call our office 2 months in advance to schedule this appointment.  You may see Sherren Mocha, MD or one of the following Advanced Practice Providers on your designated Care Team: Richardson Dopp, PA-C [place], [place] . Daune Perch, NP  Any Other Special Instructions Will Be Listed Below (If Applicable).  Check your blood pressure 3 times a week for a couple of weeks and send our office the readings

## 2019-05-04 NOTE — Progress Notes (Signed)
Virtual Visit via Telephone Note   This visit type was conducted due to national recommendations for restrictions regarding the COVID-19 Pandemic (e.g. social distancing) in an effort to limit this patient's exposure and mitigate transmission in our community.  Due to his co-morbid illnesses, this patient is at least at moderate risk for complications without adequate follow up.  This format is felt to be most appropriate for this patient at this time.  The patient did not have access to video technology/had technical difficulties with video requiring transitioning to audio format only (telephone).  All issues noted in this document were discussed and addressed.  No physical exam could be performed with this format.  Please refer to the patient's chart for his  consent to telehealth for [facility].   Date:  05/04/2019   ID:  [name], DOB Jul 02, 1971, MRN [id number]  Patient Location: Home Provider Location: Home  PCP:  Mack Hook, MD  Cardiologist:  Sherren Mocha, MD   Electrophysiologist:  None   Evaluation Performed:  Follow-Up Visit  Chief Complaint: CHF  History of Present Illness:    Allen L Wing Schoch. is a 48 y.o. male with:  Coronary artery disease  S/p NSTEMI >> CABG  Postoperative ventricular tachycardia requiring defib/CPR  Chronic systolic CHF  Ischemic cardiomyopathy  Cardiac MRI 9/19:  EF 34  Patient declined referral to EP for ICD  Echo 01/2019: EF 40-45  Diabetes mellitus  Hyperlipidemia  Allen Cox was last seen by Dr. Burt Knack in May 2020.  His dose of Entresto was increased at that time.  Today, he notes he is doing well.  He has shortness of breath with activities if he is exposed to extreme heat.  Otherwise, he feels well.  He has not had chest discomfort.  He has not had syncope.  He does get dizzy if he is out in the extreme heat.  He has not had orthopnea or significant leg swelling.  The patient does not have symptoms  concerning for COVID-19 infection (fever, chills, cough, or new shortness of breath).    Past Medical History:  Diagnosis Date  . CAD (coronary artery disease) of bypass graft   . DM (diabetes mellitus) ([place])   . HLD (hyperlipidemia)   . Ischemic cardiomyopathy   . Tobacco use   . VT (ventricular tachycardia) ([place])    Past Surgical History:  Procedure Laterality Date  . CORONARY ARTERY BYPASS GRAFT N/A 01/08/2018   Procedure: CORONARY ARTERY BYPASS GRAFTING (CABG) x five, using Left Internal Mammary Artery and Right Leg Greater Saphenous Vein harvested endoscopically;  Surgeon: Melrose Nakayama, MD;  Location: [place];  Service: Open Heart Surgery;  Laterality: N/A;  . LEFT HEART CATH AND CORONARY ANGIOGRAPHY N/A 01/03/2018   Procedure: LEFT HEART CATH AND CORONARY ANGIOGRAPHY;  Surgeon: Jettie Booze, MD;  Location: [place] CV LAB;  Service: Cardiovascular;  Laterality: N/A;  . TEE WITHOUT CARDIOVERSION N/A 01/08/2018   Procedure: TRANSESOPHAGEAL ECHOCARDIOGRAM (TEE);  Surgeon: Melrose Nakayama, MD;  Location: [place];  Service: Open Heart Surgery;  Laterality: N/A;  . WRIST ARTHROPLASTY Right      Current Meds  Medication Sig  . aspirin EC 81 MG tablet Take 1 tablet (81 mg total) by mouth daily.  Marland Kitchen atorvastatin (LIPITOR) 80 MG tablet Take 1 tablet (80 mg total) by mouth at bedtime.  . blood glucose meter kit and supplies KIT Dispense based on patient and insurance preference. Use up to four times daily  as directed. (FOR ICD-9 250.00, 250.01).  . carvedilol (COREG) 12.5 MG tablet Take 1 tablet (12.5 mg total) by mouth 2 (two) times daily.  . citalopram (CELEXA) 20 MG tablet Take 1 tablet (20 mg total) by mouth daily.  . clopidogrel (PLAVIX) 75 MG tablet Take 1 tablet by mouth once daily  . ezetimibe (ZETIA) 10 MG tablet Take 1 tablet by mouth once daily  . FLUoxetine (PROZAC) 20 MG capsule Take 20 mg by mouth daily.  Marland Kitchen glipiZIDE (GLUCOTROL) 10 MG tablet TAKE 1 TABLET BY  MOUTH TWICE DAILY BEFORE  A  MEAL  . glucose blood (TRUE METRIX BLOOD GLUCOSE TEST) test strip Check blood glucose twice daily before meals  . Lancets MISC Check blood glucose twice daily before meals  . metFORMIN (GLUCOPHAGE) 1000 MG tablet Take 1 tablet (1,000 mg total) by mouth 2 (two) times daily with a meal.  . sacubitril-valsartan (ENTRESTO) 49-51 MG Take 1 tablet by mouth 2 (two) times daily.  . sildenafil (VIAGRA) 50 MG tablet Take 1 tablet (50 mg total) by mouth daily as needed for erectile dysfunction.     Allergies:   Patient has no known allergies.   Social History   Tobacco Use  . Smoking status: Former Smoker    Types: Cigars, Pipe    Quit date: 09/04/1987    Years since quitting: 31.6  . Smokeless tobacco: Never Used  Substance Use Topics  . Alcohol use: Not Currently  . Drug use: Never     Family Hx: The patient's family history includes Stroke in his sister. There is no history of CAD.  ROS:   Please see the history of present illness.    All other systems reviewed and are negative.   Prior CV studies:   The following studies were reviewed today:  Echocardiogram 01/15/2019 Mid-apical, septal akinesis, EF 40-45, mild LVH  Cardiac MRI 05/15/2018 IMPRESSION: 1. Mildly dilated left ventricle with normal wall thickness and severely decreased systolic function (LVEF = 34%). There is hypokinesis in the mid anteroseptal, anterior and apical septal, anterior walls and in the true apex.  There is late gadolinium enhancement in the following segments: Mid anterior and anteroseptal walls (25-50%), apical septal and anterior walls (50-75%) and true apex (75-100%).  2. Normal right ventricular size, thickness and systolic function (LVEF = 48%). There are no regional wall motion abnormalities.  3.  Normal left and right atrial size.  4. Normal size of the aortic root, ascending aorta and pulmonary artery.  No improvement of LVEF since the last echocardiogram  on 02/13/2018, an ICD implantation is recommended.   Echocardiogram 02/13/2018 Moderate LVH, EF 30-35, apical hypokinesis, grade 2 diastolic dysfunction  Carotid US 01/04/2018 No significant carotid stenosis  Echocardiogram 01/04/2018  mild LVH, EF 30-35  Cardiac catheterization 01/03/2018 LAD proximal 99; D2 60 LCx proximal 75; OM1 75, OM2 40 RCA proximal 25, mid 60, distal 25; RP AV 100 EF 25-35   Labs/Other Tests and Data Reviewed:    EKG:  No ECG reviewed.  Recent Labs: 05/15/2018: Creatinine, Ser 0.80 10/13/2018: ALT 37   Recent Lipid Panel Lab Results  Component Value Date/Time   CHOL 145 10/13/2018 01:48 PM   TRIG 69 10/13/2018 01:48 PM   HDL 53 10/13/2018 01:48 PM   CHOLHDL 2.7 10/13/2018 01:48 PM   CHOLHDL 5.6 01/04/2018 04:33 AM   LDLCALC 78 10/13/2018 01:48 PM     Wt Readings from Last 3 Encounters:  05/04/19 255 lb 3.2 oz (115.8 kg)  01/30/19 257 lb 6.4 oz (116.8 kg)  10/13/18 251 lb (113.9 kg)     Objective:    Vital Signs:  Ht 6' (1.829 m)   Wt 255 lb 3.2 oz (115.8 kg)   BMI 34.61 kg/m    VITAL SIGNS:  reviewed GEN:  no acute distress RESPIRATORY:  No labored breathing NEURO:  Alert and oriented PSYCH:  Normal mood  ASSESSMENT & PLAN:    1. Chronic systolic heart failure (HCC) Ischemic cardiomyopathy.  He did have postoperative ventricular tachycardia requiring defibrillation and CPR at the time of his bypass in May 2019.  EF was 34% on cardiac MRI in September 2019.  The patient declined referral to EP for ICD implantation.  Luckily, since that time, his EF is improved to 40-45% by most recent echocardiogram in May 2020.  He is currently doing well on medical therapy which includes Entresto and carvedilol.  He is not able to check his blood pressure at home.  I will see if we can provide him with a blood pressure cuff and monitor his blood pressure over the next couple of weeks.  I will also arrange a follow-up BMET.  If his creatinine and potassium  are stable and his pressure will tolerate it, I will place him on spironolactone 12.5 mg daily.  Follow-up with me or Dr. Burt Knack in 3 months.  2. Coronary artery disease involving native coronary artery of native heart without angina pectoris Status post non-ST elevation myocardial infarction May 2019 followed by CABG.  He is doing well without anginal symptoms.  Continue aspirin, statin, beta-blocker.  3. Mixed hyperlipidemia LDL optimal on most recent lab work.  Continue current Rx.    4. Ventricular tachycardia ([place]) As noted, he declined referral to EP in the past for ICD implantation.  Fortunately, his ejection fraction has improved over time and is now 40-45 percent.   Time:   Today, I have spent 11.5 minutes with the patient with telehealth technology discussing the above problems.     Medication Adjustments/Labs and Tests Ordered: Current medicines are reviewed at length with the patient today.  Concerns regarding medicines are outlined above.   Tests Ordered: Orders Placed This Encounter  Procedures  . Basic metabolic panel    Medication Changes: No orders of the defined types were placed in this encounter.   Follow Up:  Virtual Visit or In Person in 3 month(s)  Signed, Richardson Dopp, PA-C  05/04/2019 5:43 PM    [place]

## 2019-05-12 ENCOUNTER — Other Ambulatory Visit: Payer: Self-pay

## 2019-05-12 DIAGNOSIS — I2510 Atherosclerotic heart disease of native coronary artery without angina pectoris: Secondary | ICD-10-CM

## 2019-05-12 DIAGNOSIS — I5022 Chronic systolic (congestive) heart failure: Secondary | ICD-10-CM

## 2019-05-13 LAB — BASIC METABOLIC PANEL
BUN/Creatinine Ratio: 9 (ref 9–20)
BUN: 9 mg/dL (ref 6–24)
CO2: 23 mmol/L (ref 20–29)
Calcium: 9 mg/dL (ref 8.7–10.2)
Chloride: 101 mmol/L (ref 96–106)
Creatinine, Ser: 1.05 mg/dL (ref 0.76–1.27)
GFR calc Af Amer: 96 mL/min/{1.73_m2} (ref >59)
GFR calc non Af Amer: 83 mL/min/{1.73_m2} (ref >59)
Glucose: 197 mg/dL — ABNORMAL HIGH (ref 65–99)
Potassium: 4.8 mmol/L (ref 3.5–5.2)
Sodium: 139 mmol/L (ref 134–144)

## 2019-05-19 ENCOUNTER — Telehealth: Payer: Self-pay | Admitting: Physician Assistant

## 2019-05-19 DIAGNOSIS — I2510 Atherosclerotic heart disease of native coronary artery without angina pectoris: Secondary | ICD-10-CM

## 2019-05-19 DIAGNOSIS — I5022 Chronic systolic (congestive) heart failure: Secondary | ICD-10-CM

## 2019-05-19 DIAGNOSIS — I255 Ischemic cardiomyopathy: Secondary | ICD-10-CM

## 2019-05-19 MED ORDER — SPIRONOLACTONE 25 MG PO TABS: 12.5000 mg | ORAL_TABLET | Freq: Every day | ORAL | 6 refills | Status: DC

## 2019-05-19 NOTE — Telephone Encounter (Signed)
I spoke with pt and gave him information from [place], [place]. Will send prescription to [facility] on [address].  Pt will come to office for BMP on 05/27/19 at 1:30 and 06/03/19 at 1:15.

## 2019-05-19 NOTE — Telephone Encounter (Signed)
New Message ° ° ° °Patient is returning call in reference to lab results. Please call.  °

## 2019-05-19 NOTE — Telephone Encounter (Signed)
His BP should tolerate adding Spironolactone to his medical regimen. This is good to add to his medications for heart failure and prior myocardial infarction.  PLAN:  1. Start Spironolactone 12.5 mg once daily  2. Obtain BMET 1 week and 2 weeks after 1st dose. Richardson Dopp, PA-C    05/19/2019 4:29 PM

## 2019-05-19 NOTE — Telephone Encounter (Signed)
Notes recorded by Liliane Shi, PA-C on 05/13/2019 at 5:26 PM EDT  Creatinine, K+ normal.  PLAN:  -Please ask patient if he has been able to check his BP. If so, what has his BP been running?  Richardson Dopp, PA-C   05/13/2019 5:25 PM  I spoke with pt and reviewed lab results with him. He reports the following recent BP readings- 110/76,118/62,111/60

## 2019-05-27 ENCOUNTER — Other Ambulatory Visit: Payer: Self-pay | Admitting: *Deleted

## 2019-05-27 ENCOUNTER — Other Ambulatory Visit: Payer: Self-pay

## 2019-05-27 DIAGNOSIS — I5022 Chronic systolic (congestive) heart failure: Secondary | ICD-10-CM

## 2019-05-27 DIAGNOSIS — I2510 Atherosclerotic heart disease of native coronary artery without angina pectoris: Secondary | ICD-10-CM

## 2019-05-27 DIAGNOSIS — I255 Ischemic cardiomyopathy: Secondary | ICD-10-CM

## 2019-05-28 LAB — BASIC METABOLIC PANEL
BUN/Creatinine Ratio: 9 (ref 9–20)
BUN: 9 mg/dL (ref 6–24)
CO2: 25 mmol/L (ref 20–29)
Calcium: 9.5 mg/dL (ref 8.7–10.2)
Chloride: 100 mmol/L (ref 96–106)
Creatinine, Ser: 1.01 mg/dL (ref 0.76–1.27)
GFR calc Af Amer: 100 mL/min/{1.73_m2} (ref >59)
GFR calc non Af Amer: 87 mL/min/{1.73_m2} (ref >59)
Glucose: 208 mg/dL — ABNORMAL HIGH (ref 65–99)
Potassium: 4.6 mmol/L (ref 3.5–5.2)
Sodium: 138 mmol/L (ref 134–144)

## 2019-06-03 ENCOUNTER — Encounter (INDEPENDENT_AMBULATORY_CARE_PROVIDER_SITE_OTHER): Payer: Self-pay

## 2019-06-03 ENCOUNTER — Other Ambulatory Visit: Payer: Self-pay | Admitting: *Deleted

## 2019-06-03 ENCOUNTER — Other Ambulatory Visit: Payer: Self-pay

## 2019-06-03 DIAGNOSIS — I255 Ischemic cardiomyopathy: Secondary | ICD-10-CM

## 2019-06-03 DIAGNOSIS — I2510 Atherosclerotic heart disease of native coronary artery without angina pectoris: Secondary | ICD-10-CM

## 2019-06-03 DIAGNOSIS — I5022 Chronic systolic (congestive) heart failure: Secondary | ICD-10-CM

## 2019-06-04 LAB — BASIC METABOLIC PANEL
BUN/Creatinine Ratio: 9 (ref 9–20)
BUN: 8 mg/dL (ref 6–24)
CO2: 23 mmol/L (ref 20–29)
Calcium: 9.1 mg/dL (ref 8.7–10.2)
Chloride: 97 mmol/L (ref 96–106)
Creatinine, Ser: 0.94 mg/dL (ref 0.76–1.27)
GFR calc Af Amer: 110 mL/min/{1.73_m2} (ref >59)
GFR calc non Af Amer: 95 mL/min/{1.73_m2} (ref >59)
Glucose: 230 mg/dL — ABNORMAL HIGH (ref 65–99)
Potassium: 4.5 mmol/L (ref 3.5–5.2)
Sodium: 135 mmol/L (ref 134–144)

## 2019-06-27 ENCOUNTER — Other Ambulatory Visit: Payer: Self-pay | Admitting: Internal Medicine

## 2019-07-27 ENCOUNTER — Other Ambulatory Visit: Payer: Self-pay | Admitting: Internal Medicine

## 2019-07-27 NOTE — Telephone Encounter (Signed)
To Dr. Mulberry for approval 

## 2019-08-04 NOTE — Telephone Encounter (Signed)
Okay to fill until February--he needs appt for another refill of this.

## 2019-08-04 NOTE — Telephone Encounter (Signed)
To Antony Madura to call patient and schedule. Appointment before feb. Patient will not get any further refills until seen or does virtual visit.

## 2019-08-20 ENCOUNTER — Other Ambulatory Visit: Payer: Self-pay | Admitting: Internal Medicine

## 2019-08-23 ENCOUNTER — Other Ambulatory Visit: Payer: Self-pay | Admitting: Internal Medicine
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
## 2019-12-02 IMAGING — DX DG CHEST 1V PORT
1 series · 1 of 1 positions shown · non-contrast
Comparison: Radiograph January 08, 2018.

CLINICAL DATA: Status post coronary artery bypass graft.

EXAM:
PORTABLE CHEST 1 VIEW

[chest]
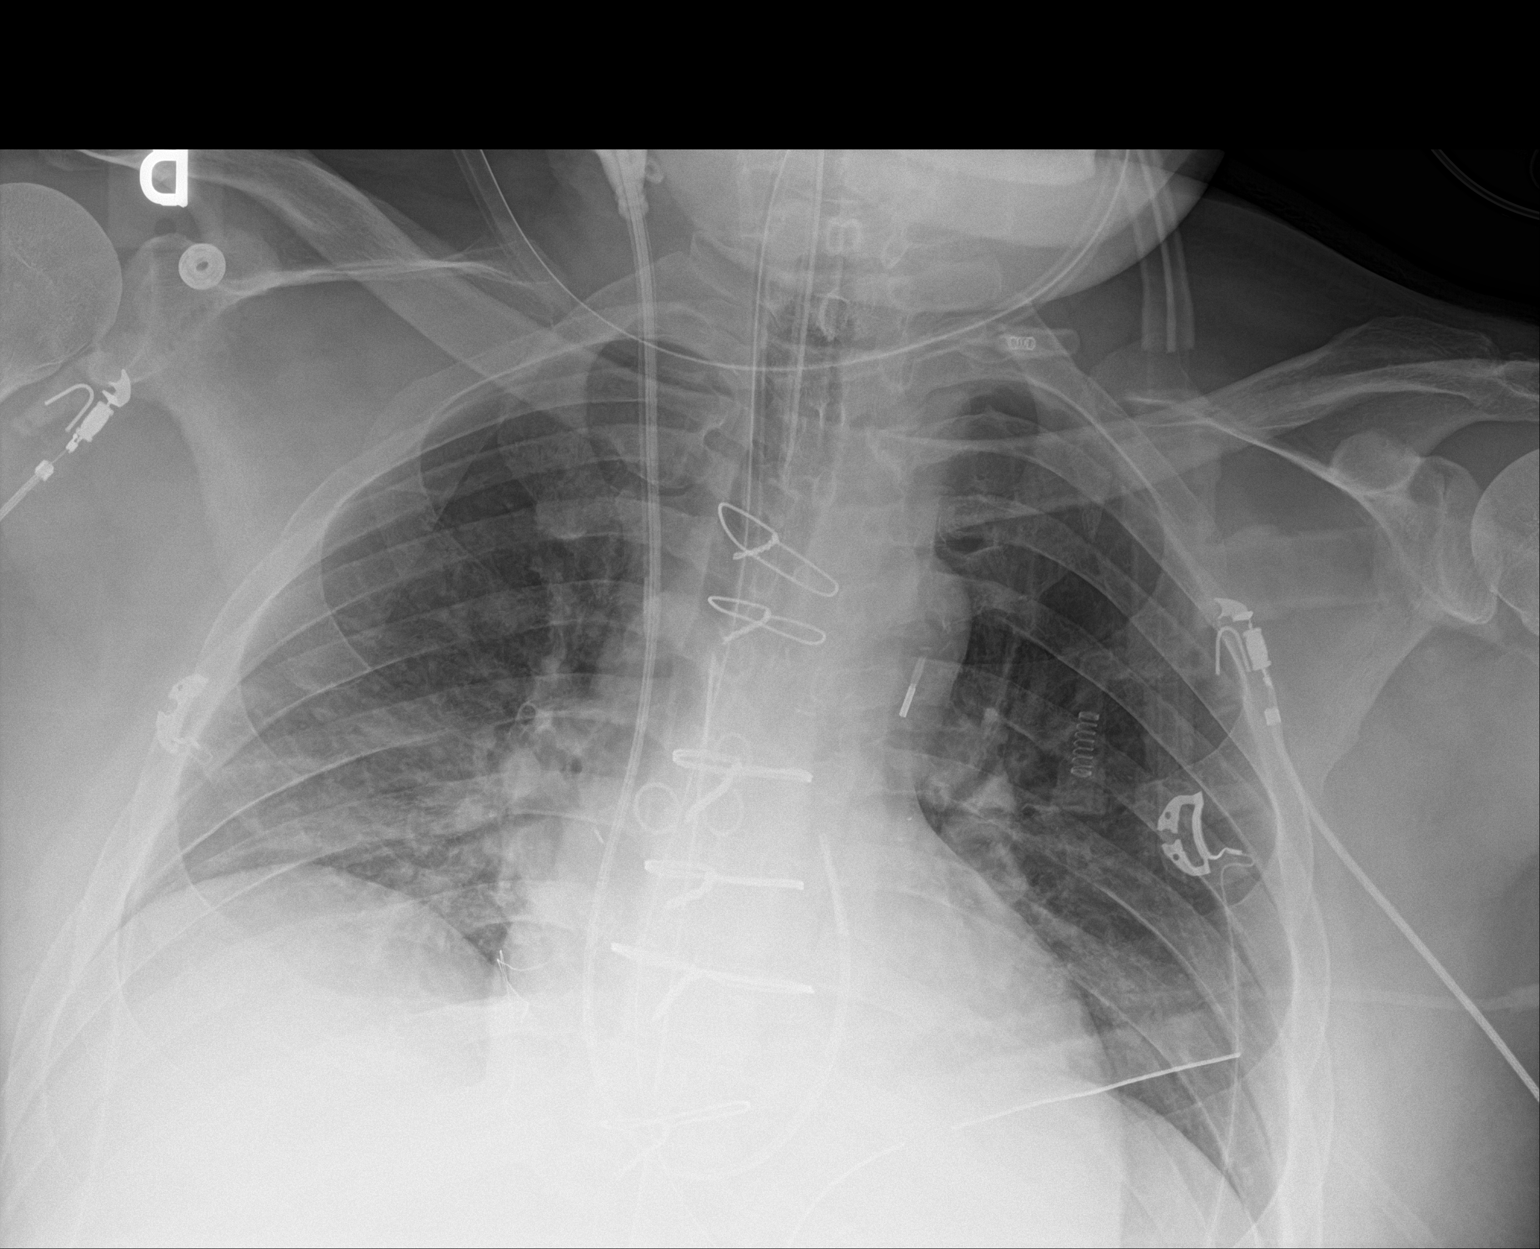

[1 of 1 positions shown; findings below may reference images not displayed]

FINDINGS: Stable cardiomediastinal silhouette. Endotracheal and nasogastric
tubes are unchanged in position. Right internal jugular Swan-Ganz
catheter is unchanged with tip in expected position of main
pulmonary artery. Left-sided chest tube is noted without
pneumothorax. Distal tip of aortic balloon is seen near aortic knob
and unchanged in position. Mild right basilar subsegmental
atelectasis is noted. Bony thorax is unremarkable.
IMPRESSION: Stable support apparatus. Stable position of left-sided chest tube
without pneumothorax. Mild right basilar subsegmental atelectasis.

## 2020-04-13 ENCOUNTER — Other Ambulatory Visit: Payer: Self-pay | Admitting: Cardiovascular Disease

## 2020-04-21 ENCOUNTER — Other Ambulatory Visit: Payer: Self-pay | Admitting: Cardiovascular Disease

## 2020-10-05 ENCOUNTER — Emergency Department (HOSPITAL_COMMUNITY): Payer: No Typology Code available for payment source

## 2020-10-05 ENCOUNTER — Emergency Department (HOSPITAL_COMMUNITY)
Admission: EM | Admit: 2020-10-05 | Discharge: 2020-10-06 | Disposition: A | Discharge status: Home / Self Care | Payer: No Typology Code available for payment source | Attending: Emergency Medicine | Admitting: Emergency Medicine

## 2020-10-05 ENCOUNTER — Other Ambulatory Visit: Payer: Self-pay

## 2020-10-05 DIAGNOSIS — M25552 Pain in left hip: Secondary | ICD-10-CM | POA: Insufficient documentation

## 2020-10-05 DIAGNOSIS — Y92410 Unspecified street and highway as the place of occurrence of the external cause: Secondary | ICD-10-CM | POA: Diagnosis not present

## 2020-10-05 DIAGNOSIS — Z87891 Personal history of nicotine dependence: Secondary | ICD-10-CM | POA: Diagnosis not present

## 2020-10-05 DIAGNOSIS — Z7902 Long term (current) use of antithrombotics/antiplatelets: Secondary | ICD-10-CM | POA: Insufficient documentation

## 2020-10-05 DIAGNOSIS — Z7984 Long term (current) use of oral hypoglycemic drugs: Secondary | ICD-10-CM | POA: Insufficient documentation

## 2020-10-05 DIAGNOSIS — E119 Type 2 diabetes mellitus without complications: Secondary | ICD-10-CM | POA: Insufficient documentation

## 2020-10-05 DIAGNOSIS — Z951 Presence of aortocoronary bypass graft: Secondary | ICD-10-CM | POA: Insufficient documentation

## 2020-10-05 DIAGNOSIS — Z79899 Other long term (current) drug therapy: Secondary | ICD-10-CM | POA: Insufficient documentation

## 2020-10-05 DIAGNOSIS — M545 Low back pain, unspecified: Secondary | ICD-10-CM | POA: Insufficient documentation

## 2020-10-05 DIAGNOSIS — I2510 Atherosclerotic heart disease of native coronary artery without angina pectoris: Secondary | ICD-10-CM | POA: Diagnosis not present

## 2020-10-05 DIAGNOSIS — S20211A Contusion of right front wall of thorax, initial encounter: Secondary | ICD-10-CM | POA: Insufficient documentation

## 2020-10-05 DIAGNOSIS — Z7982 Long term (current) use of aspirin: Secondary | ICD-10-CM | POA: Insufficient documentation

## 2020-10-05 DIAGNOSIS — R52 Pain, unspecified: Secondary | ICD-10-CM

## 2020-10-05 DIAGNOSIS — R109 Unspecified abdominal pain: Secondary | ICD-10-CM | POA: Diagnosis present

## 2020-10-05 LAB — CBC
HCT: 41.3 % (ref 39.0–52.0)
Hemoglobin: 12.8 g/dL — ABNORMAL LOW (ref 13.0–17.0)
MCH: 25 pg — ABNORMAL LOW (ref 26.0–34.0)
MCHC: 31 g/dL (ref 30.0–36.0)
MCV: 80.8 fL (ref 80.0–100.0)
Platelets: 206 10*3/uL (ref 150–400)
RBC: 5.11 MIL/uL (ref 4.22–5.81)
RDW: 13.3 % (ref 11.5–15.5)
WBC: 3.5 10*3/uL — ABNORMAL LOW (ref 4.0–10.5)
nRBC: 0 % (ref 0.0–0.2)

## 2020-10-05 LAB — I-STAT CHEM 8, ED
BUN: 11 mg/dL (ref 6–20)
Calcium, Ion: 1.17 mmol/L (ref 1.15–1.40)
Chloride: 101 mmol/L (ref 98–111)
Creatinine, Ser: 0.7 mg/dL (ref 0.61–1.24)
Glucose, Bld: 304 mg/dL — ABNORMAL HIGH (ref 70–99)
HCT: 41 % (ref 39.0–52.0)
Hemoglobin: 13.9 g/dL (ref 13.0–17.0)
Potassium: 4.4 mmol/L (ref 3.5–5.1)
Sodium: 137 mmol/L (ref 135–145)
TCO2: 26 mmol/L (ref 22–32)

## 2020-10-05 LAB — LACTIC ACID, PLASMA: Lactic Acid, Venous: 2.1 mmol/L (ref 0.5–1.9)

## 2020-10-05 LAB — COMPREHENSIVE METABOLIC PANEL
ALT: 28 U/L (ref 0–44)
AST: 20 U/L (ref 15–41)
Albumin: 3.4 g/dL — ABNORMAL LOW (ref 3.5–5.0)
Alkaline Phosphatase: 82 U/L (ref 38–126)
Anion gap: 11 (ref 5–15)
BUN: 9 mg/dL (ref 6–20)
CO2: 23 mmol/L (ref 22–32)
Calcium: 8.8 mg/dL — ABNORMAL LOW (ref 8.9–10.3)
Chloride: 102 mmol/L (ref 98–111)
Creatinine, Ser: 0.88 mg/dL (ref 0.61–1.24)
GFR, Estimated: >60 mL/min (ref >60)
Glucose, Bld: 305 mg/dL — ABNORMAL HIGH (ref 70–99)
Potassium: 4.1 mmol/L (ref 3.5–5.1)
Sodium: 136 mmol/L (ref 135–145)
Total Bilirubin: 0.7 mg/dL (ref 0.3–1.2)
Total Protein: 6.5 g/dL (ref 6.5–8.1)

## 2020-10-05 LAB — SAMPLE TO BLOOD BANK

## 2020-10-05 LAB — PROTIME-INR
INR: 1.1 (ref 0.8–1.2)
Prothrombin Time: 13.5 seconds (ref 11.4–15.2)

## 2020-10-05 LAB — ETHANOL: Alcohol, Ethyl (B): <10 mg/dL (ref <10)

## 2020-10-05 MED ORDER — HYDROMORPHONE HCL 1 MG/ML IJ SOLN
0.5000 mg | Freq: Once | INTRAMUSCULAR | Status: AC
  Administered 2020-10-06: 0.5 mg via INTRAVENOUS
  Filled 2020-10-05: qty 1

## 2020-10-05 MED ORDER — IOHEXOL 300 MG/ML  SOLN
100.0000 mL | Freq: Once | INTRAMUSCULAR | Status: AC | PRN
  Administered 2020-10-05: 100 mL via INTRAVENOUS

## 2020-10-05 MED ORDER — SODIUM CHLORIDE 0.9 % IV BOLUS
500.0000 mL | Freq: Once | INTRAVENOUS | Status: AC
  Administered 2020-10-06: 500 mL via INTRAVENOUS

## 2020-10-05 NOTE — ED Notes (Signed)
[place] MD notified of Lactic Acid critical value.
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
## 2020-10-05 NOTE — ED Notes (Signed)
Patient transported to X-ray 
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
## 2020-10-06 MED ORDER — LIDOCAINE 5 % EX PTCH: 1.0000 | MEDICATED_PATCH | CUTANEOUS | 0 refills | Status: DC

## 2020-10-06 MED ORDER — CYCLOBENZAPRINE HCL 10 MG PO TABS: 10.0000 mg | ORAL_TABLET | Freq: Two times a day (BID) | ORAL | 0 refills | Status: DC | PRN
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
## 2020-10-11 ENCOUNTER — Encounter (HOSPITAL_COMMUNITY): Payer: Self-pay | Admitting: Emergency Medicine

## 2020-10-11 ENCOUNTER — Ambulatory Visit (HOSPITAL_COMMUNITY)
Admission: EM | Admit: 2020-10-11 | Discharge: 2020-10-11 | Disposition: A | Discharge status: Home / Self Care | Payer: Self-pay

## 2020-10-11 ENCOUNTER — Emergency Department (HOSPITAL_COMMUNITY): Payer: Medicaid Other

## 2020-10-11 ENCOUNTER — Other Ambulatory Visit: Payer: Self-pay

## 2020-10-11 ENCOUNTER — Emergency Department (HOSPITAL_COMMUNITY)
Admission: EM | Admit: 2020-10-11 | Discharge: 2020-10-11 | Disposition: A | Discharge status: Home / Self Care | Payer: Medicaid Other | Attending: Emergency Medicine | Admitting: Emergency Medicine

## 2020-10-11 ENCOUNTER — Encounter (HOSPITAL_COMMUNITY): Payer: Self-pay | Admitting: *Deleted

## 2020-10-11 DIAGNOSIS — E119 Type 2 diabetes mellitus without complications: Secondary | ICD-10-CM | POA: Insufficient documentation

## 2020-10-11 DIAGNOSIS — Z951 Presence of aortocoronary bypass graft: Secondary | ICD-10-CM | POA: Insufficient documentation

## 2020-10-11 DIAGNOSIS — R0789 Other chest pain: Secondary | ICD-10-CM | POA: Insufficient documentation

## 2020-10-11 DIAGNOSIS — S46912A Strain of unspecified muscle, fascia and tendon at shoulder and upper arm level, left arm, initial encounter: Secondary | ICD-10-CM

## 2020-10-11 DIAGNOSIS — R413 Other amnesia: Secondary | ICD-10-CM

## 2020-10-11 DIAGNOSIS — S060X0D Concussion without loss of consciousness, subsequent encounter: Secondary | ICD-10-CM | POA: Insufficient documentation

## 2020-10-11 DIAGNOSIS — R1011 Right upper quadrant pain: Secondary | ICD-10-CM

## 2020-10-11 DIAGNOSIS — Z87891 Personal history of nicotine dependence: Secondary | ICD-10-CM | POA: Insufficient documentation

## 2020-10-11 DIAGNOSIS — M546 Pain in thoracic spine: Secondary | ICD-10-CM | POA: Insufficient documentation

## 2020-10-11 DIAGNOSIS — R42 Dizziness and giddiness: Secondary | ICD-10-CM

## 2020-10-11 DIAGNOSIS — Z7982 Long term (current) use of aspirin: Secondary | ICD-10-CM | POA: Insufficient documentation

## 2020-10-11 DIAGNOSIS — I2510 Atherosclerotic heart disease of native coronary artery without angina pectoris: Secondary | ICD-10-CM | POA: Insufficient documentation

## 2020-10-11 DIAGNOSIS — S060X0S Concussion without loss of consciousness, sequela: Secondary | ICD-10-CM

## 2020-10-11 DIAGNOSIS — S20211D Contusion of right front wall of thorax, subsequent encounter: Secondary | ICD-10-CM

## 2020-10-11 DIAGNOSIS — Z7984 Long term (current) use of oral hypoglycemic drugs: Secondary | ICD-10-CM | POA: Insufficient documentation

## 2020-10-11 LAB — URINALYSIS, ROUTINE W REFLEX MICROSCOPIC
Bacteria, UA: NONE SEEN
Bilirubin Urine: NEGATIVE
Glucose, UA: >=500 mg/dL — AB
Hgb urine dipstick: NEGATIVE
Ketones, ur: NEGATIVE mg/dL
Leukocytes,Ua: NEGATIVE
Nitrite: NEGATIVE
Protein, ur: NEGATIVE mg/dL
Specific Gravity, Urine: 1.023 (ref 1.005–1.030)
pH: 6 (ref 5.0–8.0)

## 2020-10-11 LAB — COMPREHENSIVE METABOLIC PANEL
ALT: 25 U/L (ref 0–44)
AST: 21 U/L (ref 15–41)
Albumin: 3.8 g/dL (ref 3.5–5.0)
Alkaline Phosphatase: 112 U/L (ref 38–126)
Anion gap: 9 (ref 5–15)
BUN: 10 mg/dL (ref 6–20)
CO2: 25 mmol/L (ref 22–32)
Calcium: 9.1 mg/dL (ref 8.9–10.3)
Chloride: 101 mmol/L (ref 98–111)
Creatinine, Ser: 0.9 mg/dL (ref 0.61–1.24)
GFR, Estimated: >60 mL/min (ref >60)
Glucose, Bld: 430 mg/dL — ABNORMAL HIGH (ref 70–99)
Potassium: 4.6 mmol/L (ref 3.5–5.1)
Sodium: 135 mmol/L (ref 135–145)
Total Bilirubin: 0.4 mg/dL (ref 0.3–1.2)
Total Protein: 6.8 g/dL (ref 6.5–8.1)

## 2020-10-11 LAB — CBC
HCT: 42.5 % (ref 39.0–52.0)
Hemoglobin: 12.9 g/dL — ABNORMAL LOW (ref 13.0–17.0)
MCH: 24.9 pg — ABNORMAL LOW (ref 26.0–34.0)
MCHC: 30.4 g/dL (ref 30.0–36.0)
MCV: 81.9 fL (ref 80.0–100.0)
Platelets: 231 10*3/uL (ref 150–400)
RBC: 5.19 MIL/uL (ref 4.22–5.81)
RDW: 13.5 % (ref 11.5–15.5)
WBC: 4.8 10*3/uL (ref 4.0–10.5)
nRBC: 0 % (ref 0.0–0.2)

## 2020-10-11 LAB — LIPASE, BLOOD: Lipase: 27 U/L (ref 11–51)

## 2020-10-11 MED ORDER — ORPHENADRINE CITRATE ER 100 MG PO TB12: 100.0000 mg | ORAL_TABLET | Freq: Two times a day (BID) | ORAL | 0 refills | Status: DC

## 2020-10-11 MED ORDER — HYDROCODONE-ACETAMINOPHEN 5-325 MG PO TABS: 1.0000 | ORAL_TABLET | Freq: Four times a day (QID) | ORAL | 0 refills | Status: DC | PRN

## 2020-10-11 NOTE — ED Notes (Signed)
Pt denies abdominal pain at this time.

## 2020-10-11 NOTE — ED Notes (Addendum)
Patient is being discharged from the Urgent Care and sent to the Emergency Department via POV . Per L. Cheree Ditto, [place] patient is in need of higher level of care due to memory loss, dizziness, abdominal pain. Patient is aware and verbalizes understanding of plan of care. Pt walked out from [facility] w/o indication of acute pain, ambulated with full balance. Patient was able to fully re-dress himself, ask questions and take notes of interactions.  Vitals:   10/11/20 1649  BP: 126/78  Pulse: (!) 103  Resp: 20  Temp: 98.4 F (36.9 C)  SpO2: 98%

## 2020-10-11 NOTE — Discharge Instructions (Addendum)
1.  Follow-up with [facility] for further evaluation of suspected concussion. 2.  You may take 1-2 Vicodin tablets every 6 hours if needed for upper back pain. 3.  Return to the emergency department for develop fever, problems with your vision, shortness of breath or other concerning symptoms.

## 2020-10-11 NOTE — Discharge Instructions (Addendum)
Please head straight to [facility] ED for further evaluation of memory loss, headaches, dizziness, abdominal pain.
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
## 2020-10-11 NOTE — ED Provider Notes (Signed)
Analgesics prescription provided per attending Dr. Donnald Garre due to technical difficulties with electronic prescribing.   [facility] Controlled Substance reporting System queried    Desmond Lope 10/11/20 2325    Arby Barrette, MD 10/24/20 [phone]
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
## 2020-10-17 ENCOUNTER — Telehealth: Payer: Self-pay | Admitting: *Deleted

## 2020-10-17 ENCOUNTER — Ambulatory Visit: Payer: Medicaid Other | Admitting: Diagnostic Neuroimaging

## 2020-10-17 ENCOUNTER — Encounter: Payer: Self-pay | Admitting: Diagnostic Neuroimaging

## 2020-10-17 NOTE — Telephone Encounter (Signed)
Patient was no show for new patient appointment today. 

## 2020-11-10 ENCOUNTER — Telehealth: Payer: Self-pay | Admitting: Internal Medicine
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
## 2020-11-14 NOTE — Telephone Encounter (Signed)
Have not seen him in 2 years--he stopped following up.   He would have to reestablish, but am happy to see him.

## 2020-11-16 NOTE — Telephone Encounter (Signed)
Patient wish to reestablish care and his appointment was schedule for 04/20/2021. And was placed on the waiting list in case of cancellation.

## 2021-04-20 ENCOUNTER — Encounter: Payer: Self-pay | Admitting: Internal Medicine

## 2021-04-20 ENCOUNTER — Other Ambulatory Visit: Payer: Self-pay

## 2021-04-20 ENCOUNTER — Ambulatory Visit: Payer: Self-pay | Admitting: Internal Medicine

## 2021-04-20 VITALS — BP 126/110 | HR 88 | Resp 12 | Ht 71.0 in | Wt 243.0 lb

## 2021-04-20 DIAGNOSIS — I255 Ischemic cardiomyopathy: Secondary | ICD-10-CM | POA: Diagnosis not present

## 2021-04-20 DIAGNOSIS — E782 Mixed hyperlipidemia: Secondary | ICD-10-CM

## 2021-04-20 DIAGNOSIS — R5383 Other fatigue: Secondary | ICD-10-CM | POA: Diagnosis not present

## 2021-04-20 DIAGNOSIS — E119 Type 2 diabetes mellitus without complications: Secondary | ICD-10-CM | POA: Diagnosis not present

## 2021-04-20 DIAGNOSIS — F329 Major depressive disorder, single episode, unspecified: Secondary | ICD-10-CM

## 2021-04-20 NOTE — Patient Instructions (Addendum)
[facility]  Directions  Website Address: [address] [place], [place] [address]  Phone: [phone]   When you get home--call clinic immediately with all the bottles of medication you are using currently and the ones that you have some left but are not taking to [facility].

## 2021-04-20 NOTE — Progress Notes (Signed)
    Subjective:    Patient ID: Allen Cox., male   DOB: June 19, 1971, 50 y.o.   MRN: [id number]   HPI  Here to reestablish  Lost to follow up back in spring of 2020 at beginning of pandemic.  Describes fear of getting out in public.   He has been isolated in his home.  Called [facility] [place] and they do not have any other medications listed for him save for Meloxicam and Gabapentin, both last filled in June.   We have not filled medications since December of 2020 and he has not been elsewhere. Has not been able to afford meds, part of the reason he is not filling.  Dealing with isolation, heat intolerance (so does not go outside or for walks--feels physically unable to move in heat)  Hot water in shower makes him feel similarly.  Strength is poor.  Feels depressed/anxious, paranoid.  Nightmares.  Random episodes of crying--uncontrollable.  Generalized discomfort--right shoulder blade.  Generalized soreness.  Light headed at times.    Appears to be not taking medications for the following chronic health issues:    DM:  is checking sugars.  Running in 250 + range.  2.   Hyperlipidemia:  was taking Atorvastatin and Zetia.  May still have meds from 2 years ago he is utilizing intermittently.  3.  Ischemic Cardiomyopathy with hx of MI, S/P CABG x 5.  Asking about Entresto--not clear if he is still using here and there.   Was also taking Carvedilol 12.5 mg twice daily.  4.  Depression:  was taking Citalopram 20 mg daily.  Current Meds  Medication Sig   aspirin EC 81 MG tablet Take 1 tablet (81 mg total) by mouth daily.   blood glucose meter kit and supplies KIT Dispense based on patient and insurance preference. Use up to four times daily as directed. (FOR ICD-9 250.00, 250.01).   glucose blood (TRUE METRIX BLOOD GLUCOSE TEST) test strip Check blood glucose twice daily before meals   Lancets MISC Check blood glucose twice daily before meals   No Known  Allergies   Review of Systems    Objective:   BP (!) 126/110 (BP Location: Left Arm, Patient Position: Sitting, Cuff Size: Normal)   Pulse 88   Resp 12   Ht 5' 11" (1.803 m)   Wt 243 lb (110.2 kg)   BMI 33.89 kg/m   Physical Exam NAD HEENT:  PERRL, EOMI, throat without injection Neck:  supple, No adenopathy, no thyromegaly Chest:  CTA CV:  RRR with normal S1 and S2, No S3, S4 or murmur.  No carotid bruits.  Carotid, radial and DP pulses normal and equal.   Abd:  S, NT, No HSM or mass, + BS   Assessment & Plan   Hypertension/ischemic cardiomyopathy:  Restart Carvedilol.  Will need to check on meds he still has at home.  Will restart Entresto as well if still has some available until can get back to cardiology for samples.  Otherwise, this is too expensive.  Restart Clopidogrel.  2.  DM:  restart Metformin and glipizide, but check kidney and liver function.  A1C  3.  Hyperlipidemia:  restart statin and Zetia.  FLP  4.  Fatigue:  likely due to not taking meds and depression:  labs as above plus CBC, TSH  5.  Depression:  info given to go to [place] for counseling and start Citalopram

## 2021-04-21 LAB — LIPID PANEL W/O CHOL/HDL RATIO
Cholesterol, Total: 256 mg/dL — ABNORMAL HIGH (ref 100–199)
HDL: 49 mg/dL (ref >39)
LDL Chol Calc (NIH): 190 mg/dL — ABNORMAL HIGH (ref 0–99)
Triglycerides: 97 mg/dL (ref 0–149)
VLDL Cholesterol Cal: 17 mg/dL (ref 5–40)

## 2021-04-21 LAB — COMPREHENSIVE METABOLIC PANEL
ALT: 37 IU/L (ref 0–44)
AST: 20 IU/L (ref 0–40)
Albumin/Globulin Ratio: 1.8 (ref 1.2–2.2)
Albumin: 4.6 g/dL (ref 3.8–4.9)
Alkaline Phosphatase: 145 IU/L — ABNORMAL HIGH (ref 44–121)
BUN/Creatinine Ratio: 11 (ref 9–20)
BUN: 10 mg/dL (ref 6–24)
Bilirubin Total: 0.3 mg/dL (ref 0.0–1.2)
CO2: 23 mmol/L (ref 20–29)
Calcium: 9.7 mg/dL (ref 8.7–10.2)
Chloride: 98 mmol/L (ref 96–106)
Creatinine, Ser: 0.93 mg/dL (ref 0.76–1.27)
Globulin, Total: 2.6 g/dL (ref 1.5–4.5)
Glucose: 359 mg/dL — ABNORMAL HIGH (ref 65–99)
Potassium: 5 mmol/L (ref 3.5–5.2)
Sodium: 135 mmol/L (ref 134–144)
Total Protein: 7.2 g/dL (ref 6.0–8.5)
eGFR: 99 mL/min/{1.73_m2} (ref >59)

## 2021-04-21 LAB — CBC WITH DIFFERENTIAL/PLATELET
Basophils Absolute: 0 10*3/uL (ref 0.0–0.2)
Basos: 1 %
EOS (ABSOLUTE): 0.1 10*3/uL (ref 0.0–0.4)
Eos: 2 %
Hematocrit: 46 % (ref 37.5–51.0)
Hemoglobin: 14.8 g/dL (ref 13.0–17.7)
Immature Grans (Abs): 0 10*3/uL (ref 0.0–0.1)
Immature Granulocytes: 0 %
Lymphocytes Absolute: 1.4 10*3/uL (ref 0.7–3.1)
Lymphs: 36 %
MCH: 25.6 pg — ABNORMAL LOW (ref 26.6–33.0)
MCHC: 32.2 g/dL (ref 31.5–35.7)
MCV: 80 fL (ref 79–97)
Monocytes Absolute: 0.5 10*3/uL (ref 0.1–0.9)
Monocytes: 13 %
Neutrophils Absolute: 1.8 10*3/uL (ref 1.4–7.0)
Neutrophils: 48 %
Platelets: 238 10*3/uL (ref 150–450)
RBC: 5.78 x10E6/uL (ref 4.14–5.80)
RDW: 13 % (ref 11.6–15.4)
WBC: 3.8 10*3/uL (ref 3.4–10.8)

## 2021-04-21 LAB — TSH: TSH: 1.63 u[IU]/mL (ref 0.450–4.500)

## 2021-04-21 LAB — HGB A1C W/O EAG: Hgb A1c MFr Bld: 15.3 % — ABNORMAL HIGH (ref 4.8–5.6)

## 2021-05-17 MED ORDER — CITALOPRAM HYDROBROMIDE 20 MG PO TABS: 20.0000 mg | ORAL_TABLET | Freq: Every day | ORAL | 11 refills | Status: AC

## 2021-05-17 MED ORDER — EZETIMIBE 10 MG PO TABS: 10.0000 mg | ORAL_TABLET | Freq: Every day | ORAL | 11 refills | Status: DC

## 2021-05-17 MED ORDER — CLOPIDOGREL BISULFATE 75 MG PO TABS: 75.0000 mg | ORAL_TABLET | Freq: Every day | ORAL | 11 refills | Status: DC

## 2021-05-17 MED ORDER — ATORVASTATIN CALCIUM 80 MG PO TABS
80.0000 mg | ORAL_TABLET | Freq: Every day | ORAL | 11 refills | Status: DC
Start: 2021-05-17 — End: 2022-11-19

## 2021-05-17 MED ORDER — METFORMIN HCL 1000 MG PO TABS: 1000.0000 mg | ORAL_TABLET | Freq: Two times a day (BID) | ORAL | 11 refills | Status: AC

## 2021-05-17 MED ORDER — CARVEDILOL 12.5 MG PO TABS: 12.5000 mg | ORAL_TABLET | Freq: Two times a day (BID) | ORAL | 11 refills | Status: DC

## 2021-05-17 MED ORDER — GLIPIZIDE 10 MG PO TABS: 10.0000 mg | ORAL_TABLET | Freq: Two times a day (BID) | ORAL | 11 refills | Status: AC

## 2021-06-06 ENCOUNTER — Ambulatory Visit: Payer: Medicaid Other | Admitting: Internal Medicine

## 2021-06-23 ENCOUNTER — Telehealth: Payer: Self-pay

## 2021-06-23 NOTE — Telephone Encounter (Signed)
Pt wanted to inform PCP for Surgery on rotatory cuff 06/28/21

## 2021-06-28 ENCOUNTER — Telehealth: Payer: Self-pay | Admitting: *Deleted

## 2021-06-28 NOTE — Telephone Encounter (Signed)
Voicemail left to call back 

## 2021-06-28 NOTE — Telephone Encounter (Signed)
He needs to be medically stable back on medications before he should have surgery on his shoulder--he has not had a follow up since restarting meds to be certain he is.   Please find out who is to do surgery and would like for them to wait until we make sure he is stable for surgery. Notify patient as well.   Not sure when he has follow up with me and will need labs to support stability.

## 2021-06-28 NOTE — Telephone Encounter (Signed)
   Pre-operative Risk Assessment    Patient Name: Allen Cox.  DOB: 03-05-1971 MRN: [id number]      Request for Surgical Clearance   Procedure:  LEFT SHOULDER ARTHROSCOPY W/DECOMPRESSION Date of Surgery: Clearance 08/09/21                                 Surgeon:  SURGEON NOT LISTED Surgeon's Group or Practice Name:  [facility] Phone number:  [phone] Fax number:  [phone] ATTN: RITA   Type of Clearance Requested: - Medical  - Pharmacy:  Hold Aspirin and Clopidogrel (Plavix)     Type of Anesthesia:   General    Additional requests/questions: SURGEON'S OFFICE REQUEST COPY OF LABS AND EKG, OV NOTE ETC.   Signed, Danielle Rankin   06/28/2021, 2:23 PM

## 2021-06-28 NOTE — Telephone Encounter (Signed)
   Patient Name: Allen Cox.  DOB: 03/18/1971 MRN: [id number]  Primary Cardiologist: Tonny Bollman, MD  Chart reviewed as part of pre-operative protocol coverage. Patient is overdue for follow-up, last OV >2 years ago. The patient has an upcoming appointment already scheduled 07/12/21 with Jacolyn Reedy for surgical clearance. The appointment notes already include surgical clearance so that provider is aware to address at time of OV.   I will route to Dr. Excell Seltzer to inquire about holding ASA / Plavix for requested procedure for Elon Jester to review, if the patient is deemed stable to proceed at their follow-up OV.   Per office protocol, the cardiology provider should forward their finalized clearance decision to requesting party below. I will route this message as FYI to requesting party and remove this message from the pre-op box as separate preop APP input not needed at this time.  Please call with questions.  Laurann Montana, PA-C 06/28/2021, 2:41 PM

## 2021-07-03 ENCOUNTER — Other Ambulatory Visit: Payer: Medicaid Other

## 2021-07-03 NOTE — Telephone Encounter (Signed)
Pt reported that surgery was pushed back to December. Will speak with dr Delrae Alfred during follow-up

## 2021-07-06 ENCOUNTER — Other Ambulatory Visit: Payer: Self-pay

## 2021-07-06 ENCOUNTER — Encounter: Payer: Self-pay | Admitting: Internal Medicine

## 2021-07-06 ENCOUNTER — Ambulatory Visit: Payer: Self-pay | Admitting: Internal Medicine

## 2021-07-06 VITALS — BP 122/88 | HR 84 | Resp 18 | Ht 71.0 in | Wt 233.0 lb

## 2021-07-06 DIAGNOSIS — G4700 Insomnia, unspecified: Secondary | ICD-10-CM

## 2021-07-06 DIAGNOSIS — E119 Type 2 diabetes mellitus without complications: Secondary | ICD-10-CM

## 2021-07-06 DIAGNOSIS — G4733 Obstructive sleep apnea (adult) (pediatric): Secondary | ICD-10-CM

## 2021-07-06 DIAGNOSIS — E782 Mixed hyperlipidemia: Secondary | ICD-10-CM

## 2021-07-06 DIAGNOSIS — I255 Ischemic cardiomyopathy: Secondary | ICD-10-CM

## 2021-07-06 NOTE — Progress Notes (Signed)
Cardiology Office Note    Date:  07/12/2021   ID:  Allen Cox., DOB 1971-03-10, MRN [id number]   PCP:  Mack Hook, [place]  Cardiologist:  Sherren Mocha, MD   Advanced Practice Provider:  No care team member to display Electrophysiologist:  None   [phone]   Chief Complaint  Patient presents with   Pre-op Exam    History of Present Illness:  Allen Tieu. is a 50 y.o. male with history of CAD NSTEMI  2019 S/P CABG post op VT requiring defib/CPR, ICM EF 34% 05/2018 declined referal for ICD, f/u echo 01/2019 EF 40-45%, HLD, DM  Last telemedicine visit 05/04/19 SOB with activity in extreme heat. Was started on spironolactone.  Patient being seen today for preop clearance for left shoulder arthroscopy 08/09/21 by Dr. Devonne Doughty, [place].  Patient denies chest pain, palpitations. Chronic DOE that seems to have worsened slightly. Weight has fluctuated. Feels sluggish and off balance at times. Heat bothers him a lot. LDL 190 04/2021 after being off meds due to finances for a year.   Past Medical History:  Diagnosis Date   CAD (coronary artery disease) of bypass graft    DM (diabetes mellitus) ([place])    HLD (hyperlipidemia)    Ischemic cardiomyopathy    Tobacco use    VT (ventricular tachycardia)     Past Surgical History:  Procedure Laterality Date   CORONARY ARTERY BYPASS GRAFT N/A 01/08/2018   Procedure: CORONARY ARTERY BYPASS GRAFTING (CABG) x five, using Left Internal Mammary Artery and Right Leg Greater Saphenous Vein harvested endoscopically;  Surgeon: Melrose Nakayama, MD;  Location: [facility];  Service: Open Heart Surgery;  Laterality: N/A;   LEFT HEART CATH AND CORONARY ANGIOGRAPHY N/A 01/03/2018   Procedure: LEFT HEART CATH AND CORONARY ANGIOGRAPHY;  Surgeon: Jettie Booze, MD;  Location: [facility];  Service: Cardiovascular;  Laterality: N/A;   TEE WITHOUT CARDIOVERSION N/A 01/08/2018    Procedure: TRANSESOPHAGEAL ECHOCARDIOGRAM (TEE);  Surgeon: Melrose Nakayama, MD;  Location: [facility];  Service: Open Heart Surgery;  Laterality: N/A;   WRIST ARTHROPLASTY Right     Current Medications: Current Meds  Medication Sig   aspirin EC 81 MG tablet Take 1 tablet (81 mg total) by mouth daily.   atorvastatin (LIPITOR) 80 MG tablet Take 1 tablet (80 mg total) by mouth at bedtime.   blood glucose meter kit and supplies KIT Dispense based on patient and insurance preference. Use up to four times daily as directed. (FOR ICD-9 250.00, 250.01).   carvedilol (COREG) 12.5 MG tablet Take 1 tablet (12.5 mg total) by mouth 2 (two) times daily.   citalopram (CELEXA) 20 MG tablet Take 1 tablet (20 mg total) by mouth daily.   clopidogrel (PLAVIX) 75 MG tablet Take 1 tablet (75 mg total) by mouth daily.   ezetimibe (ZETIA) 10 MG tablet Take 1 tablet (10 mg total) by mouth daily.   glipiZIDE (GLUCOTROL) 10 MG tablet Take 1 tablet (10 mg total) by mouth 2 (two) times daily before a meal.   glucose blood (TRUE METRIX BLOOD GLUCOSE TEST) test strip Check blood glucose twice daily before meals   Lancets MISC Check blood glucose twice daily before meals   metFORMIN (GLUCOPHAGE) 1000 MG tablet Take 1 tablet (1,000 mg total) by mouth 2 (two) times daily with a meal.   sildenafil (VIAGRA) 50 MG tablet Take 1 tablet (50 mg total) by mouth  daily as needed for erectile dysfunction.   spironolactone (ALDACTONE) 25 MG tablet Take 0.5 tablets (12.5 mg total) by mouth daily.     Allergies:   Patient has no known allergies.   Social History   Socioeconomic History   Marital status: Single    Spouse name: Not on file   Number of children: Not on file   Years of education: Not on file   Highest education level: Not on file  Occupational History   Not on file  Tobacco Use   Smoking status: Former    Types: Cigars, Pipe    Quit date: 09/04/1987    Years since quitting: 33.8   Smokeless tobacco: Never   Vaping Use   Vaping Use: Never used  Substance and Sexual Activity   Alcohol use: Not Currently   Drug use: Never   Sexual activity: Not on file  Other Topics Concern   Not on file  Social History Narrative   Not on file   Social Determinants of Health   Financial Resource Strain: Not on file  Food Insecurity: Not on file  Transportation Needs: Not on file  Physical Activity: Not on file  Stress: Not on file  Social Connections: Not on file     Family History:  The patient's  family history includes Stroke in his sister.   ROS:   Please see the history of present illness.    ROS All other systems reviewed and are negative.   PHYSICAL EXAM:   VS:  BP 106/68   Pulse 82   Ht '5\' 11"'  (1.803 m)   Wt 256 lb 3.2 oz (116.2 kg)   SpO2 97%   BMI 35.73 kg/m   Physical Exam  GEN: Obese in no acute distress  Neck: no JVD, carotid bruits, or masses Cardiac:RRR; no murmurs, rubs, or gallops  Respiratory:  clear to auscultation bilaterally, normal work of breathing GI: soft, nontender, nondistended, + BS Ext: without cyanosis, clubbing, or edema, Good distal pulses bilaterally Neuro:  Alert and Oriented x 3, Psych: euthymic mood, full affect  Wt Readings from Last 3 Encounters:  07/12/21 256 lb 3.2 oz (116.2 kg)  07/06/21 233 lb (105.7 kg)  04/20/21 243 lb (110.2 kg)      Studies/Labs Reviewed:   EKG:  EKG is not ordered today.     Recent Labs: 04/20/2021: ALT 37; BUN 10; Creatinine, Ser 0.93; Hemoglobin 14.8; Platelets 238; Potassium 5.0; Sodium 135; TSH 1.630   Lipid Panel    Component Value Date/Time   CHOL 256 (H) 04/20/2021 1151   TRIG 97 04/20/2021 1151   HDL 49 04/20/2021 1151   CHOLHDL 2.7 10/13/2018 1348   CHOLHDL 5.6 01/04/2018 0433   VLDL 19 01/04/2018 0433   LDLCALC 190 (H) 04/20/2021 1151    Additional studies/ records that were reviewed today include:    Echocardiogram 01/15/2019 Mid-apical, septal akinesis, EF 40-45, mild LVH   Cardiac MRI  05/15/2018 IMPRESSION: 1. Mildly dilated left ventricle with normal wall thickness and severely decreased systolic function (LVEF = 34%). There is hypokinesis in the mid anteroseptal, anterior and apical septal, anterior walls and in the true apex.   There is late gadolinium enhancement in the following segments: Mid anterior and anteroseptal walls (25-50%), apical septal and anterior walls (50-75%) and true apex (75-100%).   2. Normal right ventricular size, thickness and systolic function (LVEF = 48%). There are no regional wall motion abnormalities.   3.  Normal left and right atrial size.  4. Normal size of the aortic root, ascending aorta and pulmonary artery.   No improvement of LVEF since the last echocardiogram on 02/13/2018, an ICD implantation is recommended.     Echocardiogram 02/13/2018 Moderate LVH, EF 30-35, apical hypokinesis, grade 2 diastolic dysfunction   Carotid US 01/04/2018 No significant carotid stenosis   Echocardiogram 01/04/2018  mild LVH, EF 30-35   Cardiac catheterization 01/03/2018 LAD proximal 99; D2 60 LCx proximal 75; OM1 75, OM2 40 RCA proximal 25, mid 60, distal 25; RP AV 100 EF 25-35   Risk Assessment/Calculations:         ASSESSMENT:    1. Preoperative clearance   2. S/P CABG x 5   3. Ischemic cardiomyopathy   4. Chronic systolic CHF (congestive heart failure) ([place])   5. Essential hypertension   6. Hyperlipidemia, unspecified hyperlipidemia type   7. Diabetes mellitus type 2 in nonobese [facility])      PLAN:  In order of problems listed above:  Preop clearance for shoulder arthroscopy 08/09/21 Dr. Nigel Berthold Hayes-patient with history of CAD status post CABG in 2019 with postop VT requiring CPR and defibrillation.  Also has ischemic cardiomyopathy last EF 40 to 45% but was out of his meds for over a year.  Has restarted them.  Chronic dyspnea on exertion may be slightly worsened in the past year but was off his meds.  No chest pain.  We  will update 2D echo.  No chest pain. Will wait on echo before clearing for surgery. According to the Revised Cardiac Risk Index (RCRI), his Perioperative Risk of Major Cardiac Event is (%): 6.6  His Functional Capacity in METs is: 5.07 according to the [facility] Activity Status Index (DASI).   CAD S/P NSTEMI, CABG 2019 post op VT requiring defib/CPR  ICM EF 34% 2019, f/u echo 01/2019 EF 40-45% check echo  Chronic systolic CHF compensated  HTN blood pressure stable running on the low side  HLD LDL 190 04/2021 when off meds.  Back on Lipitor and Zetia per PCP.  To have labs drawn Friday.  DM2 A1c 15.3 04/2021 now on glipizide and metformin followed by PCP  Shared Decision Making/Informed Consent        Medication Adjustments/Labs and Tests Ordered: Current medicines are reviewed at length with the patient today.  Concerns regarding medicines are outlined above.  Medication changes, Labs and Tests ordered today are listed in the Patient Instructions below. Patient Instructions  Medication Instructions:  Your physician recommends that you continue on your current medications as directed. Please refer to the Current Medication list given to you today.  *If you need a refill on your cardiac medications before your next appointment, please call your pharmacy*   Lab Work: None If you have labs (blood work) drawn today and your tests are completely normal, you will receive your results only by: [place] (if you have MyChart) OR A paper copy in the mail If you have any lab test that is abnormal or we need to change your treatment, we will call you to review the results.   Testing/Procedures: Your physician has requested that you have an echocardiogram, next available. Echocardiography is a painless test that uses sound waves to create images of your heart. It provides your doctor with information about the size and shape of your heart and how well your heart's chambers and valves are  working. This procedure takes approximately one hour. There are no restrictions for this procedure.    Follow-Up: At [facility],  you and your health needs are our priority.  As part of our continuing mission to provide you with exceptional heart care, we have created designated Provider Care Teams.  These Care Teams include your primary Cardiologist (physician) and Advanced Practice Providers (APPs -  Physician Assistants and Nurse Practitioners) who all work together to provide you with the care you need, when you need it.  We recommend signing up for the patient portal called "MyChart".  Sign up information is provided on this After Visit Summary.  MyChart is used to connect with patients for Virtual Visits (Telemedicine).  Patients are able to view lab/test results, encounter notes, upcoming appointments, etc.  Non-urgent messages can be sent to your provider as well.   To learn more about what you can do with MyChart, go to [email or web address].    Your next appointment:   4 month(s) or sooner if needed based on ECHO results  The format for your next appointment:   In Person  Provider:   Sherren Mocha, MD {   Other Instructions You have been referred to see Dr Debara Pickett in the lipid clinic.     Sumner Boast, PA-C  07/12/2021 1:15 PM    [facility] [address], [place], [place]  [address] Phone: [phone]; Fax: [phone]

## 2021-07-06 NOTE — Progress Notes (Signed)
Subjective:    Patient ID: Allen Cox., male   DOB: [birth year], 50 y.o.   MRN: [id number]   HPI   DM:  restarted on meds.  Has been taking for at least 1 month.  Checking sugars and getting 110.  He has not checked in the evening recently.   Started eating off of smaller plates, so eating less.  Down 10 lbs from August.  Describes a good diet.    2.  Ischemic cardiomyopathy:  Breathing at times labored.  Generally, feeling better since back on meds.  He feels drained whenever he is is too warm, however--for instance when cooking and standing in front of a stove.    3.  Elevated BP:  diastolic was high last check.  Down with meds.    4.  Insomnia:  since restarted meds about 1 month ago.  Problems initiating and remaining asleep--waking up at 3 or 4 a.m. and "wired."  If able to fall back to sleep, having nightmares.   Outside 5/7 days for about 1 hour--walks.    Occasional cup of coffee.   Awakens with a start--feels he is awakening from dreams. Does snore and had a sleep study done in 2002.  Was set up with CPAP at the time.   Not a [employer].   Current Meds  Medication Sig   aspirin EC 81 MG tablet Take 1 tablet (81 mg total) by mouth daily.   atorvastatin (LIPITOR) 80 MG tablet Take 1 tablet (80 mg total) by mouth at bedtime.   blood glucose meter kit and supplies KIT Dispense based on patient and insurance preference. Use up to four times daily as directed. (FOR ICD-9 250.00, 250.01).   carvedilol (COREG) 12.5 MG tablet Take 1 tablet (12.5 mg total) by mouth 2 (two) times daily.   citalopram (CELEXA) 20 MG tablet Take 1 tablet (20 mg total) by mouth daily.   clopidogrel (PLAVIX) 75 MG tablet Take 1 tablet (75 mg total) by mouth daily.   ezetimibe (ZETIA) 10 MG tablet Take 1 tablet (10 mg total) by mouth daily.   glipiZIDE (GLUCOTROL) 10 MG tablet Take 1 tablet (10 mg total) by mouth 2 (two) times daily before a meal.   glucose blood (TRUE METRIX BLOOD GLUCOSE TEST)  test strip Check blood glucose twice daily before meals   Lancets MISC Check blood glucose twice daily before meals   metFORMIN (GLUCOPHAGE) 1000 MG tablet Take 1 tablet (1,000 mg total) by mouth 2 (two) times daily with a meal.   No Known Allergies   Review of Systems    Objective:   BP 122/88 (BP Location: Left Arm, Patient Position: Sitting, Cuff Size: Normal)   Pulse 84   Resp 18   Ht 5' 11" (1.803 m)   Wt 233 lb (105.7 kg)   BMI 32.50 kg/m   Physical Exam NAD HEENT:  PERRL, EOMI, Discs sharp Neck:  supple, No adenopathy Chest:  CTA CV:  RRR without murmur or rub.  Radial and DP pulses normal and equal. Abd:  S, NT, No HSM or mass, + BS LE:  No edema.   Assessment & Plan    DM:  sounds like this is improving.  A1C in 3 months as just started back on meds.  With me just after labs.  2.  Hyperlipidemia:  FLP and hepatic profile in 1 month.  3.  Hypertension and Cardiomyopathy:  improved with meds.    4.  History of  OSA and now with insomnia;  discussed healthy sleep hygiene and what to do if unable to fall asleep.  Currently unable to afford sleep study as just with Medicaid family planning.    5.  HM:  refusing vaccines today, including Shingrix, influenza, COVID.

## 2021-07-10 ENCOUNTER — Other Ambulatory Visit: Payer: Medicaid Other

## 2021-07-12 ENCOUNTER — Ambulatory Visit (INDEPENDENT_AMBULATORY_CARE_PROVIDER_SITE_OTHER): Payer: Self-pay | Admitting: Physician Assistant

## 2021-07-12 ENCOUNTER — Other Ambulatory Visit: Payer: Self-pay

## 2021-07-12 ENCOUNTER — Encounter: Payer: Self-pay | Admitting: Physician Assistant

## 2021-07-12 VITALS — BP 106/68 | HR 82 | Ht 71.0 in | Wt 256.2 lb

## 2021-07-12 DIAGNOSIS — E785 Hyperlipidemia, unspecified: Secondary | ICD-10-CM

## 2021-07-12 DIAGNOSIS — I5022 Chronic systolic (congestive) heart failure: Secondary | ICD-10-CM

## 2021-07-12 DIAGNOSIS — I255 Ischemic cardiomyopathy: Secondary | ICD-10-CM

## 2021-07-12 DIAGNOSIS — I10 Essential (primary) hypertension: Secondary | ICD-10-CM

## 2021-07-12 DIAGNOSIS — Z951 Presence of aortocoronary bypass graft: Secondary | ICD-10-CM

## 2021-07-12 DIAGNOSIS — Z01810 Encounter for preprocedural cardiovascular examination: Secondary | ICD-10-CM

## 2021-07-12 DIAGNOSIS — E119 Type 2 diabetes mellitus without complications: Secondary | ICD-10-CM

## 2021-07-12 DIAGNOSIS — Z01818 Encounter for other preprocedural examination: Secondary | ICD-10-CM

## 2021-07-12 NOTE — Patient Instructions (Signed)
Medication Instructions:  Your physician recommends that you continue on your current medications as directed. Please refer to the Current Medication list given to you today.  *If you need a refill on your cardiac medications before your next appointment, please call your pharmacy*   Lab Work: None If you have labs (blood work) drawn today and your tests are completely normal, you will receive your results only by: MyChart Message (if you have MyChart) OR A paper copy in the mail If you have any lab test that is abnormal or we need to change your treatment, we will call you to review the results.   Testing/Procedures: Your physician has requested that you have an echocardiogram, next available. Echocardiography is a painless test that uses sound waves to create images of your heart. It provides your doctor with information about the size and shape of your heart and how well your heart's chambers and valves are working. This procedure takes approximately one hour. There are no restrictions for this procedure.    Follow-Up: At [facility], you and your health needs are our priority.  As part of our continuing mission to provide you with exceptional heart care, we have created designated Provider Care Teams.  These Care Teams include your primary Cardiologist (physician) and Advanced Practice Providers (APPs -  Physician Assistants and Nurse Practitioners) who all work together to provide you with the care you need, when you need it.  We recommend signing up for the patient portal called "MyChart".  Sign up information is provided on this After Visit Summary.  MyChart is used to connect with patients for Virtual Visits (Telemedicine).  Patients are able to view lab/test results, encounter notes, upcoming appointments, etc.  Non-urgent messages can be sent to your provider as well.   To learn more about what you can do with MyChart, go to [email or web address].    Your next appointment:   4  month(s) or sooner if needed based on ECHO results  The format for your next appointment:   In Person  Provider:   Tonny Bollman, MD {   Other Instructions You have been referred to see Dr Rennis Golden in the lipid clinic.

## 2021-07-14 ENCOUNTER — Other Ambulatory Visit: Payer: Self-pay

## 2021-07-14 ENCOUNTER — Other Ambulatory Visit: Payer: Medicaid Other

## 2021-07-14 DIAGNOSIS — E782 Mixed hyperlipidemia: Secondary | ICD-10-CM

## 2021-07-14 DIAGNOSIS — E119 Type 2 diabetes mellitus without complications: Secondary | ICD-10-CM | POA: Diagnosis not present

## 2021-07-15 LAB — HEMOGLOBIN A1C
Est. average glucose Bld gHb Est-mCnc: 318 mg/dL
Hgb A1c MFr Bld: 12.7 % — ABNORMAL HIGH (ref 4.8–5.6)

## 2021-07-15 LAB — LIPID PANEL W/O CHOL/HDL RATIO
Cholesterol, Total: 124 mg/dL (ref 100–199)
HDL: 47 mg/dL (ref >39)
LDL Chol Calc (NIH): 64 mg/dL (ref 0–99)
Triglycerides: 61 mg/dL (ref 0–149)
VLDL Cholesterol Cal: 13 mg/dL (ref 5–40)

## 2021-08-04 ENCOUNTER — Telehealth: Payer: Self-pay | Admitting: Cardiovascular Disease

## 2021-08-04 ENCOUNTER — Ambulatory Visit (HOSPITAL_COMMUNITY): Payer: Medicaid Other | Attending: Physician Assistant

## 2021-08-04 ENCOUNTER — Other Ambulatory Visit: Payer: Self-pay

## 2021-08-04 DIAGNOSIS — I255 Ischemic cardiomyopathy: Secondary | ICD-10-CM | POA: Insufficient documentation

## 2021-08-04 DIAGNOSIS — Z01818 Encounter for other preprocedural examination: Secondary | ICD-10-CM | POA: Insufficient documentation

## 2021-08-04 LAB — ECHOCARDIOGRAM COMPLETE
Area-P 1/2: 4.06 cm2
S' Lateral: 4.7 cm

## 2021-08-04 MED ORDER — PERFLUTREN LIPID MICROSPHERE
1.0000 mL | INTRAVENOUS | Status: AC | PRN
  Administered 2021-08-04: 3 mL via INTRAVENOUS

## 2021-08-04 NOTE — Telephone Encounter (Signed)
Follow Up:      Patient is calling to check on the patient's clearance. She needs this asap. Please fax to [phone].

## 2021-08-07 NOTE — Telephone Encounter (Signed)
Dr. Excell Seltzer, on Mr. Allen Cox- pt with history of CAD NSTEMI  2019 S/P CABG post op VT requiring defib/CPR, ICM EF 34% 05/2018 declined referal for ICD, f/u echo 01/2019 EF 40-45%, HLD, DM  anyway needs shoulder arthroscopy 12/7 and can he stop ASA plavix?   They may need to reschedule anyway because he is still taking.  But OK to hold?  Thanks.    Echo was repeated  EF 40%, similar to 2 years ago. thanks

## 2021-08-07 NOTE — Telephone Encounter (Signed)
Surgeon's office called today, inquiring if the echo the pt had done has been read and if the pt has been cleared. In reviewing the echo and the office note from when the pt was seen by Herma Carson, PAC, it was noticed that medications to be held had not been addressed. Urgent message has been to Dr. Excell Seltzer for his recommendations in regard to ASA/Plavix hold time. Per pre op provider today Nada Boozer, NP; pt's surgery will most likely need to be post-poned as the pt has yet to begin holding blood thinners. Per pre op provider today I will update the surgeon's office as the clearance and waiting for recommendations from Dr. Excell Seltzer. Once the pt is cleared and we have medication recommendations we will send clearance.

## 2021-08-07 NOTE — Telephone Encounter (Signed)
I s/w [place] Sink, surgery scheduler for Dr. Delrae Sawyers. I informed [place] Sink that echo has been read and pt has been cleared. However, except that we are still needing recommendations from Dr. Excell Seltzer in regard to ASA/Plavix hold time. Per [place] Sink, she states Dr. Madilyn Fireman had told the pt to hold blood thinners x 2 days prior. I explained to he that pt has not yet begun to hold blood thinners. I explained that our pre op provider did send over an URGENT message to Dr. Excell Seltzer in regard to the medications. I stated that per pre op provider today Nada Boozer, NP pt's surgery will need to be post poned until we have cardiologist input on ASA/Plavix. [place] Sink, verbalized understanding. I will fax these new notes to surgeon's office as well.

## 2021-08-08 NOTE — Telephone Encounter (Addendum)
   Name: Allen Cox.  DOB: 1971-08-26  MRN: [id number]   Primary Cardiologist: Tonny Bollman, MD  Chart reviewed as part of pre-operative protocol coverage. Patient was contacted 08/08/2021 in reference to pre-operative risk assessment for pending surgery as outlined below.  Lewin L Jonna Clark. was last seen on 07/12/21 by Jacolyn Reedy PAC.  Echocardiogram was repeated and appeared stable with mild ischemic cardiomyopathy (LVEF 40%). He was cleared for surgery after this echo.   Through direct verbal communication with Dr. Excell Seltzer: OK to hold ASA and plavix for shoulder surgery. Resume DAPT after surgery when felt safe, per surgeon.  Therefore, based on ACC/AHA guidelines, the patient would be at acceptable risk for the planned procedure without further cardiovascular testing.   I will route this recommendation to the requesting party via Epic fax function and remove from pre-op pool. Please call with questions.  Roe Rutherford Tejasvi Brissett, PA 08/08/2021, 12:29 PM

## 2021-08-08 NOTE — Telephone Encounter (Signed)
I will send back to pre op pool as we are awaiting on recommendations from Dr. Excell Seltzer. See previous notes.

## 2021-08-08 NOTE — Telephone Encounter (Signed)
Pt called in today and informed that he has indeed began holding his blood thinner Plavix. Pt states he last took Plavix 08/06/21 and none since then. Pt states that the surgeon told him to hold x 2 days prior to surgery. I did tell the pt that we have sent an message to Dr. Excell Seltzer for his recommendations as the blood thinners, ASA/Plavix. I informed the pt that I will have pre op provider reach out to him in a few minutes, Micah Flesher, PAC. Confirmed ph# [phone]. Pt thanked me for the help.

## 2021-08-08 NOTE — Telephone Encounter (Signed)
Agree. thanks

## 2021-08-08 NOTE — Telephone Encounter (Signed)
Pt is returning a call to the Pre Op Team regarding his Surgery Clearance for 08/09/21

## 2021-08-08 NOTE — Telephone Encounter (Signed)
S/w the pt and he is aware he has been cleared for his surgery. Pt has been holding his blood thinners as of 08/07/21, last dose was 08/06/21, see notes from earlier today. Pt aware he will resume blood thinners once surgeon feels safe from bleeding risks. I assured the pt that I will fax notes to surgeons office. Pt thanked me for call and the help.Allen Cox

## 2021-08-09 DIAGNOSIS — M75102 Unspecified rotator cuff tear or rupture of left shoulder, not specified as traumatic: Secondary | ICD-10-CM | POA: Insufficient documentation

## 2021-08-09 DIAGNOSIS — G8929 Other chronic pain: Secondary | ICD-10-CM | POA: Insufficient documentation

## 2021-09-20 ENCOUNTER — Other Ambulatory Visit: Payer: Self-pay

## 2021-09-20 ENCOUNTER — Emergency Department (HOSPITAL_COMMUNITY)
Admission: EM | Admit: 2021-09-20 | Discharge: 2021-09-20 | Disposition: A | Discharge status: Home / Self Care | Payer: Self-pay | Attending: Emergency Medicine | Admitting: Emergency Medicine

## 2021-09-20 ENCOUNTER — Emergency Department (HOSPITAL_COMMUNITY): Payer: Self-pay

## 2021-09-20 ENCOUNTER — Encounter (HOSPITAL_COMMUNITY): Payer: Self-pay | Admitting: Oncology

## 2021-09-20 DIAGNOSIS — Z7984 Long term (current) use of oral hypoglycemic drugs: Secondary | ICD-10-CM | POA: Insufficient documentation

## 2021-09-20 DIAGNOSIS — Z7902 Long term (current) use of antithrombotics/antiplatelets: Secondary | ICD-10-CM | POA: Insufficient documentation

## 2021-09-20 DIAGNOSIS — Z7982 Long term (current) use of aspirin: Secondary | ICD-10-CM | POA: Insufficient documentation

## 2021-09-20 DIAGNOSIS — G44209 Tension-type headache, unspecified, not intractable: Secondary | ICD-10-CM | POA: Insufficient documentation

## 2021-09-20 DIAGNOSIS — R519 Headache, unspecified: Secondary | ICD-10-CM

## 2021-09-20 MED ORDER — AMOXICILLIN-POT CLAVULANATE 875-125 MG PO TABS: 1.0000 | ORAL_TABLET | Freq: Two times a day (BID) | ORAL | 0 refills | Status: DC

## 2021-09-20 MED ORDER — IBUPROFEN 200 MG PO TABS
600.0000 mg | ORAL_TABLET | Freq: Once | ORAL | Status: AC
  Administered 2021-09-20: 600 mg via ORAL
  Filled 2021-09-20: qty 3

## 2021-09-20 MED ORDER — ACETAMINOPHEN 325 MG PO TABS
650.0000 mg | ORAL_TABLET | Freq: Once | ORAL | Status: AC
  Administered 2021-09-20: 650 mg via ORAL
  Filled 2021-09-20: qty 2

## 2021-09-20 NOTE — ED Triage Notes (Signed)
Pt bib [facility] from [facility] d/t migraine.

## 2021-09-20 NOTE — ED Provider Notes (Signed)
[facility]-EMERGENCY DEPT Provider Note   CSN: [id number] Arrival date & time: 09/20/21  0946     History  Chief Complaint  Patient presents with   Migraine    Allen L Prayan Ulin. is a 51 y.o. male.  Patient presents ER chief complaint of a headache.  Describes a sudden headache that woke him up around 530 to 6 AM.  Describes a sharp severe pain in the left frontal head region radiating to the back.  He went to urgent care was sent to the ER due to his symptoms.  Otherwise denies any fevers or cough no vomiting or diarrhea.      Home Medications Prior to Admission medications   Medication Sig Start Date End Date Taking? Authorizing Provider  amoxicillin-clavulanate (AUGMENTIN) 875-125 MG tablet Take 1 tablet by mouth every 12 (twelve) hours. 09/20/21  Yes Cheryll Cockayne, MD  aspirin EC 81 MG tablet Take 1 tablet (81 mg total) by mouth daily. 01/30/18   Bhagat, Sharrell Ku, PA  atorvastatin (LIPITOR) 80 MG tablet Take 1 tablet (80 mg total) by mouth at bedtime. 05/17/21   Julieanne Manson, MD  blood glucose meter kit and supplies KIT Dispense based on patient and insurance preference. Use up to four times daily as directed. (FOR ICD-9 250.00, 250.01). 01/21/18   Loreli Slot, MD  carvedilol (COREG) 12.5 MG tablet Take 1 tablet (12.5 mg total) by mouth 2 (two) times daily. 05/17/21 05/12/22  Julieanne Manson, MD  citalopram (CELEXA) 20 MG tablet Take 1 tablet (20 mg total) by mouth daily. 05/17/21   Julieanne Manson, MD  clopidogrel (PLAVIX) 75 MG tablet Take 1 tablet (75 mg total) by mouth daily. 05/17/21   Julieanne Manson, MD  ezetimibe (ZETIA) 10 MG tablet Take 1 tablet (10 mg total) by mouth daily. 05/17/21   Julieanne Manson, MD  glipiZIDE (GLUCOTROL) 10 MG tablet Take 1 tablet (10 mg total) by mouth 2 (two) times daily before a meal. 05/17/21   Julieanne Manson, MD  glucose blood (TRUE METRIX BLOOD GLUCOSE TEST) test strip Check blood  glucose twice daily before meals 02/11/18   Julieanne Manson, MD  Lancets MISC Check blood glucose twice daily before meals 02/11/18   Julieanne Manson, MD  metFORMIN (GLUCOPHAGE) 1000 MG tablet Take 1 tablet (1,000 mg total) by mouth 2 (two) times daily with a meal. 05/17/21   Julieanne Manson, MD  sildenafil (VIAGRA) 50 MG tablet Take 1 tablet (50 mg total) by mouth daily as needed for erectile dysfunction. 10/09/18   Tonny Bollman, MD  spironolactone (ALDACTONE) 25 MG tablet Take 0.5 tablets (12.5 mg total) by mouth daily. 05/19/19   Tereso Newcomer T, PA-C      Allergies    Patient has no known allergies.    Review of Systems   Review of Systems  Constitutional:  Negative for fever.  HENT:  Negative for ear pain and sore throat.   Eyes:  Negative for pain.  Respiratory:  Negative for cough.   Cardiovascular:  Negative for chest pain.  Gastrointestinal:  Negative for abdominal pain.  Genitourinary:  Negative for flank pain.  Musculoskeletal:  Negative for back pain.  Skin:  Negative for color change and rash.  Neurological:  Negative for syncope.  All other systems reviewed and are negative.  Physical Exam Updated Vital Signs BP (!) 149/97    Pulse 83    Temp 98.1 F (36.7 C) (Oral)    Resp 18    SpO2 96%  Physical Exam Constitutional:      Appearance: He is well-developed.  HENT:     Head: Normocephalic.     Nose: Nose normal.  Eyes:     Extraocular Movements: Extraocular movements intact.  Cardiovascular:     Rate and Rhythm: Normal rate.  Pulmonary:     Effort: Pulmonary effort is normal.  Skin:    Coloration: Skin is not jaundiced.  Neurological:     General: No focal deficit present.     Mental Status: He is alert and oriented to person, place, and time. Mental status is at baseline.     Cranial Nerves: No cranial nerve deficit.     Motor: No weakness.     Gait: Gait normal.    ED Results / Procedures / Treatments   Labs (all labs ordered are listed, but  only abnormal results are displayed) Labs Reviewed - No data to display  EKG EKG Interpretation  Date/Time:  Wednesday September 20 2021 10:13:19 EST Ventricular Rate:  83 PR Interval:  141 QRS Duration: 138 QT Interval:  403 QTC Calculation: 474 R Axis:   -65 Text Interpretation: Sinus rhythm Right bundle branch block Anterior infarct, old Confirmed by Thamas Jaegers ([id number]) on 09/20/2021 10:55:38 AM  Radiology CT Head Wo Contrast  Result Date: 09/20/2021 CLINICAL DATA:  Severe headache. EXAM: CT HEAD WITHOUT CONTRAST TECHNIQUE: Contiguous axial images were obtained from the base of the skull through the vertex without intravenous contrast. RADIATION DOSE REDUCTION: This exam was performed according to the departmental dose-optimization program which includes automated exposure control, adjustment of the mA and/or kV according to patient size and/or use of iterative reconstruction technique. COMPARISON:  10/05/2020 FINDINGS: Brain: There is no evidence for acute hemorrhage, hydrocephalus, mass lesion, or abnormal extra-axial fluid collection. No definite CT evidence for acute infarction. Vascular: No hyperdense vessel or unexpected calcification. Skull: No evidence for fracture. No worrisome lytic or sclerotic lesion. Sinuses/Orbits: Incomplete visualization of left maxillary sinus with subtotal opacification. Remaining visualized paranasal sinuses and mastoid air cells are clear. Visualized portions of the globes and intraorbital fat are unremarkable. Other: None. IMPRESSION: 1. No acute intracranial abnormality. 2. Incomplete visualization of left maxillary sinus opacification, new since prior. This could be related to acute or chronic sinusitis. Electronically Signed   By: Misty Stanley M.D.   On: 09/20/2021 10:37    Procedures Procedures    Medications Ordered in ED Medications  acetaminophen (TYLENOL) tablet 650 mg (650 mg Oral Given 09/20/21 1129)  ibuprofen (ADVIL) tablet 600 mg (600 mg  Oral Given 09/20/21 1129)    ED Course/ Medical Decision Making/ A&P                           Medical Decision Making Patient sent from urgent care for severe sudden onset headache, concern for subarachnoid hemorrhage per prior providers note.  He has no meningismal signs he has no focal neurodeficit no reports of fevers.  Amount and/or Complexity of Data Reviewed External Data Reviewed: notes.    Details: Note from urgent care this morning reviewed by myself.  Concern for headache sudden onset. Radiology: ordered. Decision-making details documented in ED Course.  Risk OTC drugs. Risk Details: Differential includes subarachnoid hemorrhage, meningitis, migraine headache, versus other causes.  Given CT scan of the brain done less than 6 hours within onset of symptoms is unremarkable I doubt subarachnoid hemorrhage.  Imaging concerning for left frontal maxillary sinusitis which is in the location  of his new headache.  Will treat with antibiotics.  Advised outpatient follow-up with his doctor within the week.  Advising immediate return for fevers worsening symptoms or any additional concerns.           Final Clinical Impression(s) / ED Diagnoses Final diagnoses:  Acute nonintractable headache, unspecified headache type    Rx / DC Orders ED Discharge Orders          Ordered    amoxicillin-clavulanate (AUGMENTIN) 875-125 MG tablet  Every 12 hours        09/20/21 1131              Luna Fuse, MD 09/20/21 1131

## 2021-09-20 NOTE — ED Notes (Signed)
Dc instructions and scripts reviewed with pt no questions at this time. Will follow up with dental surgeon and pcp. Ambulated without difficulty to waiting room to wait for transportation

## 2021-09-20 NOTE — Discharge Instructions (Signed)
Follow-up with your dental surgeon or primary care doctor within this week.  Return to the ER if your symptoms worsen if you have new numbness weakness if you have fevers or any additional concerns.

## 2021-10-04 ENCOUNTER — Encounter: Payer: Self-pay | Admitting: Cardiovascular Disease

## 2021-10-04 ENCOUNTER — Other Ambulatory Visit: Payer: Self-pay

## 2021-10-04 ENCOUNTER — Ambulatory Visit (INDEPENDENT_AMBULATORY_CARE_PROVIDER_SITE_OTHER): Payer: Self-pay | Admitting: Cardiovascular Disease

## 2021-10-04 VITALS — BP 120/86 | HR 97 | Ht 72.0 in | Wt 249.2 lb

## 2021-10-04 DIAGNOSIS — I2510 Atherosclerotic heart disease of native coronary artery without angina pectoris: Secondary | ICD-10-CM

## 2021-10-04 DIAGNOSIS — I10 Essential (primary) hypertension: Secondary | ICD-10-CM

## 2021-10-04 DIAGNOSIS — E782 Mixed hyperlipidemia: Secondary | ICD-10-CM

## 2021-10-04 DIAGNOSIS — I5022 Chronic systolic (congestive) heart failure: Secondary | ICD-10-CM

## 2021-10-04 MED ORDER — ENTRESTO 24-26 MG PO TABS: 1.0000 | ORAL_TABLET | Freq: Two times a day (BID) | ORAL | 12 refills | Status: DC

## 2021-10-04 NOTE — Patient Instructions (Signed)
Medication Instructions:  START Entresto once daily *If you need a refill on your cardiac medications before your next appointment, please call your pharmacy*   Lab Work: BMET 3 weeks If you have labs (blood work) drawn today and your tests are completely normal, you will receive your results only by: [place] (if you have MyChart) OR A paper copy in the mail If you have any lab test that is abnormal or we need to change your treatment, we will call you to review the results.   Testing/Procedures: NONE   Follow-Up: At [facility], you and your health needs are our priority.  As part of our continuing mission to provide you with exceptional heart care, we have created designated Provider Care Teams.  These Care Teams include your primary Cardiologist (physician) and Advanced Practice Providers (APPs -  Physician Assistants and Nurse Practitioners) who all work together to provide you with the care you need, when you need it.  We recommend signing up for the patient portal called "MyChart".  Sign up information is provided on this After Visit Summary.  MyChart is used to connect with patients for Virtual Visits (Telemedicine).  Patients are able to view lab/test results, encounter notes, upcoming appointments, etc.  Non-urgent messages can be sent to your provider as well.   To learn more about what you can do with MyChart, go to [email or web address].    Your next appointment:   6 month(s)  The format for your next appointment:   In Person  Provider:   Melina Copa, PA-C, Ermalinda Barrios, PA-C, Christen Bame, NP, or Richardson Dopp, PA-C     Then, Sherren Mocha, MD will plan to see you again in 1 year(s).    Other Instructions Thank you for allowing myself and [place] to serve you, God Bless!!
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
## 2021-10-08 ENCOUNTER — Encounter: Payer: Self-pay | Admitting: Internal Medicine

## 2021-10-08 DIAGNOSIS — I10 Essential (primary) hypertension: Secondary | ICD-10-CM | POA: Insufficient documentation

## 2021-10-08 HISTORY — DX: Essential (primary) hypertension: I10

## 2021-10-26 ENCOUNTER — Other Ambulatory Visit: Payer: Self-pay

## 2021-10-26 DIAGNOSIS — I10 Essential (primary) hypertension: Secondary | ICD-10-CM

## 2021-10-26 DIAGNOSIS — I5022 Chronic systolic (congestive) heart failure: Secondary | ICD-10-CM

## 2021-10-26 DIAGNOSIS — E782 Mixed hyperlipidemia: Secondary | ICD-10-CM

## 2021-10-26 LAB — BASIC METABOLIC PANEL
BUN/Creatinine Ratio: 12 (ref 9–20)
BUN: 10 mg/dL (ref 6–24)
CO2: 27 mmol/L (ref 20–29)
Calcium: 9.4 mg/dL (ref 8.7–10.2)
Chloride: 100 mmol/L (ref 96–106)
Creatinine, Ser: 0.85 mg/dL (ref 0.76–1.27)
Glucose: 281 mg/dL — ABNORMAL HIGH (ref 70–99)
Potassium: 4.6 mmol/L (ref 3.5–5.2)
Sodium: 138 mmol/L (ref 134–144)
eGFR: 105 mL/min/{1.73_m2} (ref >59)

## 2021-11-23 ENCOUNTER — Other Ambulatory Visit: Payer: Medicaid Other

## 2021-11-23 ENCOUNTER — Other Ambulatory Visit: Payer: Self-pay

## 2021-11-23 DIAGNOSIS — E119 Type 2 diabetes mellitus without complications: Secondary | ICD-10-CM | POA: Diagnosis not present

## 2021-11-24 LAB — HEMOGLOBIN A1C
Est. average glucose Bld gHb Est-mCnc: 306 mg/dL
Hgb A1c MFr Bld: 12.3 % — ABNORMAL HIGH (ref 4.8–5.6)

## 2021-12-08 ENCOUNTER — Ambulatory Visit: Payer: Medicaid Other | Admitting: Internal Medicine

## 2022-03-29 ENCOUNTER — Other Ambulatory Visit: Payer: Self-pay

## 2022-04-02 ENCOUNTER — Other Ambulatory Visit: Payer: Medicaid Other

## 2022-04-02 DIAGNOSIS — E119 Type 2 diabetes mellitus without complications: Secondary | ICD-10-CM | POA: Diagnosis not present

## 2022-04-03 LAB — HEMOGLOBIN A1C
Est. average glucose Bld gHb Est-mCnc: 346 mg/dL
Hgb A1c MFr Bld: 13.7 % — ABNORMAL HIGH (ref 4.8–5.6)

## 2022-04-09 ENCOUNTER — Telehealth: Payer: Self-pay | Admitting: Cardiovascular Disease

## 2022-04-09 DIAGNOSIS — E782 Mixed hyperlipidemia: Secondary | ICD-10-CM

## 2022-04-09 NOTE — Telephone Encounter (Signed)
Patient is requesting a lab order be placed for a lipid panel and would like a callback once it is placed to schedule. Please advise.

## 2022-04-09 NOTE — Telephone Encounter (Signed)
Called and left message for patient to call back for scheduling labs. Lipids and LFT order placed at this time.

## 2022-04-09 NOTE — Addendum Note (Signed)
Addended by: Lars Mage on: 04/09/2022 02:59 PM   Modules accepted: Orders

## 2022-04-13 NOTE — Telephone Encounter (Signed)
Called and spoke with patient who has changed his mind on the labs. He has an appt with Dr Rennis Golden at lipid clinic and will persue labs with him.
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
## 2022-05-10 ENCOUNTER — Encounter: Payer: Self-pay | Admitting: Nurse Practitioner

## 2022-05-10 ENCOUNTER — Ambulatory Visit: Payer: Medicaid Other | Attending: Nurse Practitioner | Admitting: Nurse Practitioner

## 2022-05-10 VITALS — BP 106/60 | HR 97 | Ht 72.0 in | Wt 251.0 lb

## 2022-05-10 DIAGNOSIS — I2510 Atherosclerotic heart disease of native coronary artery without angina pectoris: Secondary | ICD-10-CM

## 2022-05-10 DIAGNOSIS — Z79899 Other long term (current) drug therapy: Secondary | ICD-10-CM

## 2022-05-10 DIAGNOSIS — I10 Essential (primary) hypertension: Secondary | ICD-10-CM

## 2022-05-10 DIAGNOSIS — Z01818 Encounter for other preprocedural examination: Secondary | ICD-10-CM

## 2022-05-10 DIAGNOSIS — I5022 Chronic systolic (congestive) heart failure: Secondary | ICD-10-CM

## 2022-05-10 MED ORDER — ENTRESTO 24-26 MG PO TABS: 1.0000 | ORAL_TABLET | Freq: Two times a day (BID) | ORAL | 3 refills | Status: DC

## 2022-05-10 NOTE — Patient Instructions (Signed)
Medication Instructions:   Your physician recommends that you continue on your current medications as directed. Please refer to the Current Medication list given to you today.   *If you need a refill on your cardiac medications before your next appointment, please call your pharmacy*   Lab Work:  TODAY!!! BMET  If you have labs (blood work) drawn today and your tests are completely normal, you will receive your results only by: MyChart Message (if you have MyChart) OR A paper copy in the mail If you have any lab test that is abnormal or we need to change your treatment, we will call you to review the results.   Testing/Procedures:  Your physician has requested that you have an echocardiogram. Echocardiography is a painless test that uses sound waves to create images of your heart. It provides your doctor with information about the size and shape of your heart and how well your heart's chambers and valves are working. This procedure takes approximately one hour. There are no restrictions for this procedure.    Follow-Up: At [facility], you and your health needs are our priority.  As part of our continuing mission to provide you with exceptional heart care, we have created designated Provider Care Teams.  These Care Teams include your primary Cardiologist (physician) and Advanced Practice Providers (APPs -  Physician Assistants and Nurse Practitioners) who all work together to provide you with the care you need, when you need it.  We recommend signing up for the patient portal called "MyChart".  Sign up information is provided on this After Visit Summary.  MyChart is used to connect with patients for Virtual Visits (Telemedicine).  Patients are able to view lab/test results, encounter notes, upcoming appointments, etc.  Non-urgent messages can be sent to your provider as well.   To learn more about what you can do with MyChart, go to [email or web address].    Your next  appointment:   6 month(s)  The format for your next appointment:   In Person  Provider:   Tonny Bollman, MD     Other Instructions  Start TODAY and HOLD Asprin for 7 days and Start SUNDAY and hold Plavix for 5 days.    Important Information About Sugar

## 2022-05-11 LAB — BASIC METABOLIC PANEL
BUN/Creatinine Ratio: 11 (ref 9–20)
BUN: 10 mg/dL (ref 6–24)
CO2: 22 mmol/L (ref 20–29)
Calcium: 9.1 mg/dL (ref 8.7–10.2)
Chloride: 100 mmol/L (ref 96–106)
Creatinine, Ser: 0.9 mg/dL (ref 0.76–1.27)
Glucose: 449 mg/dL — ABNORMAL HIGH (ref 70–99)
Potassium: 4.2 mmol/L (ref 3.5–5.2)
Sodium: 139 mmol/L (ref 134–144)
eGFR: 103 mL/min/{1.73_m2} (ref >59)

## 2022-05-11 NOTE — Telephone Encounter (Signed)
Allen Spar, RN  You 18 hours ago (4:04 PM)    Allen Cox! I saw this patient inquired about labs and he sees [facility] for a new lipid appt end of this month. His last lipids that I see were 07/2021. Would be wise to repeat before he sees Hilty. Can Dr. Excell Seltzer order and NMR and LPa for him to have done, LFT is fine too -- saw you had put that order in? Otherwise, we will just need to order labs at his visit and then plan for therapy.    Called and spoke with patient who understands and agrees to come in to office prior to [facility] appt. Above labs entered and all have been scheduled for 05/15/22.

## 2022-05-11 NOTE — Addendum Note (Signed)
Addended by: Lars Mage on: 05/11/2022 10:36 AM   Modules accepted: Orders

## 2022-05-15 ENCOUNTER — Ambulatory Visit: Payer: Medicaid Other | Attending: Cardiovascular Disease

## 2022-05-15 ENCOUNTER — Telehealth: Payer: Self-pay

## 2022-05-15 DIAGNOSIS — E782 Mixed hyperlipidemia: Secondary | ICD-10-CM

## 2022-05-15 NOTE — Telephone Encounter (Signed)
**Note De-Identified Shifra Swartzentruber Obfuscation** The pt left his completed [employer] pt asst application for Entresto asst that Robin Searing, NP had already signed and dated. I have completed the providers page of his application and have faxed all to [employer] pt asst [employer].  The pt was given 2 weeks of Entresto 24-26 mg samples while at the office this morning.

## 2022-05-15 NOTE — Telephone Encounter (Signed)
**Note De-Identified [name] Obfuscation** This message was sent Gabbriella Presswood FAXCOM, a product from [employer]. http://www.biscom.com/  [employer] pioneered [employer] and continues to innovate the most advanced and intelligent fax and secure messaging solutions for enterprises. www.biscom.com                     -------Fax Transmission Report-------  To:               Recipient at [phone] Subject:          Fw: Entresto Asst Result:           The transmission was successful. Explanation:      All Pages Ok Pages Sent:       4 Connect Time:     3 minutes, 41 seconds Transmit Time:    05/15/2022 09:31 Transfer Rate:    14400 Status Code:      0000 Retry Count:      0 Job Id:           [id number] Unique Id:        [id number] Fax Line:         16 Fax Server:       [employer]

## 2022-05-17 LAB — NMR, LIPOPROFILE
Cholesterol, Total: 192 mg/dL (ref 100–199)
HDL Particle Number: 19.2 umol/L — ABNORMAL LOW (ref >=30.5)
HDL-C: 37 mg/dL — ABNORMAL LOW (ref >39)
LDL Particle Number: 1812 nmol/L — ABNORMAL HIGH (ref <1000)
LDL Size: 21.2 nm (ref >20.5)
LDL-C (NIH Calc): 136 mg/dL — ABNORMAL HIGH (ref 0–99)
LP-IR Score: 65 — ABNORMAL HIGH (ref <=45)
Small LDL Particle Number: 515 nmol/L (ref <=527)
Triglycerides: 106 mg/dL (ref 0–149)

## 2022-05-17 LAB — LIPID PANEL
Chol/HDL Ratio: 5.3 ratio — ABNORMAL HIGH (ref 0.0–5.0)
Cholesterol, Total: 205 mg/dL — ABNORMAL HIGH (ref 100–199)
HDL: 39 mg/dL — ABNORMAL LOW (ref >39)
LDL Chol Calc (NIH): 148 mg/dL — ABNORMAL HIGH (ref 0–99)
Triglycerides: 100 mg/dL (ref 0–149)
VLDL Cholesterol Cal: 18 mg/dL (ref 5–40)

## 2022-05-17 LAB — HEPATIC FUNCTION PANEL
ALT: 35 IU/L (ref 0–44)
AST: 16 IU/L (ref 0–40)
Albumin: 4.5 g/dL (ref 3.8–4.9)
Alkaline Phosphatase: 173 IU/L — ABNORMAL HIGH (ref 44–121)
Bilirubin Total: 0.2 mg/dL (ref 0.0–1.2)
Bilirubin, Direct: 0.11 mg/dL (ref 0.00–0.40)
Total Protein: 7.2 g/dL (ref 6.0–8.5)

## 2022-05-17 LAB — LIPOPROTEIN A (LPA): Lipoprotein (a): 226.2 nmol/L — ABNORMAL HIGH (ref <75.0)

## 2022-05-29 ENCOUNTER — Ambulatory Visit (HOSPITAL_BASED_OUTPATIENT_CLINIC_OR_DEPARTMENT_OTHER): Payer: Medicaid Other | Admitting: Internal Medicine

## 2022-05-30 ENCOUNTER — Other Ambulatory Visit: Payer: Self-pay | Admitting: Cardiovascular Disease

## 2022-05-31 LAB — LIPID PANEL
Chol/HDL Ratio: 4.9 ratio (ref 0.0–5.0)
Cholesterol, Total: 188 mg/dL (ref 100–199)
HDL: 38 mg/dL — ABNORMAL LOW (ref >39)
LDL Chol Calc (NIH): 138 mg/dL — ABNORMAL HIGH (ref 0–99)
Triglycerides: 66 mg/dL (ref 0–149)
VLDL Cholesterol Cal: 12 mg/dL (ref 5–40)

## 2022-06-07 ENCOUNTER — Encounter (HOSPITAL_COMMUNITY): Payer: Self-pay

## 2022-06-07 ENCOUNTER — Ambulatory Visit (HOSPITAL_COMMUNITY): Payer: Medicaid Other | Attending: Internal Medicine

## 2022-06-11 ENCOUNTER — Encounter (HOSPITAL_COMMUNITY): Payer: Self-pay | Admitting: Nurse Practitioner

## 2022-06-15 ENCOUNTER — Telehealth: Payer: Self-pay | Admitting: Cardiovascular Disease

## 2022-06-15 NOTE — Telephone Encounter (Signed)
Pt c/o medication issue:  1. Name of Medication: sacubitril-valsartan (ENTRESTO) 24-26 MG  2. How are you currently taking this medication (dosage and times per day)? Take 1 tablet by mouth 2 (two) times daily. - Oral  3. Are you having a reaction (difficulty breathing--STAT)?   4. What is your medication issue? Pt calling for an update on patient assistance by [employer] and receiving this medication. He states he brought his approval letter up to the office already. Please advise

## 2022-06-18 NOTE — Telephone Encounter (Signed)
**Note De-Identified Allen Cox Obfuscation** I have called the pt back at Phone: [phone] [facility] and Cell phone number) [phone] but got no answer each time and cannot leave a message as the recording is stating "This call cannot be completed as dialed".  Pt has no MYCHART so I will continue to try and reach him.

## 2022-06-19 NOTE — Telephone Encounter (Signed)
**Note De-Identified Erik Nessel Obfuscation** No answer so I left a message on the pts VM advising him that he received an approval letter from [employer] pt asst [employer] for [place] assistance then he needs to contact them at [phone] to be set up with their pharmacy and his Delene Loll will then be shipped to him for them.  I also advised in the message that if this is not what he needs to please call Jeani Hawking back ar [facility] at Dr York Cerise office at [phone].

## 2022-06-19 NOTE — Telephone Encounter (Signed)
**Note De-Identified Fin Hupp Obfuscation** Per [employer] pt asst [employer], a MD must sign the providers page of the pts application and the RX.  I have completed another providers page of a [employer] pt asst application and have e-mailed it to Dr Burt Knack nurse so she can print a Entresto 24-26 mg RX for #180 with 3 refills, have Dr Burt Knack sign and date both, and to then fax to [employer] PAF at the number written on the cover letter attached.  I called the pt and he states that he brought his [employer] PAF approval letter for Delene Loll to the office that stated the providers page and a Delene Loll RX signed by the MD was needed.  I did not receive his letter or a message that he came to the office with this request.  He is aware that we are leaving him 2 weeks of Entresto 24-26 mg samples in the front office for him to pick up as he states that he is almost out.  He thanked me for our assistance.

## 2022-06-19 NOTE — Telephone Encounter (Signed)
Patient is returning call.  °

## 2022-06-20 ENCOUNTER — Other Ambulatory Visit: Payer: Self-pay | Admitting: Cardiovascular Disease

## 2022-06-20 MED ORDER — ENTRESTO 24-26 MG PO TABS: 1.0000 | ORAL_TABLET | Freq: Two times a day (BID) | ORAL | 3 refills | Status: DC

## 2022-06-20 NOTE — Telephone Encounter (Signed)
Completed and faxed, confirmation received.

## 2022-06-20 NOTE — Progress Notes (Signed)
Per Jeani Hawking Via, Fax rx to [employer] for [employer] 24/26 #180w/3RF along with patient assistance application.

## 2022-09-26 ENCOUNTER — Other Ambulatory Visit: Payer: Self-pay | Admitting: Internal Medicine

## 2022-10-16 ENCOUNTER — Other Ambulatory Visit: Payer: Self-pay

## 2022-10-16 NOTE — Telephone Encounter (Signed)
Allen Cox, [name]. Pt is requesting a refill on his entresto, but do not know what pharmacy to send it too. Could you please address this and pt would like a call back for confirmation. Thanks

## 2022-10-17 ENCOUNTER — Other Ambulatory Visit: Payer: Self-pay | Admitting: Cardiovascular Disease

## 2022-10-17 MED ORDER — ENTRESTO 24-26 MG PO TABS: 1.0000 | ORAL_TABLET | Freq: Two times a day (BID) | ORAL | 3 refills | Status: DC

## 2022-10-17 NOTE — Telephone Encounter (Signed)
**Note De-Identified Maimouna Rondeau Obfuscation** Willaim Sheng, [place]   10/16/22  4:34 PM Note Jeani Hawking, LPN. Pt is requesting a refill on his entresto, but do not know what pharmacy to send it too. Could you please address this and pt would like a call back for confirmation. Thanks

## 2022-10-17 NOTE — Telephone Encounter (Signed)
Did new rx, sent all with Dr Elmarie Shiley signature on RX & application today. Confirmation received.

## 2022-10-17 NOTE — Telephone Encounter (Signed)
**Note De-Identified Cipriano Millikan Obfuscation** October 17, 2022 Copied from Refill note from 10/16/22: Me     10/17/22  9:20 AM Note Per Crystal at [employer], they did not receive the providers page or the [facility] RX that we faxed to them on 06/20/2022.   I have completed another providers page and have e-mailed it to Dr York Cerise nurse so she can print a Entresto 24-26 mg RX for #180 with 3 refills, obtain Dr Elmarie Shiley signature and date on it and the RX in Dr York Cerise absence, and to then fax to [employer] at the fax number written on the cover letter included.   I called the pt to discuss but got no answer so I left a detailed message on his VM apologizing for the delay in getting his Delene Loll and advising him that we are faxing another providers page and RX to [employer] today. I did leave my name and office phone number so he can call me back if he has any questions or concerns.

## 2022-10-17 NOTE — Progress Notes (Signed)
Per Jeani Hawking Via, patients [employer] application was not received last year and he has to start the application process again. Application must accompany an RX. Prescription entered with [place], DOD to sign and send with providers page.

## 2022-10-17 NOTE — Telephone Encounter (Signed)
**Note De-Identified [name] Obfuscation** Per Crystal at [facility], they did not receive the providers page or the [facility] RX that we faxed to them on 06/20/2022.  I have completed another providers page and have e-mailed it to Dr York Cerise nurse so she can print a Entresto 24-26 mg RX for #180 with 3 refills, obtain Dr Elmarie Shiley signature and date on it and the RX in Dr York Cerise absence, and to then fax to [employer] at the fax number written on the cover letter included.  I called the pt to discuss but got no answer so I left a detailed message on his VM apologizing for the delay in getting his Allen Cox and advising him that we are faxing another providers page and RX to [facility] today. I did leave my name and office phone number so he can call me back if he has any questions or concerns.

## 2022-10-22 NOTE — Telephone Encounter (Signed)
Pulled copies faxed (along with RX ) and re-faxed again today. Confirmation received.

## 2022-10-25 ENCOUNTER — Other Ambulatory Visit: Payer: Self-pay | Admitting: Internal Medicine

## 2022-10-27 ENCOUNTER — Other Ambulatory Visit: Payer: Self-pay | Admitting: Internal Medicine

## 2022-11-19 ENCOUNTER — Ambulatory Visit: Payer: Medicare Other | Attending: Cardiovascular Disease | Admitting: Cardiovascular Disease

## 2022-11-19 ENCOUNTER — Other Ambulatory Visit: Payer: Medicare Other

## 2022-11-19 ENCOUNTER — Encounter: Payer: Self-pay | Admitting: Cardiovascular Disease

## 2022-11-19 VITALS — BP 130/70 | HR 65 | Ht 72.0 in | Wt 248.8 lb

## 2022-11-19 DIAGNOSIS — I10 Essential (primary) hypertension: Secondary | ICD-10-CM | POA: Diagnosis not present

## 2022-11-19 DIAGNOSIS — I5022 Chronic systolic (congestive) heart failure: Secondary | ICD-10-CM | POA: Insufficient documentation

## 2022-11-19 DIAGNOSIS — E782 Mixed hyperlipidemia: Secondary | ICD-10-CM | POA: Diagnosis not present

## 2022-11-19 DIAGNOSIS — E119 Type 2 diabetes mellitus without complications: Secondary | ICD-10-CM | POA: Insufficient documentation

## 2022-11-19 DIAGNOSIS — Z951 Presence of aortocoronary bypass graft: Secondary | ICD-10-CM | POA: Diagnosis not present

## 2022-11-19 MED ORDER — ENTRESTO 49-51 MG PO TABS: 1.0000 | ORAL_TABLET | Freq: Two times a day (BID) | ORAL | 3 refills | Status: DC

## 2022-11-19 MED ORDER — EMPAGLIFLOZIN 10 MG PO TABS: 10.0000 mg | ORAL_TABLET | Freq: Every day | ORAL | 0 refills | Status: DC

## 2022-11-19 MED ORDER — EZETIMIBE 10 MG PO TABS: 10.0000 mg | ORAL_TABLET | Freq: Every day | ORAL | 3 refills | Status: AC

## 2022-11-19 MED ORDER — CARVEDILOL 12.5 MG PO TABS: 12.5000 mg | ORAL_TABLET | Freq: Two times a day (BID) | ORAL | 3 refills | Status: AC

## 2022-11-19 MED ORDER — SPIRONOLACTONE 25 MG PO TABS: 12.5000 mg | ORAL_TABLET | Freq: Every day | ORAL | 3 refills | Status: DC

## 2022-11-19 MED ORDER — ATORVASTATIN CALCIUM 80 MG PO TABS: 80.0000 mg | ORAL_TABLET | Freq: Every day | ORAL | 3 refills | Status: AC

## 2022-11-19 MED ORDER — EMPAGLIFLOZIN 10 MG PO TABS: 10.0000 mg | ORAL_TABLET | Freq: Every day | ORAL | 11 refills | Status: DC

## 2022-11-19 MED ORDER — CLOPIDOGREL BISULFATE 75 MG PO TABS: 75.0000 mg | ORAL_TABLET | Freq: Every day | ORAL | 3 refills | Status: DC

## 2022-11-19 NOTE — Progress Notes (Signed)
Cardiology Office Note:    Date:  11/19/2022   ID:  Allen Horsfall., DOB 09/22/1970, MRN [id number]  PCP:  Mack Hook, [place] Providers Cardiologist:  Sherren Mocha, MD     Referring MD: Mack Hook, MD   Chief Complaint  Patient presents with   Coronary Artery Disease    History of Present Illness:    Allen L Reinold Fogleman. is a 52 y.o. male with a hx of: Coronary artery disease S/p NSTEMI >> CABG 2019 Postoperative ventricular tachycardia requiring defib/CPR Chronic systolic CHF Ischemic cardiomyopathy Cardiac MRI 9/19:  EF 34 Patient declined referral to EP for ICD Echo 01/2019: EF 40-45 Diabetes mellitus Hyperlipidemia  The patient is doing well. 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 He complains of intermittent shortness of breath, but nothing on a consistent basis. Today, he denies symptoms of palpitations, chest pain, orthopnea, PND, lower extremity edema, dizziness, or syncope.   Past Medical History:  Diagnosis Date   CAD (coronary artery disease) of bypass graft    DM (diabetes mellitus) ([place])    HLD (hyperlipidemia)    Ischemic cardiomyopathy    Primary hypertension 10/08/2021   Tobacco use    VT (ventricular tachycardia) ([place])     Past Surgical History:  Procedure Laterality Date   CORONARY ARTERY BYPASS GRAFT N/A 01/08/2018   Procedure: CORONARY ARTERY BYPASS GRAFTING (CABG) x five, using Left Internal Mammary Artery and Right Leg Greater Saphenous Vein harvested endoscopically;  Surgeon: Melrose Nakayama, MD;  Location: [place];  Service: Open Heart Surgery;  Laterality: N/A;   LEFT HEART CATH AND CORONARY ANGIOGRAPHY N/A 01/03/2018   Procedure: LEFT HEART CATH AND CORONARY ANGIOGRAPHY;  Surgeon: Jettie Booze, MD;  Location: [place] [facility];  Service: Cardiovascular;  Laterality: N/A;   TEE WITHOUT CARDIOVERSION N/A 01/08/2018   Procedure:  TRANSESOPHAGEAL ECHOCARDIOGRAM (TEE);  Surgeon: Melrose Nakayama, MD;  Location: [place];  Service: Open Heart Surgery;  Laterality: N/A;   WRIST ARTHROPLASTY Right     Current Medications: Current Meds  Medication Sig   aspirin EC 81 MG tablet Take 1 tablet (81 mg total) by mouth daily.   blood glucose meter kit and supplies KIT Dispense based on patient and insurance preference. Use up to four times daily as directed. (FOR ICD-9 250.00, 250.01).   citalopram (CELEXA) 20 MG tablet Take 1 tablet (20 mg total) by mouth daily.   empagliflozin (JARDIANCE) 10 MG TABS tablet Take 1 tablet (10 mg total) by mouth daily before breakfast.   empagliflozin (JARDIANCE) 10 MG TABS tablet Take 1 tablet (10 mg total) by mouth daily before breakfast.   gabapentin (NEURONTIN) 300 MG capsule TAKE 1 CAPSULE BY MOUTH ONCE DAILY ONE HOUR BEFORE BEDTIME   glipiZIDE (GLUCOTROL) 10 MG tablet Take 1 tablet (10 mg total) by mouth 2 (two) times daily before a meal.   glucose blood (TRUE METRIX BLOOD GLUCOSE TEST) test strip Check blood glucose twice daily before meals   Lancets MISC Check blood glucose twice daily before meals   meloxicam (MOBIC) 15 MG tablet Take 15 mg by mouth daily.   metFORMIN (GLUCOPHAGE) 1000 MG tablet Take 1 tablet (1,000 mg total) by mouth 2 (two) times daily with a meal.   sacubitril-valsartan (ENTRESTO) 49-51 MG Take 1 tablet by mouth 2 (two) times daily.   [DISCONTINUED] atorvastatin (LIPITOR) 80 MG tablet Take 1 tablet (80 mg total) by  mouth at bedtime.   [DISCONTINUED] carvedilol (COREG) 12.5 MG tablet Take 1 tablet (12.5 mg total) by mouth 2 (two) times daily.   [DISCONTINUED] clopidogrel (PLAVIX) 75 MG tablet Take 1 tablet (75 mg total) by mouth daily.   [DISCONTINUED] ezetimibe (ZETIA) 10 MG tablet Take 1 tablet (10 mg total) by mouth daily.   [DISCONTINUED] sacubitril-valsartan (ENTRESTO) 24-26 MG Take 1 tablet by mouth 2 (two) times daily.   [DISCONTINUED] spironolactone  (ALDACTONE) 25 MG tablet Take 0.5 tablets (12.5 mg total) by mouth daily.     Allergies:   Patient has no known allergies.   Social History   Socioeconomic History   Marital status: Single    Spouse name: Not on file   Number of children: Not on file   Years of education: Not on file   Highest education level: Not on file  Occupational History   Not on file  Tobacco Use   Smoking status: Former    Types: Cigars, Pipe    Quit date: 09/04/1987    Years since quitting: 35.2   Smokeless tobacco: Never  Vaping Use   Vaping Use: Never used  Substance and Sexual Activity   Alcohol use: Not Currently   Drug use: Never   Sexual activity: Not on file  Other Topics Concern   Not on file  Social History Narrative   Not on file   Social Determinants of Health   Financial Resource Strain: Not on file  Food Insecurity: No Food Insecurity (02/11/2018)   Hunger Vital Sign    Worried About Running Out of Food in the Last Year: Never true    Ran Out of Food in the Last Year: Never true  Transportation Needs: No Transportation Needs (02/11/2018)   PRAPARE - Hydrologist (Medical): No    Lack of Transportation (Non-Medical): No  Physical Activity: Not on file  Stress: No Stress Concern Present (02/11/2018)   [place]    Feeling of Stress : Not at all  Social Connections: Not on file     Family History: The patient's family history includes Stroke in his sister. There is no history of CAD.  ROS:   Please see the history of present illness.    All other systems reviewed and are negative.  EKGs/Labs/Other Studies Reviewed:    The following studies were reviewed today: Cardiac Studies & Procedures   CARDIAC CATHETERIZATION  CARDIAC CATHETERIZATION 01/03/2018  Narrative  Post Atrio lesion is 100% stenosed.  Dist RCA lesion is 25% stenosed.  Mid RCA lesion is 60% stenosed.  Prox RCA  lesion is 25% stenosed.  Prox Cx to Mid Cx lesion is 75% stenosed.  Ost 1st Mrg to 1st Mrg lesion is 75% stenosed.  Ost 2nd Mrg to 2nd Mrg lesion is 40% stenosed.  Prox LAD to Mid LAD lesion is 99% stenosed.  2nd Diag lesion is 60% stenosed.  There is moderate left ventricular systolic dysfunction.  The left ventricular ejection fraction is 25-35% by visual estimate.  LV end diastolic pressure is mildly elevated.  There is no aortic valve stenosis.  Severe three vessel disease detected in the setting of NSTEMI.  Likely uncontrolled diabetic.  Will obtain cardiac surgery consult.  Findings Coronary Findings Diagnostic  Dominance: Right  Left Anterior Descending Prox LAD to Mid LAD lesion is 99% stenosed. The lesion is heavily thrombotic.  Second Diagonal Branch Vessel is small in size. 2nd Diag lesion  is 60% stenosed.  Second Septal Branch Collaterals 2nd Sept filled by collaterals from RPDA.  Left Circumflex Prox Cx to Mid Cx lesion is 75% stenosed.  First Obtuse Marginal Branch Ost 1st Mrg to 1st Mrg lesion is 75% stenosed.  Second Obtuse Marginal Branch Ost 2nd Mrg to 2nd Mrg lesion is 40% stenosed.  Right Coronary Artery The vessel is ectatic. Prox RCA lesion is 25% stenosed. Mid RCA lesion is 60% stenosed. Dist RCA lesion is 25% stenosed.  Right Posterior Atrioventricular Artery Collaterals RPAV filled by collaterals from RPDA.  Post Atrio lesion is 100% stenosed.  Intervention  No interventions have been documented.     ECHOCARDIOGRAM  ECHOCARDIOGRAM COMPLETE 08/04/2021  Narrative ECHOCARDIOGRAM REPORT    Patient Name:   Allen Cox. Date of Exam: 08/04/2021 Medical Rec #:  [id number]           Height:       71.0 in Accession #:    [id number]          Weight:       256.2 lb Date of Birth:  1971-03-08           BSA:          2.343 m Patient Age:    78 years            BP:           106/68 mmHg Patient Gender: M                   HR:            85 bpm. Exam Location:  [place]  Procedure: 2D Echo, Cardiac Doppler, Color Doppler and Intracardiac Opacification Agent  Indications:    I25.5 Ischemic cardiomyopathy; Z01.818 Preoperative clearance  History:        Patient has prior history of Echocardiogram examinations, most recent 01/15/2019. CAD and Previous Myocardial Infarction, Prior CABG; Risk Factors:Dyslipidemia and Diabetes. Ventricular tachycardia.  Sonographer:    Diamond Nickel RCS Referring Phys: [place]   1. Left ventricular ejection fraction, by estimation, is 40%. The left ventricle has mild to moderately decreased function. The left ventricle demonstrates regional wall motion abnormalities (see scoring diagram/findings for description). Left ventricular diastolic parameters are consistent with Grade I diastolic dysfunction (impaired relaxation). 2. Right ventricular systolic function is normal. The right ventricular size is normal. Tricuspid regurgitation signal is inadequate for assessing PA pressure. 3. The mitral valve is normal in structure. Trivial mitral valve regurgitation. No evidence of mitral stenosis. 4. The aortic valve is tricuspid. Aortic valve regurgitation is not visualized. No aortic stenosis is present. 5. Aortic dilatation noted. There is mild dilatation of the aortic root, measuring 41 mm. 6. The inferior vena cava is normal in size with greater than 50% respiratory variability, suggesting right atrial pressure of 3 mmHg.  FINDINGS Left Ventricle: Left ventricular ejection fraction, by estimation, is 40%. The left ventricle has mild to moderately decreased function. The left ventricle demonstrates regional wall motion abnormalities. The left ventricular internal cavity size was normal in size. There is no left ventricular hypertrophy. Left ventricular diastolic parameters are consistent with Grade I diastolic dysfunction (impaired relaxation).   LV Wall  Scoring: The mid and distal anterior septum and apical inferior segment are akinetic. The mid and distal anterior wall, basal anteroseptal segment, apical lateral segment, and mid anterolateral segment are hypokinetic.  Right Ventricle: The right ventricular size is normal. No increase in  right ventricular wall thickness. Right ventricular systolic function is normal. Tricuspid regurgitation signal is inadequate for assessing PA pressure.  Left Atrium: Left atrial size was normal in size.  Right Atrium: Right atrial size was normal in size.  Pericardium: There is no evidence of pericardial effusion.  Mitral Valve: The mitral valve is normal in structure. Trivial mitral valve regurgitation. No evidence of mitral valve stenosis.  Tricuspid Valve: The tricuspid valve is normal in structure. Tricuspid valve regurgitation is trivial. No evidence of tricuspid stenosis.  Aortic Valve: The aortic valve is tricuspid. Aortic valve regurgitation is not visualized. No aortic stenosis is present.  Pulmonic Valve: The pulmonic valve was not well visualized. Pulmonic valve regurgitation is trivial. No evidence of pulmonic stenosis.  Aorta: Aortic dilatation noted. There is mild dilatation of the aortic root, measuring 41 mm.  Venous: The inferior vena cava is normal in size with greater than 50% respiratory variability, suggesting right atrial pressure of 3 mmHg.  IAS/Shunts: No atrial level shunt detected by color flow Doppler.   LEFT VENTRICLE PLAX 2D LVIDd:         5.80 cm   Diastology LVIDs:         4.70 cm   LV e' medial:    7.58 cm/s LV PW:         1.00 cm   LV E/e' medial:  5.0 LV IVS:        0.90 cm   LV e' lateral:   10.10 cm/s LVOT diam:     2.45 cm   LV E/e' lateral: 3.7 LV SV:         57 LV SV Index:   24 LVOT Area:     4.71 cm   RIGHT VENTRICLE RV Basal diam:  2.40 cm RV S prime:     5.70 cm/s TAPSE (M-mode): 1.3 cm  LEFT ATRIUM             Index        RIGHT ATRIUM            Index LA diam:        4.90 cm 2.09 cm/m   RA Area:     19.20 cm LA Vol (A2C):   52.2 ml 22.28 ml/m  RA Volume:   62.60 ml  26.72 ml/m LA Vol (A4C):   67.2 ml 28.69 ml/m LA Biplane Vol: 59.9 ml 25.57 ml/m AORTIC VALVE LVOT Vmax:   69.40 cm/s LVOT Vmean:  50.500 cm/s LVOT VTI:    0.121 m  AORTA Ao Root diam: 4.10 cm Ao Asc diam:  3.10 cm  MITRAL VALVE MV Area (PHT): 4.06 cm    SHUNTS MV Decel Time: 187 msec    Systemic VTI:  0.12 m MV E velocity: 37.70 cm/s  Systemic Diam: 2.45 cm MV A velocity: 63.40 cm/s MV E/A ratio:  0.59  Cherlynn Kaiser MD Electronically signed by Cherlynn Kaiser MD Signature Date/Time: 08/04/2021/9:00:37 PM    Final   TEE  ECHO TEE 01/08/2018  Interpretation Summary  Aortic valve: The valve is trileaflet. No stenosis. No regurgitation.  Mitral valve: Mild Regurgitation. Central jet.  Right ventricle: Normal cavity size, wall thickness and ejection fraction.  Tricuspid valve: Mild regurgitation. The tricuspid valve regurgitation jet is central.  Pericardium: Small circumferential pericardial effusion. Effusion is fluid.  Pulmonic Valve: Poorly visualized, no insufficiency noted.  Left ventricle: Increased thickening of the wall of the ventricle. Diffuse hypokinesis. Akinesis of the inferior wall. EF 35-40%.  Post  Bypass:  Tricuspid, Pulmonic and Aortic valve unchanged. Mild to moderated MR with regurgitant flow directed anteriorly. LVEF 30% (CO 5.2) but improving with vasopressor support, likely stunned from VF episode after initial separation from CPB. Anterior and lateral wall segments display improved contractility. Aortic Balloon Pump in proper position with no AI present. RV function improving with milrinone and Epi infusion. No aortic dissection noted after aortic cannula removal.     CARDIAC MRI  MR CARDIAC MORPHOLOGY W WO CONTRAST 05/18/2018  Narrative CLINICAL DATA:  52 year old man post NSTEMI, three-vessel disease with  impaired left ventricular function with ejection fraction of 25 to 35%, S/P CABG x 5 on 01/08/2018 and VF arrest. Assess LVEF 3 months post CABG.  EXAM: CARDIAC MRI  TECHNIQUE: The patient was scanned on a 1.5 Tesla GE magnet. A dedicated cardiac coil was used. Functional imaging was done using Fiesta sequences. 2,3, and 4 chamber views were done to assess for RWMA's. Modified Simpson's rule using a short axis stack was used to calculate an ejection fraction on a dedicated work Conservation officer, nature. The patient received 12 cc of Gadovist. After 10 minutes inversion recovery sequences were used to assess for infiltration and scar tissue.  CONTRAST:  12 cc of Gadovist  FINDINGS: 1. Mildly dilated left ventricle with normal wall thickness and severely decreased systolic function (LVEF = 34%). There is hypokinesis in the mid anteroseptal, anterior and apical septal, anterior walls and in the true apex.  There is late gadolinium enhancement in the following segments: Mid anterior and anteroseptal walls (25-50%), apical septal and anterior walls (50-75%) and true apex (75-100%).  LVEDD: 60 mm  LVESD: 50 mm  LVEDV: 221 ml  LVESV: 147 ml  SV: 74 ml  Myocardial mass: 162 g  2. Normal right ventricular size, thickness and systolic function (LVEF = 48%). There are no regional wall motion abnormalities.  3.  Normal left and right atrial size.  4. Normal size of the aortic root, ascending aorta and pulmonary artery.  5.  No significant valvular abnormalities.  6.  Normal pericardium.  No pericardial effusion.  IMPRESSION: 1. Mildly dilated left ventricle with normal wall thickness and severely decreased systolic function (LVEF = 34%). There is hypokinesis in the mid anteroseptal, anterior and apical septal, anterior walls and in the true apex.  There is late gadolinium enhancement in the following segments: Mid anterior and anteroseptal walls (25-50%), apical  septal and anterior walls (50-75%) and true apex (75-100%).  2. Normal right ventricular size, thickness and systolic function (LVEF = 48%). There are no regional wall motion abnormalities.  3.  Normal left and right atrial size.  4. Normal size of the aortic root, ascending aorta and pulmonary artery.  No improvement of LVEF since the last echocardiogram on 02/13/2018, an ICD implantation is recommended.  Ena Dawley   Electronically Signed By: Ena Dawley On: 05/18/2018 22:29          EKG:  EKG is not ordered today.    Recent Labs: 05/10/2022: BUN 10; Creatinine, Ser 0.90; Potassium 4.2; Sodium 139 05/15/2022: ALT 35  Recent Lipid Panel    Component Value Date/Time   CHOL 188 05/30/2022 1014   TRIG 66 05/30/2022 1014   HDL 38 (L) 05/30/2022 1014   CHOLHDL 4.9 05/30/2022 1014   CHOLHDL 5.6 01/04/2018 0433   VLDL 19 01/04/2018 0433   LDLCALC 138 (H) 05/30/2022 1014     Risk Assessment/Calculations:  Physical Exam:    VS:  BP 130/70   Pulse 65   Ht 6' (1.829 m)   Wt 248 lb 12.8 oz (112.9 kg)   SpO2 95%   BMI 33.74 kg/m     Wt Readings from Last 3 Encounters:  11/19/22 248 lb 12.8 oz (112.9 kg)  05/10/22 251 lb (113.9 kg)  10/04/21 249 lb 3.2 oz (113 kg)     GEN:  Well nourished, well developed in no acute distress HEENT: Normal NECK: No JVD; No carotid bruits LYMPHATICS: No lymphadenopathy CARDIAC: RRR, no murmurs, rubs, gallops RESPIRATORY:  Clear to auscultation without rales, wheezing or rhonchi  ABDOMEN: Soft, non-tender, non-distended MUSCULOSKELETAL:  No edema; No deformity  SKIN: Warm and dry NEUROLOGIC:  Alert and oriented x 3 PSYCHIATRIC:  Normal affect   ASSESSMENT:    1. Chronic systolic CHF (congestive heart failure) ([place])   2. S/P CABG x 5   3. Essential hypertension   4. Mixed hyperlipidemia   5. Diabetes mellitus type 2 in nonobese [facility])    PLAN:    In order of problems listed above:  Last  assessment of LVEF in December 2022 at 40%.  Currently on low-dose Entresto, spironolactone, and carvedilol.  Patient appears euvolemic on exam.  I recommended increasing his Entresto to 49/51 mg twice daily and adding Jardiance 10 mg daily.  He otherwise will continue on his current medical regimen.  Will update an echocardiogram to reassess LVEF.  Follow-up in 6 months with APP. Appears stable with no anginal symptoms.  Treated with aspirin and high intensity statin drug. Blood pressure well-controlled.  Increase Entresto today.  Continue carvedilol and spironolactone at current doses. Last LDL cholesterol was 138 mg/dL.  Will update his labs today.  He is treated with Zetia and atorvastatin.  May need to intensify his lipid-lowering regimen if his lipids are not significantly better.  His goal LDL cholesterol is less than 55 mg/dL. Check hemoglobin A1c at his request.  Treated with metformin and glipizide.  Add Jardiance.  Followed by his PCP.           Medication Adjustments/Labs and Tests Ordered: Current medicines are reviewed at length with the patient today.  Concerns regarding medicines are outlined above.  Orders Placed This Encounter  Procedures   Comprehensive metabolic panel   Lipid panel   Hemoglobin A1c   Meds ordered this encounter  Medications   atorvastatin (LIPITOR) 80 MG tablet    Sig: Take 1 tablet (80 mg total) by mouth at bedtime.    Dispense:  90 tablet    Refill:  3   carvedilol (COREG) 12.5 MG tablet    Sig: Take 1 tablet (12.5 mg total) by mouth 2 (two) times daily.    Dispense:  180 tablet    Refill:  3   clopidogrel (PLAVIX) 75 MG tablet    Sig: Take 1 tablet (75 mg total) by mouth daily.    Dispense:  90 tablet    Refill:  3   ezetimibe (ZETIA) 10 MG tablet    Sig: Take 1 tablet (10 mg total) by mouth daily.    Dispense:  90 tablet    Refill:  3   spironolactone (ALDACTONE) 25 MG tablet    Sig: Take 0.5 tablets (12.5 mg total) by mouth daily.     Dispense:  45 tablet    Refill:  3   sacubitril-valsartan (ENTRESTO) 49-51 MG    Sig: Take 1 tablet by mouth 2 (two)  times daily.    Dispense:  180 tablet    Refill:  3   empagliflozin (JARDIANCE) 10 MG TABS tablet    Sig: Take 1 tablet (10 mg total) by mouth daily before breakfast.    Dispense:  30 tablet    Refill:  11   empagliflozin (JARDIANCE) 10 MG TABS tablet    Sig: Take 1 tablet (10 mg total) by mouth daily before breakfast.    Dispense:  28 tablet    Refill:  0    Order Specific Question:   Lot Number?    Answer:   [id number]    Order Specific Question:   Expiration Date?    Answer:   09/28/2022    Order Specific Question:   Quantity    Answer:   28    Patient Instructions  Medication Instructions:  INCREASE Entresto to 49/51mg  twice daily START Jardiance 10mg  daily *If you need a refill on your cardiac medications before your next appointment, please call your pharmacy*   Lab Work: CMET, Lipids, A1C today If you have labs (blood work) drawn today and your tests are completely normal, you will receive your results only by: [place] (if you have MyChart) OR A paper copy in the mail If you have any lab test that is abnormal or we need to change your treatment, we will call you to review the results.   Testing/Procedures: ECHO Your physician has requested that you have an echocardiogram. Echocardiography is a painless test that uses sound waves to create images of your heart. It provides your doctor with information about the size and shape of your heart and how well your heart's chambers and valves are working. This procedure takes approximately one hour. There are no restrictions for this procedure. Please do NOT wear cologne, perfume, aftershave, or lotions (deodorant is allowed). Please arrive 15 minutes prior to your appointment time.  Follow-Up: At [facility], you and your health needs are our priority.  As part of our continuing mission to  provide you with exceptional heart care, we have created designated Provider Care Teams.  These Care Teams include your primary Cardiologist (physician) and Advanced Practice Providers (APPs -  Physician Assistants and Nurse Practitioners) who all work together to provide you with the care you need, when you need it.  We recommend signing up for the patient portal called "MyChart".  Sign up information is provided on this After Visit Summary.  MyChart is used to connect with patients for Virtual Visits (Telemedicine).  Patients are able to view lab/test results, encounter notes, upcoming appointments, etc.  Non-urgent messages can be sent to your provider as well.   To learn more about what you can do with MyChart, go to [email or web address].    Your next appointment:   6 month(s)  Provider:   Ambrose Pancoast, NP, Christen Bame, NP, or Richardson Dopp, PA-C   Then, Sherren Mocha, MD will plan to see you again in 1 year(s).      Signed, Sherren Mocha, MD  11/19/2022 5:42 PM    [place]

## 2022-11-19 NOTE — Patient Instructions (Signed)
Medication Instructions:  INCREASE Entresto to 49/51mg  twice daily START Jardiance 10mg  daily *If you need a refill on your cardiac medications before your next appointment, please call your pharmacy*   Lab Work: CMET, Lipids, A1C today If you have labs (blood work) drawn today and your tests are completely normal, you will receive your results only by: [place] (if you have MyChart) OR A paper copy in the mail If you have any lab test that is abnormal or we need to change your treatment, we will call you to review the results.   Testing/Procedures: ECHO Your physician has requested that you have an echocardiogram. Echocardiography is a painless test that uses sound waves to create images of your heart. It provides your doctor with information about the size and shape of your heart and how well your heart's chambers and valves are working. This procedure takes approximately one hour. There are no restrictions for this procedure. Please do NOT wear cologne, perfume, aftershave, or lotions (deodorant is allowed). Please arrive 15 minutes prior to your appointment time.  Follow-Up: At [facility], you and your health needs are our priority.  As part of our continuing mission to provide you with exceptional heart care, we have created designated Provider Care Teams.  These Care Teams include your primary Cardiologist (physician) and Advanced Practice Providers (APPs -  Physician Assistants and Nurse Practitioners) who all work together to provide you with the care you need, when you need it.  We recommend signing up for the patient portal called "MyChart".  Sign up information is provided on this After Visit Summary.  MyChart is used to connect with patients for Virtual Visits (Telemedicine).  Patients are able to view lab/test results, encounter notes, upcoming appointments, etc.  Non-urgent messages can be sent to your provider as well.   To learn more about what you can do  with MyChart, go to [email or web address].    Your next appointment:   6 month(s)  Provider:   Ambrose Pancoast, NP, Christen Bame, NP, or Richardson Dopp, PA-C   Then, Sherren Mocha, MD will plan to see you again in 1 year(s).

## 2022-11-20 LAB — LIPID PANEL
Chol/HDL Ratio: 5.5 ratio — ABNORMAL HIGH (ref 0.0–5.0)
Cholesterol, Total: 241 mg/dL — ABNORMAL HIGH (ref 100–199)
HDL: 44 mg/dL
LDL Chol Calc (NIH): 182 mg/dL — ABNORMAL HIGH (ref 0–99)
Triglycerides: 87 mg/dL (ref 0–149)
VLDL Cholesterol Cal: 15 mg/dL (ref 5–40)

## 2022-11-20 LAB — COMPREHENSIVE METABOLIC PANEL WITH GFR
ALT: 50 [IU]/L — ABNORMAL HIGH (ref 0–44)
AST: 23 [IU]/L (ref 0–40)
Albumin/Globulin Ratio: 1.5 (ref 1.2–2.2)
Albumin: 4.4 g/dL (ref 3.8–4.9)
Alkaline Phosphatase: 144 [IU]/L — ABNORMAL HIGH (ref 44–121)
BUN/Creatinine Ratio: 10 (ref 9–20)
BUN: 9 mg/dL (ref 6–24)
Bilirubin Total: 0.4 mg/dL (ref 0.0–1.2)
CO2: 22 mmol/L (ref 20–29)
Calcium: 9.4 mg/dL (ref 8.7–10.2)
Chloride: 98 mmol/L (ref 96–106)
Creatinine, Ser: 0.88 mg/dL (ref 0.76–1.27)
Globulin, Total: 3 g/dL (ref 1.5–4.5)
Glucose: 320 mg/dL — ABNORMAL HIGH (ref 70–99)
Potassium: 4.6 mmol/L (ref 3.5–5.2)
Sodium: 135 mmol/L (ref 134–144)
Total Protein: 7.4 g/dL (ref 6.0–8.5)
eGFR: 103 mL/min/{1.73_m2}

## 2022-11-20 LAB — HEMOGLOBIN A1C
Est. average glucose Bld gHb Est-mCnc: 341 mg/dL
Hgb A1c MFr Bld: 13.5 % — ABNORMAL HIGH (ref 4.8–5.6)

## 2022-11-30 ENCOUNTER — Other Ambulatory Visit (HOSPITAL_COMMUNITY): Payer: Self-pay | Admitting: Cardiovascular Disease

## 2022-11-30 DIAGNOSIS — I5043 Acute on chronic combined systolic (congestive) and diastolic (congestive) heart failure: Secondary | ICD-10-CM

## 2022-12-03 ENCOUNTER — Ambulatory Visit (HOSPITAL_COMMUNITY): Payer: Medicare Other | Attending: Cardiovascular Disease

## 2022-12-03 DIAGNOSIS — I5043 Acute on chronic combined systolic (congestive) and diastolic (congestive) heart failure: Secondary | ICD-10-CM | POA: Insufficient documentation

## 2022-12-03 LAB — ECHOCARDIOGRAM COMPLETE
Area-P 1/2: 2.69 cm2
Calc EF: 36.1 %
S' Lateral: 4.5 cm
Single Plane A2C EF: 36 %
Single Plane A4C EF: 36.5 %

## 2022-12-10 ENCOUNTER — Telehealth: Payer: Self-pay | Admitting: Cardiovascular Disease

## 2022-12-10 NOTE — Telephone Encounter (Signed)
Refills are at [facility], called and confirmed.  Called to make pt aware.

## 2022-12-10 NOTE — Telephone Encounter (Signed)
New message    *STAT* If patient is at the pharmacy, call can be transferred to refill team.   1. Which medications need to be refilled? (please list name of each medication and dose if known) atorvastatin, carvedilol, plavix, jardiance, zetia, entresto and aldactone  2. Which pharmacy/location (including street and city if local pharmacy) is medication to be sent to?[facility] at [address]  3. Do they need a 30 day or 90 day supply? 90 day if possible.  Medication was called in to pharmacy on 11-19-22 but pt did not pick up meds and pharmacy has removed them from the shelf.  Please call in meds again.

## 2023-01-01 ENCOUNTER — Other Ambulatory Visit (INDEPENDENT_AMBULATORY_CARE_PROVIDER_SITE_OTHER): Payer: Medicare Other

## 2023-01-01 DIAGNOSIS — E119 Type 2 diabetes mellitus without complications: Secondary | ICD-10-CM | POA: Diagnosis not present

## 2023-01-01 DIAGNOSIS — E782 Mixed hyperlipidemia: Secondary | ICD-10-CM

## 2023-01-02 LAB — LIPID PANEL W/O CHOL/HDL RATIO
Cholesterol, Total: 114 mg/dL (ref 100–199)
HDL: 33 mg/dL — ABNORMAL LOW (ref >39)
LDL Chol Calc (NIH): 66 mg/dL (ref 0–99)
Triglycerides: 69 mg/dL (ref 0–149)
VLDL Cholesterol Cal: 15 mg/dL (ref 5–40)

## 2023-01-02 LAB — HEMOGLOBIN A1C
Est. average glucose Bld gHb Est-mCnc: 329 mg/dL
Hgb A1c MFr Bld: 13.1 % — ABNORMAL HIGH (ref 4.8–5.6)

## 2023-01-02 LAB — HEPATIC FUNCTION PANEL
ALT: 35 IU/L (ref 0–44)
AST: 19 IU/L (ref 0–40)
Albumin: 4.4 g/dL (ref 3.8–4.9)
Alkaline Phosphatase: 116 IU/L (ref 44–121)
Bilirubin Total: 0.5 mg/dL (ref 0.0–1.2)
Bilirubin, Direct: 0.17 mg/dL (ref 0.00–0.40)
Total Protein: 7.2 g/dL (ref 6.0–8.5)

## 2023-01-07 ENCOUNTER — Ambulatory Visit: Payer: Medicare Other | Admitting: Internal Medicine

## 2023-01-21 ENCOUNTER — Encounter: Payer: Self-pay | Admitting: Internal Medicine

## 2023-01-21 ENCOUNTER — Ambulatory Visit (INDEPENDENT_AMBULATORY_CARE_PROVIDER_SITE_OTHER): Payer: Medicare Other | Admitting: Internal Medicine

## 2023-01-21 VITALS — BP 118/80 | HR 79 | Resp 16 | Ht 72.0 in | Wt 233.0 lb

## 2023-01-21 DIAGNOSIS — Z79899 Other long term (current) drug therapy: Secondary | ICD-10-CM

## 2023-01-21 DIAGNOSIS — G4700 Insomnia, unspecified: Secondary | ICD-10-CM | POA: Insufficient documentation

## 2023-01-21 DIAGNOSIS — E119 Type 2 diabetes mellitus without complications: Secondary | ICD-10-CM

## 2023-01-21 DIAGNOSIS — Z1211 Encounter for screening for malignant neoplasm of colon: Secondary | ICD-10-CM

## 2023-01-21 DIAGNOSIS — I255 Ischemic cardiomyopathy: Secondary | ICD-10-CM

## 2023-01-21 DIAGNOSIS — G4733 Obstructive sleep apnea (adult) (pediatric): Secondary | ICD-10-CM | POA: Insufficient documentation

## 2023-01-21 DIAGNOSIS — E782 Mixed hyperlipidemia: Secondary | ICD-10-CM

## 2023-01-21 DIAGNOSIS — R2689 Other abnormalities of gait and mobility: Secondary | ICD-10-CM | POA: Insufficient documentation

## 2023-01-21 DIAGNOSIS — E669 Obesity, unspecified: Secondary | ICD-10-CM | POA: Insufficient documentation

## 2023-01-21 MED ORDER — FAMOTIDINE 40 MG PO TABS: 40.0000 mg | ORAL_TABLET | Freq: Every day | ORAL | 6 refills | Status: AC

## 2023-01-21 NOTE — Patient Instructions (Signed)
Warm bath and reading before bed. If unable to get to sleep in 20 minutes, get out of bed, go somewhere calm and read until you are sleepy, then back to bed. If awaken and unable to get back to sleep in 20 minutes--read as above until sleepy. Physical activity with a friend every day--gradually work up to 1 hour.  Do not exercise close to bedtime. No napping  

## 2023-01-21 NOTE — Progress Notes (Signed)
Subjective:    Patient ID: Allen Riches., male   DOB: 1971/04/22, 52 y.o.   MRN: [id number]   HPI  Has not been seen here since 07/2021. Discussed recommended to be seen at least twice yearly.     DM:  Was out of meds until seen by cardiology in March and had meds filled.  States sugars running 89-97 fasting.  He does not check sugar any other time of day.  A1C about 20 days ago was 13.1%.  Discussed he needs to check sugars throughout day to get a better idea of how he is doing. He is having thirst and polyuria. States he is eating more vegetables.   Not eating beef as much, but more so lamb.    2.   Hyperlipidemia:  Close to goal for LDL at 66.  HDL a bit low at 33.    3.  Hypertension:  controlled  4.  CAD:  no chest pain  5.  Left hand fingertips and right hand fingers on and off with tingling.  Random.  Cannot say if has with particular activity.    6.  Cervical myelopathy:  following with [facility].  Would like to have anterior cervical decompression/fusion.  Discussed he is not optimized medically at this time with DM control.    7.  Belching:  has had since his CABG 2019.  Chain of belching.  Rarely with acid reflux.  Will have a series of belching before completes.  Can happen 4-6 times in a day.  Does not feel he has food sitting on his stomach for prolonged period of time.  No early satiety.   Rare nausea and vomiting.  Has never taken anything for this. Never on an acid reducer.   No tobacco or alcohol  8.  Insomnia;  Just feels his body does not enter rest.  Lies in bed and looks up at ceiling.  Not able to initiate sleep.  TV, no regular wake/sleep cycle.  9.  Lack of balance:  also since CABG.  Can lose balance out of the blue. Usually already standing. Does not seem to be falling to one side or there other.  Occurs2-3 times weekly.  The episode is just momentary--less than 60 seconds.    Current Meds  Medication Sig   aspirin EC 81 MG tablet Take 1  tablet (81 mg total) by mouth daily.   atorvastatin (LIPITOR) 80 MG tablet Take 1 tablet (80 mg total) by mouth at bedtime.   blood glucose meter kit and supplies KIT Dispense based on patient and insurance preference. Use up to four times daily as directed. (FOR ICD-9 250.00, 250.01).   carvedilol (COREG) 12.5 MG tablet Take 1 tablet (12.5 mg total) by mouth 2 (two) times daily.   citalopram (CELEXA) 20 MG tablet Take 1 tablet (20 mg total) by mouth daily.   clopidogrel (PLAVIX) 75 MG tablet Take 1 tablet (75 mg total) by mouth daily.   empagliflozin (JARDIANCE) 10 MG TABS tablet Take 1 tablet (10 mg total) by mouth daily before breakfast.   empagliflozin (JARDIANCE) 10 MG TABS tablet Take 1 tablet (10 mg total) by mouth daily before breakfast.   ezetimibe (ZETIA) 10 MG tablet Take 1 tablet (10 mg total) by mouth daily.   gabapentin (NEURONTIN) 300 MG capsule TAKE 1 CAPSULE BY MOUTH ONCE DAILY ONE HOUR BEFORE BEDTIME   glipiZIDE (GLUCOTROL) 10 MG tablet Take 1 tablet (10 mg total) by mouth 2 (  two) times daily before a meal.   glucose blood (TRUE METRIX BLOOD GLUCOSE TEST) test strip Check blood glucose twice daily before meals   Lancets MISC Check blood glucose twice daily before meals   metFORMIN (GLUCOPHAGE) 1000 MG tablet Take 1 tablet (1,000 mg total) by mouth 2 (two) times daily with a meal.   sacubitril-valsartan (ENTRESTO) 49-51 MG Take 1 tablet by mouth 2 (two) times daily.   spironolactone (ALDACTONE) 25 MG tablet Take 0.5 tablets (12.5 mg total) by mouth daily.   No Known Allergies   Review of Systems    Objective:   BP 118/80 (BP Location: Left Arm, Patient Position: Sitting, Cuff Size: Normal)   Pulse 79   Resp 16   Ht 6' (1.829 m)   Wt 233 lb (105.7 kg)   BMI 31.60 kg/m   Physical Exam NAD HEENT:  PERRL, EOMI, Discs sharp, TMs pearly gray, throat without injection Neck:  Supple, no adenopathy, no thyromegaly Chest:  CTA CV:  RRR with normal S1 and S2, No S3, S4 or  murmur.  No carotid bruits.  Carotid, radial and DP pulses normal and equal Abd:  S, NT, No HSM or mass, + BS LE:  No edema. Hand:  linear palmar thickening, right hand- no significant contracture Neuro:  Tinels negative bilaterally at volar wrists.  Not able to perform Phalens--cannot palmar flex at wrist on right hand. A & O x3, CN 2-12 grossly intact.  Motor 5/5 DTRs symmetric, but unable to elicit ankles and knees.  Gait stable.  Romberg negative.  No past pointing, rapid alt motions fine.   Assessment & Plan    Obesity/DM:  not controlled on combination of Glipizide/Metformin/Jardiance.  Check insulin level.  If in normal or high range, increase Jardiance.  If low, will start insulin.  To continue to work on diet and physical activity for control.  Also to check sugars more frequently throughout the day to get a better idea of what foods and activities are doing for his sugar levels.  TSH  2.  Dupuytrens contracture, right palm.  As per ortho  3.  Hypertension:  controlled.    4.  Hyperlipidemia:  Not quite at goal on high dose Atorvastatin/Zetia--to continue to work on lifestyle.  5.  CAD/ischemic cardiomyopathy:   compensated.  No symptoms.  6.  OSA:  referral for sleep study to get set back up with CPAP.  Lifestyle changes.  7.  Balance issues:  no obvious findings.  CBC, Vitamin B12, RBC folate  8.  Insomnia:  Discussed good sleep hygiene at length.  Handout.  9.  Hand tingling:  likely related to cervical stenosis and radiculopathy, but not clear if could have more peripheral issue as well--to pay attention to when he gets symptoms.  10.  GERD:  discussed GERD precautions.  Famotidine 40 mg at bedtime.    11.  HM:  referral for screening colonoscopy.  12.  Cervical stenosis:  needs surgery, but would like to get blood glucose under better control and patient just now back since last visit in 2022.  He should be seen at least twice yearly.

## 2023-01-22 LAB — COMPREHENSIVE METABOLIC PANEL
ALT: 38 IU/L (ref 0–44)
AST: 23 IU/L (ref 0–40)
BUN/Creatinine Ratio: 9 (ref 9–20)
CO2: 20 mmol/L (ref 20–29)
Calcium: 9.3 mg/dL (ref 8.7–10.2)
Creatinine, Ser: 0.77 mg/dL (ref 0.76–1.27)
Total Protein: 7.5 g/dL (ref 6.0–8.5)

## 2023-01-22 LAB — FOLATE RBC

## 2023-01-22 LAB — CBC WITH DIFFERENTIAL/PLATELET
Hemoglobin: 15.2 g/dL (ref 13.0–17.7)
Lymphocytes Absolute: 1.5 10*3/uL (ref 0.7–3.1)
Lymphs: 38 %
Monocytes Absolute: 0.5 10*3/uL (ref 0.1–0.9)
Monocytes: 12 %
Neutrophils Absolute: 1.9 10*3/uL (ref 1.4–7.0)
Neutrophils: 47 %

## 2023-01-22 LAB — SPECIMEN STATUS REPORT

## 2023-01-22 LAB — TSH: TSH: 1.44 u[IU]/mL (ref 0.450–4.500)

## 2023-01-22 LAB — INSULIN, RANDOM

## 2023-01-23 LAB — CBC WITH DIFFERENTIAL/PLATELET
Basophils Absolute: 0 10*3/uL (ref 0.0–0.2)
Basos: 1 %
EOS (ABSOLUTE): 0.1 10*3/uL (ref 0.0–0.4)
Eos: 2 %
Hematocrit: 47.2 % (ref 37.5–51.0)
Immature Grans (Abs): 0 10*3/uL (ref 0.0–0.1)
Immature Granulocytes: 0 %
MCH: 25.4 pg — ABNORMAL LOW (ref 26.6–33.0)
MCHC: 32.2 g/dL (ref 31.5–35.7)
MCV: 79 fL (ref 79–97)
Platelets: 262 10*3/uL (ref 150–450)
RBC: 5.98 x10E6/uL — ABNORMAL HIGH (ref 4.14–5.80)
RDW: 13.5 % (ref 11.6–15.4)
WBC: 4 10*3/uL (ref 3.4–10.8)

## 2023-01-23 LAB — COMPREHENSIVE METABOLIC PANEL
Albumin/Globulin Ratio: 1.5 (ref 1.2–2.2)
Albumin: 4.5 g/dL (ref 3.8–4.9)
Alkaline Phosphatase: 142 IU/L — ABNORMAL HIGH (ref 44–121)
BUN: 7 mg/dL (ref 6–24)
Bilirubin Total: 0.6 mg/dL (ref 0.0–1.2)
Chloride: 103 mmol/L (ref 96–106)
Globulin, Total: 3 g/dL (ref 1.5–4.5)
Glucose: 184 mg/dL — ABNORMAL HIGH (ref 70–99)
Potassium: 4.3 mmol/L (ref 3.5–5.2)
Sodium: 139 mmol/L (ref 134–144)
eGFR: 107 mL/min/{1.73_m2} (ref >59)

## 2023-01-23 LAB — FOLATE RBC
Folate, Hemolysate: 402 ng/mL
Hematocrit: 47.2 % (ref 37.5–51.0)

## 2023-01-23 LAB — VITAMIN B12: Vitamin B-12: 817 pg/mL (ref 232–1245)

## 2023-02-01 ENCOUNTER — Encounter: Payer: Self-pay | Admitting: Gastroenterology

## 2023-02-01 ENCOUNTER — Telehealth: Payer: Self-pay

## 2023-02-01 NOTE — Telephone Encounter (Signed)
Dr. Tomasa Rand,  Allen Cox is scheduled for a PV on Friday 02/08/23. He is scheduled for a direct colonoscopy on 02/28/23. In preparing his chart I saw that he is on Plavix. I called the patient to schedule an office visit per protocol. The earliest appointment with a PA was the end of Aug. The patient did not want to reschedule his procedure and was insisting that he could get clearance from his physician in writing. The patient states that he is having trouble getting surgical clearance due to a very high A-1C, as well as his cardiac clearance. Evidently he has an injured back and neck that requires surgery. I feel strongly that we need to follow protocol and have him seen in the office before ever sedating him. Due to his insisting I did say that I would ask you and call him on Monday. Please advise. Thanks!  Janalee Dane, LPN Pre-Visit

## 2023-02-05 NOTE — Telephone Encounter (Signed)
Pt made aware of apt

## 2023-02-05 NOTE — Telephone Encounter (Signed)
Bonita Quin,   I am just following up to see if you were able to get this patient scheduled for the OV Dr. Tomasa Rand offered? See attached messages. The patient is still scheduled for a PV I am going to cancel the visit.   Thank you, previsit

## 2023-02-05 NOTE — Telephone Encounter (Signed)
Pt has been scheduled to see Dr. Tomasa Rand 02/08/23 at 11:30am. Please let him know about the appt.

## 2023-02-06 MED ORDER — EMPAGLIFLOZIN 25 MG PO TABS: 25.0000 mg | ORAL_TABLET | Freq: Every day | ORAL | 11 refills | Status: AC

## 2023-02-06 NOTE — Addendum Note (Signed)
Addended by: Marcene Duos on: 02/06/2023 06:16 PM   Modules accepted: Orders

## 2023-02-08 ENCOUNTER — Encounter: Payer: Self-pay | Admitting: Gastroenterology

## 2023-02-08 ENCOUNTER — Ambulatory Visit (INDEPENDENT_AMBULATORY_CARE_PROVIDER_SITE_OTHER): Payer: Medicare Other | Admitting: Gastroenterology

## 2023-02-08 VITALS — BP 106/70 | HR 80 | Ht 71.0 in | Wt 240.1 lb

## 2023-02-08 DIAGNOSIS — Z1212 Encounter for screening for malignant neoplasm of rectum: Secondary | ICD-10-CM

## 2023-02-08 DIAGNOSIS — Z951 Presence of aortocoronary bypass graft: Secondary | ICD-10-CM

## 2023-02-08 DIAGNOSIS — Z7902 Long term (current) use of antithrombotics/antiplatelets: Secondary | ICD-10-CM

## 2023-02-08 DIAGNOSIS — I5022 Chronic systolic (congestive) heart failure: Secondary | ICD-10-CM

## 2023-02-08 DIAGNOSIS — Z1211 Encounter for screening for malignant neoplasm of colon: Secondary | ICD-10-CM | POA: Diagnosis not present

## 2023-02-08 MED ORDER — DULAGLUTIDE 0.75 MG/0.5ML ~~LOC~~ SOAJ: 0.7500 mg | SUBCUTANEOUS | 11 refills | Status: DC

## 2023-02-08 NOTE — Addendum Note (Signed)
Addended by: Marcene Duos on: 02/08/2023 09:00 AM   Modules accepted: Orders

## 2023-02-08 NOTE — Patient Instructions (Signed)
_______________________________________________________  If your blood pressure at your visit was 140/90 or greater, please contact your primary care physician to follow up on this.  _______________________________________________________  If you are age 53 or older, your body mass index should be between 23-30. Your Body mass index is 33.49 kg/m. If this is out of the aforementioned range listed, please consider follow up with your Primary Care Provider.  If you are age 90 or younger, your body mass index should be between 19-25. Your Body mass index is 33.49 kg/m. If this is out of the aformentioned range listed, please consider follow up with your Primary Care Provider.    We will call you to schedule screening colonoscopy at the hospital.  ________________________________________________________  The [facility] providers would like to encourage you to use [facility] to communicate with providers for non-urgent requests or questions.  Due to long hold times on the telephone, sending your provider a message by [facility] may be a faster and more efficient way to get a response.  Please allow 48 business hours for a response.  Please remember that this is for non-urgent requests.   It was a pleasure to see you today!  Thank you for trusting me with your gastrointestinal care!

## 2023-02-08 NOTE — Progress Notes (Signed)
HPI : Allen Cox is a very pleasant 52 year old male with a history of coronary artery disease status post CABG and ischemic cardiomyopathy who is referred to [place] by Dr. Julieanne Manson for consideration of screening colonoscopy.  He has never had a previous colonoscopy.  He has no family history of colon cancer. The patient denies any chronic GI symptoms to include abdominal pain, constipation, diarrhea or blood in his stool  He also denies any chronic upper GI symptoms to include frequent heartburn/acid regurgitation, nausea/vomiting, dysphagia, dyspepsia, or early satiety.  He underwent a 5 vessel bypass surgery in 2019.  He was noted to have low ejection fraction and was recommended for defibrillator placement, which he declined.  He did regain some systolic function, but his most recent echo in April of this year showed an EF of 35-40% He does report stable exertional dyspnea.  He would be unable to go up 2 flights of stairs without stopping.  He sometimes gets fatigued doing grocery shopping.  He denies symptoms of orthopnea or lower extremity edema.  He denies any symptoms of chest pain/chest pressure, lightheadedness/dizziness or palpitations.  He has been on aspirin and Plavix since his surgery in 2019.  He has never required a stent and has no known history of graft disease.  He has no known history of stroke or TIA.  Past Medical History:  Diagnosis Date   CAD (coronary artery disease) of bypass graft    DM (diabetes mellitus) (HCC)    HLD (hyperlipidemia)    Ischemic cardiomyopathy    Primary hypertension 10/08/2021   Tobacco use    VT (ventricular tachycardia) (HCC)      Past Surgical History:  Procedure Laterality Date   CORONARY ARTERY BYPASS GRAFT N/A 01/08/2018   Procedure: CORONARY ARTERY BYPASS GRAFTING (CABG) x five, using Left Internal Mammary Artery and Right Leg Greater Saphenous Vein harvested endoscopically;  Surgeon: Loreli Slot, MD;  Location: [facility]  OR;  Service: Open Heart Surgery;  Laterality: N/A;   LEFT HEART CATH AND CORONARY ANGIOGRAPHY N/A 01/03/2018   Procedure: LEFT HEART CATH AND CORONARY ANGIOGRAPHY;  Surgeon: Corky Crafts, MD;  Location: [facility] INVASIVE CV LAB;  Service: Cardiovascular;  Laterality: N/A;   TEE WITHOUT CARDIOVERSION N/A 01/08/2018   Procedure: TRANSESOPHAGEAL ECHOCARDIOGRAM (TEE);  Surgeon: Loreli Slot, MD;  Location: [facility] OR;  Service: Open Heart Surgery;  Laterality: N/A;   WRIST ARTHROPLASTY Right    Family History  Problem Relation Age of Onset   Stroke Sister    CAD Neg Hx    Social History   Tobacco Use   Smoking status: Former    Types: Cigars, Pipe    Quit date: 09/04/1987    Years since quitting: 35.4   Smokeless tobacco: Never  Vaping Use   Vaping Use: Never used  Substance Use Topics   Alcohol use: Not Currently   Drug use: Never   Current Outpatient Medications  Medication Sig Dispense Refill   aspirin EC 81 MG tablet Take 1 tablet (81 mg total) by mouth daily. 90 tablet 3   atorvastatin (LIPITOR) 80 MG tablet Take 1 tablet (80 mg total) by mouth at bedtime. 90 tablet 3   blood glucose meter kit and supplies KIT Dispense based on patient and insurance preference. Use up to four times daily as directed. (FOR ICD-9 250.00, 250.01). 1 each 1   carvedilol (COREG) 12.5 MG tablet Take 1 tablet (12.5 mg total) by mouth 2 (two) times daily. 180 tablet  3   citalopram (CELEXA) 20 MG tablet Take 1 tablet (20 mg total) by mouth daily. 30 tablet 11   clopidogrel (PLAVIX) 75 MG tablet Take 1 tablet (75 mg total) by mouth daily. 90 tablet 3   Dulaglutide 0.75 MG/0.5ML SOPN Inject 0.75 mg into the skin once a week. 2 mL 11   empagliflozin (JARDIANCE) 25 MG TABS tablet Take 1 tablet (25 mg total) by mouth daily before breakfast. 30 tablet 11   ezetimibe (ZETIA) 10 MG tablet Take 1 tablet (10 mg total) by mouth daily. 90 tablet 3   famotidine (PEPCID) 40 MG tablet Take 1 tablet (40 mg total) by  mouth at bedtime. 30 tablet 6   gabapentin (NEURONTIN) 300 MG capsule TAKE 1 CAPSULE BY MOUTH ONCE DAILY ONE HOUR BEFORE BEDTIME     glipiZIDE (GLUCOTROL) 10 MG tablet Take 1 tablet (10 mg total) by mouth 2 (two) times daily before a meal. 60 tablet 11   glucose blood (TRUE METRIX BLOOD GLUCOSE TEST) test strip Check blood glucose twice daily before meals 100 each 11   Lancets MISC Check blood glucose twice daily before meals 100 each 11   metFORMIN (GLUCOPHAGE) 1000 MG tablet Take 1 tablet (1,000 mg total) by mouth 2 (two) times daily with a meal. 60 tablet 11   sacubitril-valsartan (ENTRESTO) 49-51 MG Take 1 tablet by mouth 2 (two) times daily. 180 tablet 3   spironolactone (ALDACTONE) 25 MG tablet Take 0.5 tablets (12.5 mg total) by mouth daily. 45 tablet 3   No current facility-administered medications for this visit.   No Known Allergies   Review of Systems: All systems reviewed and negative except where noted in HPI.    No results found.  Physical Exam: BP 106/70 (BP Location: Left Arm, Patient Position: Sitting, Cuff Size: Normal)   Pulse 80   Ht 5\' 11"  (1.803 m)   Wt 240 lb 2 oz (108.9 kg)   BMI 33.49 kg/m  Constitutional: Pleasant,well-developed, African-American male in no acute distress. HEENT: Normocephalic and atraumatic. Conjunctivae are normal. No scleral icterus. Neck supple.  Cardiovascular: Normal rate, regular rhythm.  Pulmonary/chest: Effort normal and breath sounds normal. No wheezing, rales or rhonchi. Abdominal: Soft, nondistended, nontender. Bowel sounds active throughout. There are no masses palpable. No hepatomegaly. Extremities: no edema Neurological: Alert and oriented to person place and time. Skin: Skin is warm and dry. No rashes noted. Psychiatric: Normal mood and affect. Behavior is normal.  CBC    Component Value Date/Time   WBC 4.0 01/21/2023 1543   WBC 4.8 10/11/2020 1834   RBC 5.98 (H) 01/21/2023 1543   RBC 5.19 10/11/2020 1834   HGB  15.2 01/21/2023 1543   HCT 47.2 01/21/2023 1543   HCT 47.2 01/21/2023 1543   PLT 262 01/21/2023 1543   MCV 79 01/21/2023 1543   MCH 25.4 (L) 01/21/2023 1543   MCH 24.9 (L) 10/11/2020 1834   MCHC 32.2 01/21/2023 1543   MCHC 30.4 10/11/2020 1834   RDW 13.5 01/21/2023 1543   LYMPHSABS 1.5 01/21/2023 1543   EOSABS 0.1 01/21/2023 1543   BASOSABS 0.0 01/21/2023 1543    CMP     Component Value Date/Time   NA 139 01/21/2023 1543   K 4.3 01/21/2023 1543   CL 103 01/21/2023 1543   CO2 20 01/21/2023 1543   GLUCOSE 184 (H) 01/21/2023 1543   GLUCOSE 430 (H) 10/11/2020 1834   BUN 7 01/21/2023 1543   CREATININE 0.77 01/21/2023 1543   CALCIUM 9.3 01/21/2023  1543   PROT 7.5 01/21/2023 1543   ALBUMIN 4.5 01/21/2023 1543   AST 23 01/21/2023 1543   ALT 38 01/21/2023 1543   ALKPHOS 142 (H) 01/21/2023 1543   BILITOT 0.6 01/21/2023 1543   GFRNONAA >60 10/11/2020 1834   GFRAA 110 06/03/2019 1603       Latest Ref Rng & Units 01/21/2023    3:43 PM 04/20/2021   11:51 AM 10/11/2020    6:34 PM  CBC EXTENDED  WBC 3.4 - 10.8 x10E3/uL 4.0  3.8  4.8   RBC 4.14 - 5.80 x10E6/uL 5.98  5.78  5.19   Hemoglobin 13.0 - 17.7 g/dL 16.1  09.6  04.5   HCT 37.5 - 51.0 % 37.5 - 51.0 % 47.2    47.2  46.0  42.5   Platelets 150 - 450 x10E3/uL 262  238  231   NEUT# 1.4 - 7.0 x10E3/uL 1.9  1.8    Lymph# 0.7 - 3.1 x10E3/uL 1.5  1.4        ASSESSMENT AND PLAN: 52 year old male with history of coronary artery disease/ischemic cardiomyopathy status post CABG in 2019, on aspirin and Plavix overdue for initial colon cancer screening.  I had a discussion with the patient regarding colon cancer screening modalities, namely colonoscopy versus stool based test.  We reviewed the details pros and cons/risks and benefits of each.  After this discussion, the patient decided he wanted to proceed with a colonoscopy.  Given his low EF, his colonoscopy will need to be performed in the hospital.  He is aware that the wait list for  hospital procedures is long, and that it may be close to the end of the year before we have availability. Will plan to hold his Plavix 5 days prior to his colonoscopy.  We will send a letter to his cardiologist to ensure that this is reasonable.  Colon cancer screening - Colonoscopy (hospital setting, given low EF) - Hold Plavix 5 days (pending cardiology approval)  The details, risks (including bleeding, perforation, infection, missed lesions, medication reactions and possible hospitalization or surgery if complications occur), benefits, and alternatives to colonoscopy with possible biopsy and possible polypectomy were discussed with the patient and he consents to proceed.   Reem Fleury E. Tomasa Rand, MD [facility]  CC: Julieanne Manson, MD

## 2023-02-19 ENCOUNTER — Ambulatory Visit (HOSPITAL_BASED_OUTPATIENT_CLINIC_OR_DEPARTMENT_OTHER): Payer: Medicare Other | Attending: Internal Medicine | Admitting: Internal Medicine

## 2023-02-19 VITALS — Ht 72.0 in | Wt 233.0 lb

## 2023-02-19 DIAGNOSIS — R0683 Snoring: Secondary | ICD-10-CM | POA: Diagnosis present

## 2023-02-19 DIAGNOSIS — G4733 Obstructive sleep apnea (adult) (pediatric): Secondary | ICD-10-CM | POA: Diagnosis not present

## 2023-02-23 DIAGNOSIS — G4733 Obstructive sleep apnea (adult) (pediatric): Secondary | ICD-10-CM | POA: Diagnosis not present

## 2023-02-23 NOTE — Procedures (Signed)
   Patient Name: Allen Cox, Allen Cox Date: 02/19/2023 Gender: Male D.O.B: [birth year] Age (years): 52 Referring Provider: Julieanne Manson Height (inches): 72 Interpreting Physician: Jetty Duhamel MD, ABSM Weight (lbs): 233 RPSGT: Rosette Reveal BMI: 32 MRN: [id number] Neck Size: 17.00  CLINICAL INFORMATION Sleep Study Type: NPSG Indication for sleep study: Diabetes, Hypertension Epworth Sleepiness Score: 13  SLEEP STUDY TECHNIQUE As per the AASM Manual for the Scoring of Sleep and Associated Events v2.3 (April 2016) with a hypopnea requiring 4% desaturations.  The channels recorded and monitored were frontal, central and occipital EEG, electrooculogram (EOG), submentalis EMG (chin), nasal and oral airflow, thoracic and abdominal wall motion, anterior tibialis EMG, snore microphone, electrocardiogram, and pulse oximetry.  MEDICATIONS Medications self-administered by patient taken the night of the study : none reported  SLEEP ARCHITECTURE The study was initiated at 10:53:30 PM and ended at 4:56:59 AM.  Sleep onset time was 0.3 minutes and the sleep efficiency was 90.6%. The total sleep time was 329.2 minutes.  Stage REM latency was 118.0 minutes.  The patient spent 5.5% of the night in stage N1 sleep, 76.6% in stage N2 sleep, 0.0% in stage N3 and 17.9% in REM.  Alpha intrusion was absent.  Supine sleep was 0.00%.  RESPIRATORY PARAMETERS The overall apnea/hypopnea index (AHI) was 3.6 per hour. There were 0 total apneas, including 0 obstructive, 0 central and 0 mixed apneas. There were 20 hypopneas and 8 RERAs.  The AHI during Stage REM sleep was 17.3 per hour.  AHI while supine was N/A per hour.  The mean oxygen saturation was 93.7%. The minimum SpO2 during sleep was 85.0%.  loud snoring was noted during this study.  CARDIAC DATA The 2 lead EKG demonstrated sinus rhythm. The mean heart rate was 80.7 beats per minute. Other EKG findings include: None.  LEG  MOVEMENT DATA The total PLMS were 0 with a resulting PLMS index of 0.0. Associated arousal with leg movement index was 0.0 .  IMPRESSIONS - No significant obstructive sleep apnea occurred during this study (AHI = 3.6/h). - Mild oxygen desaturation was noted during this study (Min O2 = 85.0%, Mean 93.7%). Time with O2 saturation 88% or less was 3.4 minutes - The patient snored with loud snoring volume. - No cardiac abnormalities were noted during this study. - Clinically significant periodic limb movements did not occur during sleep. No significant associated arousals. - No signifiant parasomnia or REM associated motor activity.  DIAGNOSIS - Primary Snoring  RECOMMENDATIONS - Manage for snoring and symptoms based on clinical judgment. - Sleep hygiene should be reviewed to assess factors that may improve sleep quality. - Weight management and regular exercise should be initiated or continued if appropriate.  [Electronically signed] 02/23/2023 12:45 PM  Jetty Duhamel MD, ABSM Diplomate, American Board of Sleep Medicine NPI: [id number]                          Jetty Duhamel Diplomate, American Board of Sleep Medicine  ELECTRONICALLY SIGNED ON:  02/23/2023, 12:42 PM [facility] PH: [phone]   FX: [phone] ACCREDITED BY THE AMERICAN ACADEMY OF SLEEP MEDICINE

## 2023-02-25 ENCOUNTER — Other Ambulatory Visit (INDEPENDENT_AMBULATORY_CARE_PROVIDER_SITE_OTHER): Payer: Medicare Other | Admitting: Internal Medicine

## 2023-02-25 VITALS — Wt 236.5 lb

## 2023-02-25 DIAGNOSIS — E119 Type 2 diabetes mellitus without complications: Secondary | ICD-10-CM

## 2023-02-26 LAB — MICROALBUMIN / CREATININE URINE RATIO
Creatinine, Urine: 71.6 mg/dL
Microalb/Creat Ratio: <4 mg/g creat (ref 0–29)
Microalbumin, Urine: <3 ug/mL

## 2023-02-26 MED ORDER — DULAGLUTIDE 0.75 MG/0.5ML ~~LOC~~ SOAJ: 1.5000 mg | SUBCUTANEOUS | 11 refills | Status: DC

## 2023-02-26 MED ORDER — TRUE METRIX BLOOD GLUCOSE TEST VI STRP: ORAL_STRIP | 11 refills | Status: DC

## 2023-02-26 MED ORDER — DULAGLUTIDE 1.5 MG/0.5ML ~~LOC~~ SOAJ: 1.5000 mg | SUBCUTANEOUS | 11 refills | Status: DC

## 2023-02-26 NOTE — Progress Notes (Signed)
Weight actually up a bit and sugars still running in 200s

## 2023-02-27 ENCOUNTER — Other Ambulatory Visit: Payer: Self-pay

## 2023-02-27 DIAGNOSIS — E119 Type 2 diabetes mellitus without complications: Secondary | ICD-10-CM

## 2023-02-27 MED ORDER — TRUE METRIX BLOOD GLUCOSE TEST VI STRP
ORAL_STRIP | 11 refills | Status: DC
Start: 2023-02-27 — End: 2023-06-10

## 2023-02-28 ENCOUNTER — Encounter: Payer: Medicare Other | Admitting: Gastroenterology

## 2023-03-05 ENCOUNTER — Other Ambulatory Visit: Payer: Medicare Other

## 2023-03-06 ENCOUNTER — Other Ambulatory Visit: Payer: Self-pay

## 2023-03-06 DIAGNOSIS — Z1211 Encounter for screening for malignant neoplasm of colon: Secondary | ICD-10-CM

## 2023-04-11 ENCOUNTER — Telehealth: Payer: Self-pay

## 2023-04-11 NOTE — Telephone Encounter (Signed)
After completing sleep studies. Patient would like to know if he needs to get Rx for cpap machine.

## 2023-04-16 ENCOUNTER — Emergency Department (HOSPITAL_BASED_OUTPATIENT_CLINIC_OR_DEPARTMENT_OTHER)
Admission: EM | Admit: 2023-04-16 | Discharge: 2023-04-16 | Discharge status: Against Medical Advice | Payer: Medicare Other | Attending: Emergency Medicine | Admitting: Emergency Medicine

## 2023-04-16 ENCOUNTER — Other Ambulatory Visit: Payer: Self-pay

## 2023-04-16 DIAGNOSIS — Z5321 Procedure and treatment not carried out due to patient leaving prior to being seen by health care provider: Secondary | ICD-10-CM | POA: Insufficient documentation

## 2023-04-16 DIAGNOSIS — R519 Headache, unspecified: Secondary | ICD-10-CM | POA: Insufficient documentation

## 2023-04-16 DIAGNOSIS — H538 Other visual disturbances: Secondary | ICD-10-CM | POA: Diagnosis not present

## 2023-04-16 NOTE — ED Notes (Signed)
Registration reports this patient has left. Labels turned in.

## 2023-04-16 NOTE — ED Triage Notes (Signed)
Pt presents to the er with a "major headache" that's been going on since Sunday. Pt reports the pain over his left temporal. Pt also reports blurred vision. Pt denies n/v

## 2023-04-17 ENCOUNTER — Other Ambulatory Visit: Payer: Self-pay

## 2023-04-17 ENCOUNTER — Emergency Department (HOSPITAL_BASED_OUTPATIENT_CLINIC_OR_DEPARTMENT_OTHER): Payer: Medicare Other

## 2023-04-17 ENCOUNTER — Other Ambulatory Visit (HOSPITAL_BASED_OUTPATIENT_CLINIC_OR_DEPARTMENT_OTHER): Payer: Self-pay

## 2023-04-17 ENCOUNTER — Encounter (HOSPITAL_BASED_OUTPATIENT_CLINIC_OR_DEPARTMENT_OTHER): Payer: Self-pay | Admitting: Emergency Medicine

## 2023-04-17 ENCOUNTER — Emergency Department (HOSPITAL_BASED_OUTPATIENT_CLINIC_OR_DEPARTMENT_OTHER)
Admission: EM | Admit: 2023-04-17 | Discharge: 2023-04-17 | Disposition: A | Discharge status: Home / Self Care | Payer: Medicare Other | Attending: Emergency Medicine | Admitting: Emergency Medicine

## 2023-04-17 DIAGNOSIS — E119 Type 2 diabetes mellitus without complications: Secondary | ICD-10-CM | POA: Diagnosis not present

## 2023-04-17 DIAGNOSIS — Z7984 Long term (current) use of oral hypoglycemic drugs: Secondary | ICD-10-CM | POA: Diagnosis not present

## 2023-04-17 DIAGNOSIS — Z720 Tobacco use: Secondary | ICD-10-CM | POA: Insufficient documentation

## 2023-04-17 DIAGNOSIS — Z79899 Other long term (current) drug therapy: Secondary | ICD-10-CM | POA: Diagnosis not present

## 2023-04-17 DIAGNOSIS — U071 COVID-19: Secondary | ICD-10-CM | POA: Insufficient documentation

## 2023-04-17 DIAGNOSIS — Z7982 Long term (current) use of aspirin: Secondary | ICD-10-CM | POA: Diagnosis not present

## 2023-04-17 DIAGNOSIS — I10 Essential (primary) hypertension: Secondary | ICD-10-CM | POA: Diagnosis not present

## 2023-04-17 DIAGNOSIS — Z7902 Long term (current) use of antithrombotics/antiplatelets: Secondary | ICD-10-CM | POA: Diagnosis not present

## 2023-04-17 DIAGNOSIS — R519 Headache, unspecified: Secondary | ICD-10-CM | POA: Diagnosis present

## 2023-04-17 DIAGNOSIS — J0120 Acute ethmoidal sinusitis, unspecified: Secondary | ICD-10-CM | POA: Insufficient documentation

## 2023-04-17 DIAGNOSIS — I2510 Atherosclerotic heart disease of native coronary artery without angina pectoris: Secondary | ICD-10-CM | POA: Insufficient documentation

## 2023-04-17 LAB — RESP PANEL BY RT-PCR (RSV, FLU A&B, COVID)  RVPGX2
Influenza A by PCR: NEGATIVE
Influenza B by PCR: NEGATIVE
Resp Syncytial Virus by PCR: NEGATIVE
SARS Coronavirus 2 by RT PCR: POSITIVE — AB

## 2023-04-17 MED ORDER — AMOXICILLIN-POT CLAVULANATE 875-125 MG PO TABS
1.0000 | ORAL_TABLET | Freq: Two times a day (BID) | ORAL | 0 refills | Status: DC
  Filled 2023-04-17 (×2): qty 14, 7d supply, fill #0

## 2023-04-17 MED ORDER — ACETAMINOPHEN 500 MG PO TABS
1000.0000 mg | ORAL_TABLET | Freq: Once | ORAL | Status: AC
  Administered 2023-04-17: 1000 mg via ORAL
  Filled 2023-04-17: qty 2

## 2023-04-17 MED ORDER — ACETAMINOPHEN 500 MG PO TABS
500.0000 mg | ORAL_TABLET | Freq: Four times a day (QID) | ORAL | 0 refills | Status: AC | PRN
  Filled 2023-04-17: qty 30, 8d supply, fill #0

## 2023-04-17 NOTE — Discharge Instructions (Signed)
CT scan today shows that you have sinusitis which is likely the cause of your headache and congestion.  Please take antibiotic as prescribed.  Your test also shows that you test positive for COVID infection.  Follow instruction below.  Recommendations for at home COVID-19 symptoms management:  Please continue isolation at home. If have acute worsening of symptoms please go to ER/urgent care for further evaluation. Check pulse oximetry and if below 90-92% please go to ER. The following supplements MAY help:  Vitamin C 500mg  twice a day and Quercetin 250-500 mg twice a day Vitamin D3 2000 - 4000 u/day B Complex vitamins Zinc 75-100 mg/day Melatonin 6-10 mg at night (the optimal dose is unknown) Aspirin 81mg /day (if no history of bleeding issues)

## 2023-04-17 NOTE — ED Provider Notes (Signed)
[place] EMERGENCY DEPARTMENT AT [facility] Provider Note   CSN: [id number] Arrival date & time: 04/17/23  6962     History  Chief Complaint  Patient presents with   Headache   Foot Injury    Boss L Skeet Rosenboom. is a 52 y.o. male.  The history is provided by the patient and medical records. No language interpreter was used.  Headache Foot Injury    52 year old male significant history of diabetes, obesity, CAD, tobacco use, hypertension, presenting with 2 different complaints.  Patient endorsed recurrent left sided headache ongoing for the past 3 days.  Described pain as a sharp stabbing sensation behind his left eye that is waxing and waning but is becoming progressively worse.  Endorse some light sensitivity with heaviness sensation in that affected area.  Also endorsed some sinus congestion and runny nose as well.  He tries taking Tylenol and ibuprofen at home with some improvement.  He does endorse some subjective fever and chills but denies any sore throat, ear pain, hearing loss, productive cough.  No chest pain or shortness of breath no abdominal pain no nausea vomiting or diarrhea.  Patient mention he almost never get headache and this headache concerns him.  Patient states he does have history of diabetes and he noticed an ulceration to his right great toe a week ago.  He been using an abrasive glove to trying to self treat this "ulceration" but would like to be evaluated.  Denies any significant pain or discomfort to the affected area.  He mention his diabetes is very well-controlled.  States he checked his blood sugar on a daily basis and is usually range between 80-115.  He is compliant with his medication.  He denies any numbness of his toe.  Home Medications Prior to Admission medications   Medication Sig Start Date End Date Taking? Authorizing Provider  aspirin EC 81 MG tablet Take 1 tablet (81 mg total) by mouth daily. 01/30/18   Bhagat, Sharrell Ku, PA   atorvastatin (LIPITOR) 80 MG tablet Take 1 tablet (80 mg total) by mouth at bedtime. 11/19/22   Tonny Bollman, MD  blood glucose meter kit and supplies KIT Dispense based on patient and insurance preference. Use up to four times daily as directed. (FOR ICD-9 250.00, 250.01). 01/21/18   Loreli Slot, MD  carvedilol (COREG) 12.5 MG tablet Take 1 tablet (12.5 mg total) by mouth 2 (two) times daily. 11/19/22 11/14/23  Tonny Bollman, MD  citalopram (CELEXA) 20 MG tablet Take 1 tablet (20 mg total) by mouth daily. 05/17/21   Julieanne Manson, MD  clopidogrel (PLAVIX) 75 MG tablet Take 1 tablet (75 mg total) by mouth daily. 11/19/22   Tonny Bollman, MD  Dulaglutide 1.5 MG/0.5ML SOPN Inject 1.5 mg into the skin once a week. 02/26/23   Julieanne Manson, MD  empagliflozin (JARDIANCE) 25 MG TABS tablet Take 1 tablet (25 mg total) by mouth daily before breakfast. 02/06/23   Julieanne Manson, MD  ezetimibe (ZETIA) 10 MG tablet Take 1 tablet (10 mg total) by mouth daily. 11/19/22   Tonny Bollman, MD  famotidine (PEPCID) 40 MG tablet Take 1 tablet (40 mg total) by mouth at bedtime. 01/21/23   Julieanne Manson, MD  gabapentin (NEURONTIN) 300 MG capsule TAKE 1 CAPSULE BY MOUTH ONCE DAILY ONE HOUR BEFORE BEDTIME 05/11/21   [provider]  glipiZIDE (GLUCOTROL) 10 MG tablet Take 1 tablet (10 mg total) by mouth 2 (two) times daily before a meal. 05/17/21   Julieanne Manson,  MD  glucose blood (TRUE METRIX BLOOD GLUCOSE TEST) test strip Check blood glucose twice daily before meals 02/27/23   Julieanne Manson, MD  Lancets MISC Check blood glucose twice daily before meals 02/11/18   Julieanne Manson, MD  metFORMIN (GLUCOPHAGE) 1000 MG tablet Take 1 tablet (1,000 mg total) by mouth 2 (two) times daily with a meal. 05/17/21   Julieanne Manson, MD  sacubitril-valsartan (ENTRESTO) 49-51 MG Take 1 tablet by mouth 2 (two) times daily. 11/19/22   Tonny Bollman, MD  spironolactone (ALDACTONE) 25  MG tablet Take 0.5 tablets (12.5 mg total) by mouth daily. 11/19/22   Tonny Bollman, MD      Allergies    Patient has no known allergies.    Review of Systems   Review of Systems  Neurological:  Positive for headaches.  All other systems reviewed and are negative.   Physical Exam Updated Vital Signs BP 109/74 (BP Location: Right Arm)   Pulse 93   Temp 98.1 F (36.7 C) (Oral)   Resp 18   Ht 6' (1.829 m)   Wt 106.1 kg   SpO2 99%   BMI 31.74 kg/m  Physical Exam Vitals and nursing note reviewed.  Constitutional:      General: He is not in acute distress.    Appearance: He is well-developed. He is obese.     Comments: Patient is resting comfortably in bed appears to be in no acute discomfort.  HENT:     Head: Normocephalic and atraumatic.     Right Ear: Tympanic membrane normal.     Left Ear: Tympanic membrane normal.     Nose: Nose normal.     Mouth/Throat:     Mouth: Mucous membranes are moist.     Pharynx: Oropharynx is clear. No oropharyngeal exudate or posterior oropharyngeal erythema.  Eyes:     Extraocular Movements: Extraocular movements intact.     Conjunctiva/sclera: Conjunctivae normal.     Pupils: Pupils are equal, round, and reactive to light.  Neck:     Meningeal: Brudzinski's sign absent.  Cardiovascular:     Rate and Rhythm: Normal rate and regular rhythm.     Pulses: Normal pulses.     Heart sounds: Normal heart sounds.  Pulmonary:     Effort: Pulmonary effort is normal.     Breath sounds: Normal breath sounds.  Abdominal:     General: Bowel sounds are normal.     Palpations: Abdomen is soft.  Musculoskeletal:     Cervical back: Normal range of motion and neck supple. No rigidity.  Lymphadenopathy:     Cervical: No cervical adenopathy.  Skin:    Findings: No rash.     Comments: Right great toe: Some callus buildup noted to the pad of the toe but no signs of obvious ulceration and no signs of infection.  It is nontender to palpation.  Sensation  is intact with brisk cap refill.  Neurological:     General: No focal deficit present.     Mental Status: He is alert and oriented to person, place, and time. Mental status is at baseline.     Cranial Nerves: No cranial nerve deficit.     Sensory: No sensory deficit.     Motor: No weakness.     Coordination: Coordination normal.     Gait: Gait normal.     Deep Tendon Reflexes: Reflexes normal.  Psychiatric:        Mood and Affect: Mood normal.     ED Results /  Procedures / Treatments   Labs (all labs ordered are listed, but only abnormal results are displayed) Labs Reviewed  RESP PANEL BY RT-PCR (RSV, FLU A&B, COVID)  [id number] - Abnormal; Notable for the following components:      Result Value   SARS Coronavirus 2 by RT PCR POSITIVE (*)    All other components within normal limits    EKG None  Radiology CT Head Wo Contrast  Result Date: 04/17/2023 CLINICAL DATA:  Provided history: Headache, new onset. EXAM: CT HEAD WITHOUT CONTRAST TECHNIQUE: Contiguous axial images were obtained from the base of the skull through the vertex without intravenous contrast. RADIATION DOSE REDUCTION: This exam was performed according to the departmental dose-optimization program which includes automated exposure control, adjustment of the mA and/or kV according to patient size and/or use of iterative reconstruction technique. COMPARISON:  Head CT 09/20/2021. FINDINGS: Brain: Cerebral volume is normal. There is no acute intracranial hemorrhage. No demarcated cortical infarct. No extra-axial fluid collection. No evidence of an intracranial mass. No midline shift. Vascular: No hyperdense vessel.  Atherosclerotic calcifications. Skull: No calvarial fracture or aggressive osseous lesion. Sinuses/Orbits: No mass or acute finding within the imaged orbits. Moderate partial opacification of the left frontal sinus. Near complete opacification of anterior left ethmoid air cells. Mild mucosal thickening within anterior  right ethmoid air cells. Mild-to-moderate polypoid mucosal thickening within the left sphenoid sinus. Complete opacification of the left maxillary sinus at the imaged levels (with associated chronic reactive osteitis). Other: Trace fluid within the right mastoid air cells. IMPRESSION: 1. No evidence of an acute intracranial abnormality. 2. Paranasal sinus disease at the imaged levels, as described (much of which is consistent a left ostiomeatal unit obstructive pattern). 3. Trace fluid within the right mastoid air cells. Electronically Signed   By: Jackey Loge D.O.   On: 04/17/2023 10:54    Procedures Procedures    Medications Ordered in ED Medications  acetaminophen (TYLENOL) tablet 1,000 mg (1,000 mg Oral Given 04/17/23 1000)    ED Course/ Medical Decision Making/ A&P                                 Medical Decision Making Amount and/or Complexity of Data Reviewed Radiology: ordered.  Risk OTC drugs.   BP 109/74 (BP Location: Right Arm)   Pulse 93   Temp 98.1 F (36.7 C) (Oral)   Resp 18   Ht 6' (1.829 m)   Wt 106.1 kg   SpO2 99%   BMI 31.74 kg/m   9:49 AM 52 year old male significant history of diabetes, obesity, CAD, tobacco use, hypertension, presenting with 2 different complaints.  Patient endorsed recurrent left sided headache ongoing for the past 3 days.  Described pain as a sharp stabbing sensation behind his left eye that is waxing and waning but is becoming progressively worse.  Endorse some light sensitivity with heaviness sensation in that affected area.  Also endorsed some sinus congestion and runny nose as well.  He tries taking Tylenol and ibuprofen at home with some improvement.  He does endorse some subjective fever and chills but denies any sore throat, ear pain, hearing loss, productive cough.  No chest pain or shortness of breath no abdominal pain no nausea vomiting or diarrhea.  Patient mention he almost never get headache and this headache concerns him.   Patient states he does have history of diabetes and he noticed an ulceration to his right great toe  a week ago.  He been using an abrasive glove to trying to self treat this "ulceration" but would like to be evaluated.  Denies any significant pain or discomfort to the affected area.  He mention his diabetes is very well-controlled.  States he checked his blood sugar on a daily basis and is usually range between 80-115.  He is compliant with his medication.  He denies any numbness of his toe.  On exam this is a well-appearing male resting comfortably in bed appears to be in no acute discomfort.  He does not exhibit any nuchal rigidity concerning for meningitis.  He does not have any focal neurodeficit of facial droop concerning for stroke.  He does have some tenderness to palpation around the left orbit but no concerning skin changes no signs of orbital cellulitis and he has normal conjunctiva and sclera.  Examination of right foot shows some callus buildup that was recently shaved which were noted on the pad of the right great toe without signs of infection.  No ulceration.  Vital sign review and overall reassuring.  Due to new onset of headache in a patient that normally does not have headache, I will obtain a head CT scan.  Will also check viral respiratory panel since patient endorsed some congestion.  Will also give Tylenol for headache.  Patient test positive for COVID.  Head CT scan without any acute intracranial abnormalities however patient does have evidence of paranasal sinus disease likely contributing to his headache.  Given the finding of his CT scan, will treat for sinusitis with Augmentin.  Paxlovid considered for COVID infection.  Patient overall stable to be discharged home.  Return precaution given.`        Final Clinical Impression(s) / ED Diagnoses Final diagnoses:  Acute ethmoidal sinusitis, recurrence not specified  COVID-19 virus infection    Rx / DC Orders ED Discharge  Orders          Ordered    acetaminophen (TYLENOL) 500 MG tablet  Every 6 hours PRN        04/17/23 1127    amoxicillin-clavulanate (AUGMENTIN) 875-125 MG tablet  Every 12 hours        04/17/23 1127              Fayrene Helper, PA-C 04/17/23 1128    Rondel Baton, MD 04/17/23 1626

## 2023-04-17 NOTE — ED Triage Notes (Signed)
Pt via pov from home with headaches since Sunday and an foot injury. Pt came yesterday for the same but left before completing care. Pt alert & oriented, nad noted.

## 2023-04-19 NOTE — Telephone Encounter (Signed)
Patient has been notified of results.  

## 2023-04-26 ENCOUNTER — Other Ambulatory Visit (INDEPENDENT_AMBULATORY_CARE_PROVIDER_SITE_OTHER): Payer: Medicare Other

## 2023-04-26 DIAGNOSIS — E119 Type 2 diabetes mellitus without complications: Secondary | ICD-10-CM | POA: Diagnosis not present

## 2023-04-27 LAB — HEMOGLOBIN A1C
Est. average glucose Bld gHb Est-mCnc: 174 mg/dL
Hgb A1c MFr Bld: 7.7 % — ABNORMAL HIGH (ref 4.8–5.6)

## 2023-04-29 ENCOUNTER — Encounter: Payer: Self-pay | Admitting: Internal Medicine

## 2023-04-29 ENCOUNTER — Ambulatory Visit (INDEPENDENT_AMBULATORY_CARE_PROVIDER_SITE_OTHER): Payer: Medicare Other | Admitting: Internal Medicine

## 2023-04-29 VITALS — BP 120/84 | HR 80 | Resp 12 | Ht 71.0 in | Wt 235.0 lb

## 2023-04-29 DIAGNOSIS — E669 Obesity, unspecified: Secondary | ICD-10-CM | POA: Diagnosis not present

## 2023-04-29 DIAGNOSIS — L84 Corns and callosities: Secondary | ICD-10-CM

## 2023-04-29 DIAGNOSIS — E119 Type 2 diabetes mellitus without complications: Secondary | ICD-10-CM | POA: Diagnosis not present

## 2023-04-29 DIAGNOSIS — I255 Ischemic cardiomyopathy: Secondary | ICD-10-CM

## 2023-04-29 DIAGNOSIS — G4700 Insomnia, unspecified: Secondary | ICD-10-CM

## 2023-04-29 DIAGNOSIS — E782 Mixed hyperlipidemia: Secondary | ICD-10-CM | POA: Diagnosis not present

## 2023-04-29 DIAGNOSIS — F515 Nightmare disorder: Secondary | ICD-10-CM

## 2023-04-29 MED ORDER — GABAPENTIN 100 MG PO CAPS: 100.0000 mg | ORAL_CAPSULE | Freq: Every day | ORAL | 3 refills | Status: DC

## 2023-04-29 MED ORDER — DULAGLUTIDE 3 MG/0.5ML ~~LOC~~ SOAJ: 3.0000 mg | SUBCUTANEOUS | 4 refills | Status: DC

## 2023-04-29 MED ORDER — GABAPENTIN 300 MG PO CAPS: 300.0000 mg | ORAL_CAPSULE | Freq: Every day | ORAL | 3 refills | Status: AC

## 2023-04-29 NOTE — Progress Notes (Signed)
Subjective:    Patient ID: Allen Riches., male   DOB: December 17, 1970, 52 y.o.   MRN: [id number]   HPI   DM:  A1C recently 7.7%.  Currently using Trulicity 1.5 mg weekly and Jardiance 25 mg daily.  Metformin 1000 mg twice daily and Glipizide 10 mg twice daily.  He was previously above 13% in May before increase in [place] and starting Trulicity.  No definite decreased weight trend.    2.  Hypertension:  controlled.  No problems with meds.  3.  Hyperlipidemia:  walks 25 minutes daily if not too hot.  Cholesterol panel okay in April save for low HDL.    4.  Sinusitis:  went to urgent care and diagnosed with sinusitis and placed on Augmentin.  He is feeling baseline now.  Has 2 more days of abx.   No diarrhea.    5.  Poor sleep due to nightmares.  Awakening at 4-4:30 a.m.   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  Problem since before May when he followed up after about 2 year hiatus.  He cannot recall any new med being used for first time prior to symptoms.    Current Meds  Medication Sig   acetaminophen (TYLENOL) 500 MG tablet Take 1 tablet (500 mg total) by mouth every 6 (six) hours as needed.   amoxicillin-clavulanate (AUGMENTIN) 875-125 MG tablet Take 1 tablet by mouth every 12 (twelve) hours.   aspirin EC 81 MG tablet Take 1 tablet (81 mg total) by mouth daily.   atorvastatin (LIPITOR) 80 MG tablet Take 1 tablet (80 mg total) by mouth at bedtime.   blood glucose meter kit and supplies KIT Dispense based on patient and insurance preference. Use up to four times daily as directed. (FOR ICD-9 250.00, 250.01).   carvedilol (COREG) 12.5 MG tablet Take 1 tablet (12.5 mg total) by mouth 2 (two) times daily.   citalopram (CELEXA) 20 MG tablet Take 1 tablet (20 mg total) by mouth daily.   clopidogrel (PLAVIX) 75 MG tablet Take 1 tablet (75 mg total) by mouth daily.   Dulaglutide 1.5 MG/0.5ML SOPN Inject 1.5 mg into the skin once a week.   empagliflozin (JARDIANCE) 25 MG TABS tablet Take  1 tablet (25 mg total) by mouth daily before breakfast.   ezetimibe (ZETIA) 10 MG tablet Take 1 tablet (10 mg total) by mouth daily.   famotidine (PEPCID) 40 MG tablet Take 1 tablet (40 mg total) by mouth at bedtime.   gabapentin (NEURONTIN) 300 MG capsule TAKE 1 CAPSULE BY MOUTH ONCE DAILY ONE HOUR BEFORE BEDTIME   glipiZIDE (GLUCOTROL) 10 MG tablet Take 1 tablet (10 mg total) by mouth 2 (two) times daily before a meal.   glucose blood (TRUE METRIX BLOOD GLUCOSE TEST) test strip Check blood glucose twice daily before meals   Lancets MISC Check blood glucose twice daily before meals   metFORMIN (GLUCOPHAGE) 1000 MG tablet Take 1 tablet (1,000 mg total) by mouth 2 (two) times daily with a meal.   sacubitril-valsartan (ENTRESTO) 49-51 MG Take 1 tablet by mouth 2 (two) times daily.   spironolactone (ALDACTONE) 25 MG tablet Take 0.5 tablets (12.5 mg total) by mouth daily.   No Known Allergies   Review of Systems    Objective:   BP 120/84 (BP Location: Right Arm, Patient Position: Sitting, Cuff Size: Normal)   Pulse 80   Resp 12   Ht 5\' 11"  (1.803 m)   Wt 235 lb (106.6 kg)  BMI 32.78 kg/m   Physical Exam HENT:     Head: Normocephalic and atraumatic.     Right Ear: Tympanic membrane, ear canal and external ear normal.     Left Ear: Tympanic membrane, ear canal and external ear normal.     Mouth/Throat:     Mouth: Mucous membranes are moist.     Pharynx: Oropharynx is clear.  Eyes:     Extraocular Movements: Extraocular movements intact.     Conjunctiva/sclera: Conjunctivae normal.     Pupils: Pupils are equal, round, and reactive to light.  Cardiovascular:     Rate and Rhythm: Normal rate and regular rhythm.     Heart sounds: S1 normal and S2 normal. No murmur heard.    No friction rub. No S3 or S4 sounds.     Comments: No carotid bruits.  Carotid, radial, femoral, DP and PT pulses normal and equal.   Pulmonary:     Effort: Pulmonary effort is normal.     Breath sounds:  Normal breath sounds.  Abdominal:     General: Bowel sounds are normal.     Palpations: Abdomen is soft. There is no mass.     Tenderness: There is no abdominal tenderness.     Hernia: No hernia is present.  Musculoskeletal:     Cervical back: Normal range of motion and neck supple.     Right lower leg: No edema.     Left lower leg: No edema.  Feet:     Right foot:     Protective Sensation: 10 sites tested.  2 sites sensed.     Skin integrity: Callus (base of great toe/plantar aspect) present.     Toenail Condition: Right toenails are abnormally thick. Fungal disease present.    Left foot:     Protective Sensation: 10 sites tested.  2 sites sensed.     Toenail Condition: Left toenails are abnormally thick. Fungal disease present. Skin:    General: Skin is warm.     Capillary Refill: Capillary refill takes less than 2 seconds.      Assessment & Plan   DM:  much improved control.  Increase Trulicity to 3 mg weekly.  Would like to wean glipizide if possible  Will obtain ortho medical clearance paperwork for Cspine procedure.  Referral for diabetic eye exam.    Hypertension:  controlled.   3.  Diabetic foot changes/callus with decreased sensation:  Referral to podiatry.   4.  Nightmares:  discussed weaning Gabapentin to see if that made difference, but he does not want to do that with his neuropathic discomfort that gabapentin controls.  Referral to [facility], LCSWA to see if counseling clarifies reason for his nightmares dealing with dismemberment.  ?Possible history of trauma.  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   6.  HM:  He will look into all recommended vaccines:  pneumococcal 20, influenza, COVID, Shingrix. Calling orto to get repeat med clearance paperwork Jacqlyn Larsen)

## 2023-05-02 NOTE — Progress Notes (Deleted)
Office Visit    Patient Name: Allen Cox. Date of Encounter: 05/02/2023  Primary Care Provider:  Julieanne Manson, MD Primary Cardiologist:  Tonny Bollman, MD Primary Electrophysiologist: None   Past Medical History    Past Medical History:  Diagnosis Date   CAD (coronary artery disease) of bypass graft    DM (diabetes mellitus) (HCC)    HLD (hyperlipidemia)    Ischemic cardiomyopathy    Primary hypertension 10/08/2021   Tobacco use    VT (ventricular tachycardia) (HCC)    Past Surgical History:  Procedure Laterality Date   CORONARY ARTERY BYPASS GRAFT N/A 01/08/2018   Procedure: CORONARY ARTERY BYPASS GRAFTING (CABG) x five, using Left Internal Mammary Artery and Right Leg Greater Saphenous Vein harvested endoscopically;  Surgeon: Loreli Slot, MD;  Location: [facility] OR;  Service: Open Heart Surgery;  Laterality: N/A;   LEFT HEART CATH AND CORONARY ANGIOGRAPHY N/A 01/03/2018   Procedure: LEFT HEART CATH AND CORONARY ANGIOGRAPHY;  Surgeon: Corky Crafts, MD;  Location: [facility] INVASIVE CV LAB;  Service: Cardiovascular;  Laterality: N/A;   SHOULDER SURGERY Left    TEE WITHOUT CARDIOVERSION N/A 01/08/2018   Procedure: TRANSESOPHAGEAL ECHOCARDIOGRAM (TEE);  Surgeon: Loreli Slot, MD;  Location: [facility] OR;  Service: Open Heart Surgery;  Laterality: N/A;   WRIST ARTHROPLASTY Right     Allergies  No Known Allergies   History of Present Illness    Allen Cox. is a 52 y.o. male with PMH of CAD s/p NSTEMI and 2019 treated with CABG x5, DM, ICM, HLD, HFrEF who presents today for 2-month follow-up.  Allen Cox was originally seen for chest pain in 2014 and was found to have NSTEMI.  He underwent LHC that revealed three-vessel disease that was treated with CABG x5 using internal mammary and right leg saphenous.  He experienced VT postop that required shock/CPR.  He was discharged with amiodarone and was subsequently discontinued in 2019.  He had a  cardiac MRI which showed mildly dilated LV with normal wall thickness and EF of 34%.  He elected medical therapy over ICD and most recent 2D echo showed EF of EF 40-45%.  He was last seen by Dr. Excell Seltzer on 11/19/2022 and was doing well.  During visit fluid volume was stable and Entresto was increased to 49/51 twice daily and Jardiance was added to current regimen.  2D echo was repeated showing decreased EF of 35-40% unchanged from previous 2D echo.  Medication compliance was discussed and enforced with the patient.  Since last being seen in the office patient reports***.  Patient denies chest pain, palpitations, dyspnea, PND, orthopnea, nausea, vomiting, dizziness, syncope, edema, weight gain, or early satiety.     ***Notes:  Home Medications    Current Outpatient Medications  Medication Sig Dispense Refill   acetaminophen (TYLENOL) 500 MG tablet Take 1 tablet (500 mg total) by mouth every 6 (six) hours as needed. 30 tablet 0   amoxicillin-clavulanate (AUGMENTIN) 875-125 MG tablet Take 1 tablet by mouth every 12 (twelve) hours. 14 tablet 0   aspirin EC 81 MG tablet Take 1 tablet (81 mg total) by mouth daily. 90 tablet 3   atorvastatin (LIPITOR) 80 MG tablet Take 1 tablet (80 mg total) by mouth at bedtime. 90 tablet 3   blood glucose meter kit and supplies KIT Dispense based on patient and insurance preference. Use up to four times daily as directed. (FOR ICD-9 250.00, 250.01). 1 each 1   carvedilol (COREG) 12.5  MG tablet Take 1 tablet (12.5 mg total) by mouth 2 (two) times daily. 180 tablet 3   citalopram (CELEXA) 20 MG tablet Take 1 tablet (20 mg total) by mouth daily. 30 tablet 11   clopidogrel (PLAVIX) 75 MG tablet Take 1 tablet (75 mg total) by mouth daily. 90 tablet 3   Dulaglutide 3 MG/0.5ML SOPN Inject 3 mg into the skin once a week. 2 mL 4   empagliflozin (JARDIANCE) 25 MG TABS tablet Take 1 tablet (25 mg total) by mouth daily before breakfast. 30 tablet 11   ezetimibe (ZETIA) 10 MG  tablet Take 1 tablet (10 mg total) by mouth daily. 90 tablet 3   famotidine (PEPCID) 40 MG tablet Take 1 tablet (40 mg total) by mouth at bedtime. 30 tablet 6   gabapentin (NEURONTIN) 300 MG capsule Take 1 capsule (300 mg total) by mouth at bedtime. 90 capsule 3   glipiZIDE (GLUCOTROL) 10 MG tablet Take 1 tablet (10 mg total) by mouth 2 (two) times daily before a meal. 60 tablet 11   glucose blood (TRUE METRIX BLOOD GLUCOSE TEST) test strip Check blood glucose twice daily before meals 100 each 11   Lancets MISC Check blood glucose twice daily before meals 100 each 11   metFORMIN (GLUCOPHAGE) 1000 MG tablet Take 1 tablet (1,000 mg total) by mouth 2 (two) times daily with a meal. 60 tablet 11   sacubitril-valsartan (ENTRESTO) 49-51 MG Take 1 tablet by mouth 2 (two) times daily. 180 tablet 3   spironolactone (ALDACTONE) 25 MG tablet Take 0.5 tablets (12.5 mg total) by mouth daily. 45 tablet 3   No current facility-administered medications for this visit.     Review of Systems  Please see the history of present illness.    (+)*** (+)***  All other systems reviewed and are otherwise negative except as noted above.  Physical Exam    Wt Readings from Last 3 Encounters:  04/29/23 235 lb (106.6 kg)  04/17/23 234 lb (106.1 kg)  04/16/23 234 lb (106.1 kg)   ZO:XWRUE were no vitals filed for this visit.,There is no height or weight on file to calculate BMI.  Constitutional:      Appearance: Healthy appearance. Not in distress.  Neck:     Vascular: JVD normal.  Pulmonary:     Effort: Pulmonary effort is normal.     Breath sounds: No wheezing. No rales. Diminished in the bases Cardiovascular:     Normal rate. Regular rhythm. Normal S1. Normal S2.      Murmurs: There is no murmur.  Edema:    Peripheral edema absent.  Abdominal:     Palpations: Abdomen is soft non tender. There is no hepatomegaly.  Skin:    General: Skin is warm and dry.  Neurological:     General: No focal deficit  present.     Mental Status: Alert and oriented to person, place and time.     Cranial Nerves: Cranial nerves are intact.  EKG/LABS/ Recent Cardiac Studies    ECG personally reviewed by me today - ***   Risk Assessment/Calculations:   {Does this patient have ATRIAL FIBRILLATION?:[id number]}        Lab Results  Component Value Date   WBC 4.0 01/21/2023   HGB 15.2 01/21/2023   HCT 47.2 01/21/2023   HCT 47.2 01/21/2023   MCV 79 01/21/2023   PLT 262 01/21/2023   Lab Results  Component Value Date   CREATININE 0.77 01/21/2023   BUN 7 01/21/2023  NA 139 01/21/2023   K 4.3 01/21/2023   CL 103 01/21/2023   CO2 20 01/21/2023   Lab Results  Component Value Date   ALT 38 01/21/2023   AST 23 01/21/2023   ALKPHOS 142 (H) 01/21/2023   BILITOT 0.6 01/21/2023   Lab Results  Component Value Date   CHOL 114 01/01/2023   HDL 33 (L) 01/01/2023   LDLCALC 66 01/01/2023   TRIG 69 01/01/2023   CHOLHDL 5.5 (H) 11/19/2022    Lab Results  Component Value Date   HGBA1C 7.7 (H) 04/26/2023     Assessment & Plan    1.Chronic systolic heart failure: -Patient's last 2D echo was 01/2021  2.Coronary artery disease: -CAD s/p NSTEMI and 2019 treated with CABG x5 -Today patient reports   3.Hypertension: -Blood pressure today was   4. Hyperlipidemia: -Continue Lipitor and Zetia as noted above  5.  Obstructive sleep apnea:      Disposition: Follow-up with Tonny Bollman, MD or APP in *** months {Are you ordering a CV Procedure (e.g. stress test, cath, DCCV, TEE, etc)?   Press F2        :[id number]}   Medication Adjustments/Labs and Tests Ordered: Current medicines are reviewed at length with the patient today.  Concerns regarding medicines are outlined above.   Signed, Napoleon Form, Leodis Rains, NP 05/02/2023, 1:47 PM [facility]

## 2023-05-03 ENCOUNTER — Ambulatory Visit: Payer: Medicare Other | Admitting: Nurse Practitioner

## 2023-05-03 DIAGNOSIS — E785 Hyperlipidemia, unspecified: Secondary | ICD-10-CM

## 2023-05-03 DIAGNOSIS — G4733 Obstructive sleep apnea (adult) (pediatric): Secondary | ICD-10-CM

## 2023-05-03 DIAGNOSIS — I255 Ischemic cardiomyopathy: Secondary | ICD-10-CM

## 2023-05-03 DIAGNOSIS — I10 Essential (primary) hypertension: Secondary | ICD-10-CM

## 2023-05-03 DIAGNOSIS — I5020 Unspecified systolic (congestive) heart failure: Secondary | ICD-10-CM

## 2023-05-21 NOTE — Progress Notes (Signed)
Anesthesia Review:  PCP: elizaberry Mulberry LOV 04/29/23  Cardiologist : Chest x-ray : EKG :05/10/22  Echo : 12/03/22  Stress test: Cardiac Cath :  2019  Activity level:  Sleep Study/ CPAP : Fasting Blood Sugar :      / Checks Blood Sugar -- times a day:   Blood Thinner/ Instructions /Last Dose: ASA / Instructions/ Last Dose :    Plavix 81 mg aspirin    DM - type  04/26/23-hggba1c- 7.7  Dulaglutide-  Jardiance-  Metformin- Glucotrol-     Covid positive on 04/17/23- in ED

## 2023-05-22 NOTE — Progress Notes (Signed)
Office Visit    Patient Name: Allen Cox. Date of Encounter: 05/23/2023  Primary Care Provider:  Julieanne Manson, MD Primary Cardiologist:  Tonny Bollman, MD Primary Electrophysiologist: None   Past Medical History    Past Medical History:  Diagnosis Date   CAD (coronary artery disease) of bypass graft    DM (diabetes mellitus) (HCC)    HLD (hyperlipidemia)    Ischemic cardiomyopathy    Primary hypertension 10/08/2021   Tobacco use    VT (ventricular tachycardia) (HCC)    Past Surgical History:  Procedure Laterality Date   CORONARY ARTERY BYPASS GRAFT N/A 01/08/2018   Procedure: CORONARY ARTERY BYPASS GRAFTING (CABG) x five, using Left Internal Mammary Artery and Right Leg Greater Saphenous Vein harvested endoscopically;  Surgeon: Loreli Slot, MD;  Location: [facility] OR;  Service: Open Heart Surgery;  Laterality: N/A;   LEFT HEART CATH AND CORONARY ANGIOGRAPHY N/A 01/03/2018   Procedure: LEFT HEART CATH AND CORONARY ANGIOGRAPHY;  Surgeon: Corky Crafts, MD;  Location: [facility] INVASIVE CV LAB;  Service: Cardiovascular;  Laterality: N/A;   SHOULDER SURGERY Left    TEE WITHOUT CARDIOVERSION N/A 01/08/2018   Procedure: TRANSESOPHAGEAL ECHOCARDIOGRAM (TEE);  Surgeon: Loreli Slot, MD;  Location: [facility] OR;  Service: Open Heart Surgery;  Laterality: N/A;   WRIST ARTHROPLASTY Right     Allergies  No Known Allergies   History of Present Illness    Allen Cox. is a 52 y.o. male with PMH of CAD s/p NSTEMI and 2019 treated with CABG x5 with postop VT requiring defib/CPR, DM, ICM, (declined referral for ICD), HLD, HFrEF who presents today for 53-month follow-up.  Allen Cox was originally seen for chest pain in 2014 and was found to have NSTEMI.  He underwent LHC that revealed three-vessel disease that was treated with CABG x5 using internal mammary and right leg saphenous.  He experienced VT postop that required shock/CPR.  He was discharged with  amiodarone and was subsequently discontinued in 2019.  He had a cardiac MRI which showed mildly dilated LV with normal wall thickness and EF of 34%. He elected medical therapy over ICD and most recent 2D echo showed EF of EF 40-45%. He was last seen by Allen Cox on 11/19/2022 and was doing well.  During visit fluid volume was stable and Entresto was increased to 49/51 twice daily and Jardiance was added to current regimen. 2D echo was repeated showing decreased EF of 35-40% unchanged from previous 2D echo. Medication compliance was discussed and enforced with the patient.  He tested positive for COVID on 04/17/2023 and was treated with antibiotics for sinusitis.  Since last being seen in the office patient reports that he has been doing fair with no new cardiac complaints since his previous follow-up.  His blood pressure today is well-controlled at 100/70 and heart rate is stable at 84 bpm.  He is euvolemic with trace lower extremity edema present bilaterally.  He is currently following a low-sodium and heart healthy diet and continues to weigh himself daily.  He is scheduled to undergo his first colonoscopy and presents today for cardiac clearance.  Patient denies chest pain, palpitations, dyspnea, PND, orthopnea, nausea, vomiting, dizziness, syncope, edema, weight gain, or early satiety.  Home Medications    Current Outpatient Medications  Medication Sig Dispense Refill   acetaminophen (TYLENOL) 500 MG tablet Take 1 tablet (500 mg total) by mouth every 6 (six) hours as needed. 30 tablet 0   amoxicillin-clavulanate (AUGMENTIN)  875-125 MG tablet Take 1 tablet by mouth every 12 (twelve) hours. 14 tablet 0   aspirin EC 81 MG tablet Take 1 tablet (81 mg total) by mouth daily. 90 tablet 3   atorvastatin (LIPITOR) 80 MG tablet Take 1 tablet (80 mg total) by mouth at bedtime. 90 tablet 3   blood glucose meter kit and supplies KIT Dispense based on patient and insurance preference. Use up to four times daily as  directed. (FOR ICD-9 250.00, 250.01). 1 each 1   carvedilol (COREG) 12.5 MG tablet Take 1 tablet (12.5 mg total) by mouth 2 (two) times daily. 180 tablet 3   citalopram (CELEXA) 20 MG tablet Take 1 tablet (20 mg total) by mouth daily. 30 tablet 11   clopidogrel (PLAVIX) 75 MG tablet Take 1 tablet (75 mg total) by mouth daily. 90 tablet 3   Dulaglutide 3 MG/0.5ML SOPN Inject 3 mg into the skin once a week. 2 mL 4   empagliflozin (JARDIANCE) 25 MG TABS tablet Take 1 tablet (25 mg total) by mouth daily before breakfast. 30 tablet 11   ezetimibe (ZETIA) 10 MG tablet Take 1 tablet (10 mg total) by mouth daily. 90 tablet 3   famotidine (PEPCID) 40 MG tablet Take 1 tablet (40 mg total) by mouth at bedtime. 30 tablet 6   gabapentin (NEURONTIN) 300 MG capsule Take 1 capsule (300 mg total) by mouth at bedtime. 90 capsule 3   glipiZIDE (GLUCOTROL) 10 MG tablet Take 1 tablet (10 mg total) by mouth 2 (two) times daily before a meal. 60 tablet 11   glucose blood (TRUE METRIX BLOOD GLUCOSE TEST) test strip Check blood glucose twice daily before meals 100 each 11   Lancets MISC Check blood glucose twice daily before meals 100 each 11   metFORMIN (GLUCOPHAGE) 1000 MG tablet Take 1 tablet (1,000 mg total) by mouth 2 (two) times daily with a meal. 60 tablet 11   sacubitril-valsartan (ENTRESTO) 49-51 MG Take 1 tablet by mouth 2 (two) times daily. 180 tablet 3   spironolactone (ALDACTONE) 25 MG tablet Take 0.5 tablets (12.5 mg total) by mouth daily. 45 tablet 3   No current facility-administered medications for this visit.     Review of Systems  Please see the history of present illness.    (+) Trace lower extremity edema   All other systems reviewed and are otherwise negative except as noted above.  Physical Exam    Wt Readings from Last 3 Encounters:  04/29/23 235 lb (106.6 kg)  04/17/23 234 lb (106.1 kg)  04/16/23 234 lb (106.1 kg)   ZO:XWRUE were no vitals filed for this visit.,There is no height or  weight on file to calculate BMI.  Constitutional:      Appearance: Healthy appearance. Not in distress.  Neck:     Vascular: JVD normal.  Pulmonary:     Effort: Pulmonary effort is normal.     Breath sounds: No wheezing. No rales. Diminished in the bases Cardiovascular:     Normal rate. Regular rhythm. Normal S1. Normal S2.      Murmurs: There is no murmur.  Edema:    Peripheral edema absent.  Abdominal:     Palpations: Abdomen is soft non tender. There is no hepatomegaly.  Skin:    General: Skin is warm and dry.  Neurological:     General: No focal deficit present.     Mental Status: Alert and oriented to person, place and time.     Cranial Nerves:  Cranial nerves are intact.  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   Risk Assessment/Calculations:            Lab Results  Component Value Date   WBC 4.0 01/21/2023   HGB 15.2 01/21/2023   HCT 47.2 01/21/2023   HCT 47.2 01/21/2023   MCV 79 01/21/2023   PLT 262 01/21/2023   Lab Results  Component Value Date   CREATININE 0.77 01/21/2023   BUN 7 01/21/2023   NA 139 01/21/2023   K 4.3 01/21/2023   CL 103 01/21/2023   CO2 20 01/21/2023   Lab Results  Component Value Date   ALT 38 01/21/2023   AST 23 01/21/2023   ALKPHOS 142 (H) 01/21/2023   BILITOT 0.6 01/21/2023   Lab Results  Component Value Date   CHOL 114 01/01/2023   HDL 33 (L) 01/01/2023   LDLCALC 66 01/01/2023   TRIG 69 01/01/2023   CHOLHDL 5.5 (H) 11/19/2022    Lab Results  Component Value Date   HGBA1C 7.7 (H) 04/26/2023     Assessment & Plan    1.Chronic systolic heart failure: -Patient's last 2D echo was completed on 12/2022 showing EF of 35-40% similar to previous findings. -Today patient reports compliance with his current medication regimen. -He has trace lower extremity edema  present but denies any shortness of breath or orthopnea. -Continue GDMT with carvedilol 12.5 mg twice daily, Jardiance 25 mg daily, Entresto 49/51 mg twice daily, spironolactone 12.5 mg daily -Patient will repeat 2D echo in January  2.Coronary artery disease: -CAD s/p NSTEMI and 2019 treated with CABG x5 -Today patient reports no chest pain or anginal equivalent since previous visit. -Continue GDMT with ASA 81 mg, Lipitor 80 mg, carvedilol 12.5 mg twice daily, Plavix 75 mg daily, ezetimibe 10 mg daily -Continue low-sodium heart healthy diet  3.Hypertension: -Blood pressure today was controlled at 100/70 -Continue carvedilol 12.5 mg twice daily, spironolactone 12.5 mg daily and Entresto 49/51 mg twice daily  4. Hyperlipidemia: -Patient's last LDL cholesterol was 66 at goal -Continue Lipitor and Zetia as noted above  5.  Obstructive sleep apnea: -Continue compliance with CPAP  6.  DM type II: -Patient/hemoglobin A1c was 7.7 -Continue current treatment plan per PCP  7.  Preoperative clearance: -Patient's RCRI score is 6.6%  The patient affirms he has been doing well without any new cardiac symptoms. They are able to achieve 6 METS without cardiac limitations. Therefore, based on ACC/AHA guidelines, the patient would be at acceptable risk for the planned procedure without further cardiovascular testing. The patient was advised that if he develops new symptoms prior to surgery to contact our office to arrange for a follow-up visit, and he verbalized understanding.   Patient can hold Plavix 5 days prior to procedure and aspirin 7 days prior to procedure and should resume both postprocedure when surgically safe and guided by GI team.  Disposition: Follow-up with Tonny Bollman, MD or APP in  6 months    Medication Adjustments/Labs and Tests Ordered: Current medicines are reviewed at length with the patient today.  Concerns regarding medicines are outlined above.   Signed, Napoleon Form,  Leodis Rains, NP 05/23/2023, 8:00 AM [facility]

## 2023-05-22 NOTE — Progress Notes (Signed)
Left HIPAA compliant VM with call back number for pre op call  COVID Vaccine Completed:  Date of COVID positive in last 90 days: 04/17/23 in ED  PCP - Julieanne Manson, MD Cardiologist - Tonny Bollman, MD LOV 11/19/22  Chest x-ray -  EKG -  Stress Test -  ECHO - 12/03/22 Epic Cardiac Cath - 01/03/18 Epic Pacemaker/ICD device last checked: Spinal Cord Stimulator:  Bowel Prep -   Sleep Study -  CPAP -   Fasting Blood Sugar -  Checks Blood Sugar _____ times a day A1C 7.7 04/26/23 Epic  Last dose of GLP1 agonist- Trulicity, hold 7 days GLP1 instructions:    Jardiance- hold 3 days. Last dose 05/26/23. Glipizide- hold evening dose night before.   Last dose of SGLT-2 inhibitors-   SGLT-2 instructions: N/A   Blood Thinner Instructions:  Plavix, hold 5 days Aspirin Instructions: ASA 81 Last Dose:  Activity level:  Can go up a flight of stairs and perform activities of daily living without stopping and without symptoms of chest pain or shortness of breath.  Able to exercise without symptoms  Unable to go up a flight of stairs without symptoms of     Anesthesia review: NSTEMI, cardiomyopathy, HTN, DM2, CABGx5, OSA, left fascicular block on EKG  Patient denies shortness of breath, fever, cough and chest pain at PAT appointment  Patient verbalized understanding of instructions that were given to them at the PAT appointment. Patient was also instructed that they will need to review over the PAT instructions again at home before surgery.

## 2023-05-24 ENCOUNTER — Telehealth: Payer: Self-pay

## 2023-05-24 ENCOUNTER — Encounter: Payer: Self-pay | Admitting: Nurse Practitioner

## 2023-05-24 ENCOUNTER — Ambulatory Visit: Payer: Medicare Other | Attending: Nurse Practitioner | Admitting: Nurse Practitioner

## 2023-05-24 ENCOUNTER — Telehealth: Payer: Self-pay | Admitting: Gastroenterology

## 2023-05-24 VITALS — BP 100/70 | HR 84 | Ht 72.0 in | Wt 239.4 lb

## 2023-05-24 DIAGNOSIS — E782 Mixed hyperlipidemia: Secondary | ICD-10-CM

## 2023-05-24 DIAGNOSIS — Z01810 Encounter for preprocedural cardiovascular examination: Secondary | ICD-10-CM | POA: Diagnosis present

## 2023-05-24 DIAGNOSIS — G4733 Obstructive sleep apnea (adult) (pediatric): Secondary | ICD-10-CM | POA: Diagnosis present

## 2023-05-24 DIAGNOSIS — I10 Essential (primary) hypertension: Secondary | ICD-10-CM

## 2023-05-24 DIAGNOSIS — I5022 Chronic systolic (congestive) heart failure: Secondary | ICD-10-CM | POA: Diagnosis present

## 2023-05-24 DIAGNOSIS — E119 Type 2 diabetes mellitus without complications: Secondary | ICD-10-CM

## 2023-05-24 DIAGNOSIS — Z951 Presence of aortocoronary bypass graft: Secondary | ICD-10-CM

## 2023-05-24 NOTE — Telephone Encounter (Signed)
Clearance to hold plavix  5 days was given via Cardiology office note 05/24/23

## 2023-05-24 NOTE — Telephone Encounter (Signed)
Clydie Braun with Cardiology is calling to have the Plavix clearance faxed so we can have that for his procedure here on the 26th. He has an appointment with them today. Please fax to [phone] attn: Clydie Braun

## 2023-05-24 NOTE — Progress Notes (Signed)
Left HIPAA compliant VM with call back number for pre op call.  COVID Vaccine Completed:  Date of COVID positive in last 90 days: 04/17/23 in ED  PCP - Julieanne Manson, MD Cardiologist - Tonny Bollman, MD LOV 11/19/22 LOV 05-24-23  for cardiac clearance with Robin Searing, NP  Chest x-ray -  EKG - 05-24-23 Stress Test -  ECHO - 12/03/22 Epic, 05-24-23 Cardiac Cath - 01/03/18 Epic Pacemaker/ICD device last checked: Spinal Cord Stimulator:  Bowel Prep -   Sleep Study -  CPAP -   Fasting Blood Sugar -  Checks Blood Sugar _____ times a day A1C 7.7 04/26/23 Epic  Last dose of GLP1 agonist- Trulicity, hold 7 days GLP1 instructions:    Jardiance- hold 3 days. Last dose 05/26/23. Glipizide- hold evening dose night before.   Last dose of SGLT-2 inhibitors-   SGLT-2 instructions: N/A   Blood Thinner Instructions:  Plavix, hold 5 days Aspirin Instructions: ASA 81  hold 7 days Last Dose:  Activity level:  Can go up a flight of stairs and perform activities of daily living without stopping and without symptoms of chest pain or shortness of breath.  Able to exercise without symptoms  Unable to go up a flight of stairs without symptoms of     Anesthesia review: NSTEMI, cardiomyopathy, HTN, DM2, CABGx5, OSA, left fascicular block on EKG, CHF  Patient denies shortness of breath, fever, cough and chest pain at PAT appointment  Patient verbalized understanding of instructions that were given to them at the PAT appointment. Patient was also instructed that they will need to review over the PAT instructions again at home before surgery.

## 2023-05-24 NOTE — Patient Instructions (Addendum)
Medication Instructions:  Your physician recommends that you continue on your current medications as directed. Please refer to the Current Medication list given to you today.  *If you need a refill on your cardiac medications before your next appointment, please call your pharmacy*   Lab Work: None  If you have labs (blood work) drawn today and your tests are completely normal, you will receive your results only by: MyChart Message (if you have MyChart) OR A paper copy in the mail If you have any lab test that is abnormal or we need to change your treatment, we will call you to review the results.   Testing/Procedures: Your physician has requested that you have an echocardiogram in Janury 2025. Echocardiography is a painless test that uses sound waves to create images of your heart. It provides your doctor with information about the size and shape of your heart and how well your heart's chambers and valves are working. This procedure takes approximately one hour. There are no restrictions for this procedure. Please do NOT wear cologne, aftershave, or lotions (deodorant is allowed). Please arrive 15 minutes prior to your appointment time.    Follow-Up: At [facility], you and your health needs are our priority.  As part of our continuing mission to provide you with exceptional heart care, we have created designated Provider Care Teams.  These Care Teams include your primary Cardiologist (physician) and Advanced Practice Providers (APPs -  Physician Assistants and Nurse Practitioners) who all work together to provide you with the care you need, when you need it.  We recommend signing up for the patient portal called "MyChart".  Sign up information is provided on this After Visit Summary.  MyChart is used to connect with patients for Virtual Visits (Telemedicine).  Patients are able to view lab/test results, encounter notes, upcoming appointments, etc.  Non-urgent messages can be sent  to your provider as well.   To learn more about what you can do with MyChart, go to [email or web address].    Your next appointment:   6 month(s)  Provider:   Robin Searing, NP         Other Instructions Low-Sodium Eating Plan Sodium, which is an element that makes up salt, helps you maintain a healthy balance of fluids in your body. Too much sodium can increase your blood pressure and cause fluid and waste to be held in your body. Your health care provider or dietitian may recommend following this plan if you have high blood pressure (hypertension), kidney disease, liver disease, or heart failure. Eating less sodium can help lower your blood pressure, reduce swelling, and protect your heart, liver, and kidneys. What are tips for following this plan? General guidelines Most people on this plan should limit their sodium intake to 1,500-2,000 mg (milligrams) of sodium each day. Reading food labels  The Nutrition Facts label lists the amount of sodium in one serving of the food. If you eat more than one serving, you must multiply the listed amount of sodium by the number of servings. Choose foods with less than 140 mg of sodium per serving. Avoid foods with 300 mg of sodium or more per serving. Shopping Look for lower-sodium products, often labeled as "low-sodium" or "no salt added." Always check the sodium content even if foods are labeled as "unsalted" or "no salt added". Buy fresh foods. Avoid canned foods and premade or frozen meals. Avoid canned, cured, or processed meats Buy breads that have less than 80 mg of sodium per  slice. Cooking Eat more home-cooked food and less restaurant, buffet, and fast food. Avoid adding salt when cooking. Use salt-free seasonings or herbs instead of table salt or sea salt. Check with your health care provider or pharmacist before using salt substitutes. Cook with plant-based oils, such as canola, sunflower, or olive oil. Meal planning When eating at  a restaurant, ask that your food be prepared with less salt or no salt, if possible. Avoid foods that contain MSG (monosodium glutamate). MSG is sometimes added to [place] food, bouillon, and some canned foods. What foods are recommended? The items listed may not be a complete list. Talk with your dietitian about what dietary choices are best for you. Grains Low-sodium cereals, including oats, puffed wheat and rice, and shredded wheat. Low-sodium crackers. Unsalted rice. Unsalted pasta. Low-sodium bread. Whole-grain breads and whole-grain pasta. Vegetables Fresh or frozen vegetables. "No salt added" canned vegetables. "No salt added" tomato sauce and paste. Low-sodium or reduced-sodium tomato and vegetable juice. Fruits Fresh, frozen, or canned fruit. Fruit juice. Meats and other protein foods Fresh or frozen (no salt added) meat, poultry, seafood, and fish. Low-sodium canned tuna and salmon. Unsalted nuts. Dried peas, beans, and lentils without added salt. Unsalted canned beans. Eggs. Unsalted nut butters. Dairy Milk. Soy milk. Cheese that is naturally low in sodium, such as ricotta cheese, fresh mozzarella, or Swiss cheese Low-sodium or reduced-sodium cheese. Cream cheese. Yogurt. Fats and oils Unsalted butter. Unsalted margarine with no trans fat. Vegetable oils such as canola or olive oils. Seasonings and other foods Fresh and dried herbs and spices. Salt-free seasonings. Low-sodium mustard and ketchup. Sodium-free salad dressing. Sodium-free light mayonnaise. Fresh or refrigerated horseradish. Lemon juice. Vinegar. Homemade, reduced-sodium, or low-sodium soups. Unsalted popcorn and pretzels. Low-salt or salt-free chips. What foods are not recommended? The items listed may not be a complete list. Talk with your dietitian about what dietary choices are best for you. Grains Instant hot cereals. Bread stuffing, pancake, and biscuit mixes. Croutons. Seasoned rice or pasta mixes. Noodle soup  cups. Boxed or frozen macaroni and cheese. Regular salted crackers. Self-rising flour. Vegetables Sauerkraut, pickled vegetables, and relishes. Olives. [place] fries. Onion rings. Regular canned vegetables (not low-sodium or reduced-sodium). Regular canned tomato sauce and paste (not low-sodium or reduced-sodium). Regular tomato and vegetable juice (not low-sodium or reduced-sodium). Frozen vegetables in sauces. Meats and other protein foods Meat or fish that is salted, canned, smoked, spiced, or pickled. Bacon, ham, sausage, hotdogs, corned beef, chipped beef, packaged lunch meats, salt pork, jerky, pickled herring, anchovies, regular canned tuna, sardines, salted nuts. Dairy Processed cheese and cheese spreads. Cheese curds. Blue cheese. Feta cheese. String cheese. Regular cottage cheese. Buttermilk. Canned milk. Fats and oils Salted butter. Regular margarine. Ghee. Bacon fat. Seasonings and other foods Onion salt, garlic salt, seasoned salt, table salt, and sea salt. Canned and packaged gravies. Worcestershire sauce. Tartar sauce. Barbecue sauce. Teriyaki sauce. Soy sauce, including reduced-sodium. Steak sauce. Fish sauce. Oyster sauce. Cocktail sauce. Horseradish that you find on the shelf. Regular ketchup and mustard. Meat flavorings and tenderizers. Bouillon cubes. Hot sauce and Tabasco sauce. Premade or packaged marinades. Premade or packaged taco seasonings. Relishes. Regular salad dressings. Salsa. Potato and tortilla chips. Corn chips and puffs. Salted popcorn and pretzels. Canned or dried soups. Pizza. Frozen entrees and pot pies. Summary Eating less sodium can help lower your blood pressure, reduce swelling, and protect your heart, liver, and kidneys. Most people on this plan should limit their sodium intake to 1,500-2,000 mg (milligrams) of  sodium each day. Canned, boxed, and frozen foods are high in sodium. Restaurant foods, fast foods, and pizza are also very high in sodium. You also get  sodium by adding salt to food. Try to cook at home, eat more fresh fruits and vegetables, and eat less fast food, canned, processed, or prepared foods. This information is not intended to replace advice given to you by your health care provider. Make sure you discuss any questions you have with your health care provider. Document Revised: 08/02/2017 Document Reviewed: 08/13/2016 Elsevier Patient Education  2020 Elsevier Inc.   Heart-Healthy Eating Plan Many factors influence your heart health, including eating and exercise habits. Heart health is also called coronary health. Coronary risk increases with abnormal blood fat (lipid) levels. A heart-healthy eating plan includes limiting unhealthy fats, increasing healthy fats, limiting salt (sodium) intake, and making other diet and lifestyle changes. What is my plan? Your health care provider may recommend that: You limit your fat intake to _________% or less of your total calories each day. You limit your saturated fat intake to _________% or less of your total calories each day. You limit the amount of cholesterol in your diet to less than _________ mg per day. You limit the amount of sodium in your diet to less than _________ mg per day. What are tips for following this plan? Cooking Cook foods using methods other than frying. Baking, boiling, grilling, and broiling are all good options. Other ways to reduce fat include: Removing the skin from poultry. Removing all visible fats from meats. Steaming vegetables in water or broth. Meal planning  At meals, imagine dividing your plate into fourths: Fill one-half of your plate with vegetables and green salads. Fill one-fourth of your plate with whole grains. Fill one-fourth of your plate with lean protein foods. Eat 2-4 cups of vegetables per day. One cup of vegetables equals 1 cup (91 g) broccoli or cauliflower florets, 2 medium carrots, 1 large bell pepper, 1 large sweet potato, 1 large  tomato, 1 medium white potato, 2 cups (150 g) raw leafy greens. Eat 1-2 cups of fruit per day. One cup of fruit equals 1 small apple, 1 large banana, 1 cup (237 g) mixed fruit, 1 large orange,  cup (82 g) dried fruit, 1 cup (240 mL) 100% fruit juice. Eat more foods that contain soluble fiber. Examples include apples, broccoli, carrots, beans, peas, and barley. Aim to get 25-30 g of fiber per day. Increase your consumption of legumes, nuts, and seeds to 4-5 servings per week. One serving of dried beans or legumes equals  cup (90 g) cooked, 1 serving of nuts is  oz (12 almonds, 24 pistachios, or 7 walnut halves), and 1 serving of seeds equals  oz (8 g). Fats Choose healthy fats more often. Choose monounsaturated and polyunsaturated fats, such as olive and canola oils, avocado oil, flaxseeds, walnuts, almonds, and seeds. Eat more omega-3 fats. Choose salmon, mackerel, sardines, tuna, flaxseed oil, and ground flaxseeds. Aim to eat fish at least 2 times each week. Check food labels carefully to identify foods with trans fats or high amounts of saturated fat. Limit saturated fats. These are found in animal products, such as meats, butter, and cream. Plant sources of saturated fats include palm oil, palm kernel oil, and coconut oil. Avoid foods with partially hydrogenated oils in them. These contain trans fats. Examples are stick margarine, some tub margarines, cookies, crackers, and other baked goods. Avoid fried foods. General information Eat more home-cooked food and  less restaurant, buffet, and fast food. Limit or avoid alcohol. Limit foods that are high in added sugar and simple starches such as foods made using white refined flour (white breads, pastries, sweets). Lose weight if you are overweight. Losing just 5-10% of your body weight can help your overall health and prevent diseases such as diabetes and heart disease. Monitor your sodium intake, especially if you have high blood pressure. Talk  with your health care provider about your sodium intake. Try to incorporate more vegetarian meals weekly. What foods should I eat? Fruits All fresh, canned (in natural juice), or frozen fruits. Vegetables Fresh or frozen vegetables (raw, steamed, roasted, or grilled). Green salads. Grains Most grains. Choose whole wheat and whole grains most of the time. Rice and pasta, including brown rice and pastas made with whole wheat. Meats and other proteins Lean, well-trimmed beef, veal, pork, and lamb. Chicken and [place] without skin. All fish and shellfish. Wild duck, rabbit, pheasant, and venison. Egg whites or low-cholesterol egg substitutes. Dried beans, peas, lentils, and tofu. Seeds and most nuts. Dairy Low-fat or nonfat cheeses, including ricotta and mozzarella. Skim or 1% milk (liquid, powdered, or evaporated). Buttermilk made with low-fat milk. Nonfat or low-fat yogurt. Fats and oils Non-hydrogenated (trans-free) margarines. Vegetable oils, including soybean, sesame, sunflower, olive, avocado, peanut, safflower, corn, canola, and cottonseed. Salad dressings or mayonnaise made with a vegetable oil. Beverages Water (mineral or sparkling). Coffee and tea. Unsweetened ice tea. Diet beverages. Sweets and desserts Sherbet, gelatin, and fruit ice. Small amounts of dark chocolate. Limit all sweets and desserts. Seasonings and condiments All seasonings and condiments. The items listed above may not be a complete list of foods and beverages you can eat. Contact a dietitian for more options. What foods should I avoid? Fruits Canned fruit in heavy syrup. Fruit in cream or butter sauce. Fried fruit. Limit coconut. Vegetables Vegetables cooked in cheese, cream, or butter sauce. Fried vegetables. Grains Breads made with saturated or trans fats, oils, or whole milk. Croissants. Sweet rolls. Donuts. High-fat crackers, such as cheese crackers and chips. Meats and other proteins Fatty meats, such as hot  dogs, ribs, sausage, bacon, rib-eye roast or steak. High-fat deli meats, such as salami and bologna. Caviar. Domestic duck and goose. Organ meats, such as liver. Dairy Cream, sour cream, cream cheese, and creamed cottage cheese. Whole-milk cheeses. Whole or 2% milk (liquid, evaporated, or condensed). Whole buttermilk. Cream sauce or high-fat cheese sauce. Whole-milk yogurt. Fats and oils Meat fat, or shortening. Cocoa butter, hydrogenated oils, palm oil, coconut oil, palm kernel oil. Solid fats and shortenings, including bacon fat, salt pork, lard, and butter. Nondairy cream substitutes. Salad dressings with cheese or sour cream. Beverages Regular sodas and any drinks with added sugar. Sweets and desserts Frosting. Pudding. Cookies. Cakes. Pies. Milk chocolate or white chocolate. Buttered syrups. Full-fat ice cream or ice cream drinks. The items listed above may not be a complete list of foods and beverages to avoid. Contact a dietitian for more information. Summary Heart-healthy meal planning includes limiting unhealthy fats, increasing healthy fats, limiting salt (sodium) intake and making other diet and lifestyle changes. Lose weight if you are overweight. Losing just 5-10% of your body weight can help your overall health and prevent diseases such as diabetes and heart disease. Focus on eating a balance of foods, including fruits and vegetables, low-fat or nonfat dairy, lean protein, nuts and legumes, whole grains, and heart-healthy oils and fats. This information is not intended to replace advice given to  you by your health care provider. Make sure you discuss any questions you have with your health care provider. Document Revised: 09/25/2021 Document Reviewed: 09/25/2021 Elsevier Patient Education  2024 Elsevier Inc.  Daily Weight Record It is important to weigh yourself daily. To do this: Make sure you use a reliable scale. Use the same scale each day. Keep this daily weight chart near  your scale. Weigh yourself each morning at the same time after you use the bathroom. Before weighing yourself: Take off your shoes. Make sure you are wearing the same amount of clothing each day. Write down your weight in the spaces on the form. Compare today's weight to yesterday's weight. Bring this form with you to your follow-up visits with your health care provider. Call your health care provider if you have concerns about your weight, including rapid weight gain or loss. Date: ________ Weight: ____________________ Date: ________ Weight: ____________________ Date: ________ Weight: ____________________ Date: ________ Weight: ____________________ Date: ________ Weight: ____________________ Date: ________ Weight: ____________________ Date: ________ Weight: ____________________ Date: ________ Weight: ____________________ Date: ________ Weight: ____________________ Date: ________ Weight: ____________________ Date: ________ Weight: ____________________ Date: ________ Weight: ____________________ Date: ________ Weight: ____________________ Date: ________ Weight: ____________________ Date: ________ Weight: ____________________ Date: ________ Weight: ____________________ Date: ________ Weight: ____________________ Date: ________ Weight: ____________________ Date: ________ Weight: ____________________ Date: ________ Weight: ____________________ Date: ________ Weight: ____________________ Date: ________ Weight: ____________________ Date: ________ Weight: ____________________ Date: ________ Weight: ____________________ Date: ________ Weight: ____________________ Date: ________ Weight: ____________________ Date: ________ Weight: ____________________ Date: ________ Weight: ____________________ Date: ________ Weight: ____________________ Date: ________ Weight: ____________________ Date: ________ Weight: ____________________ Date: ________ Weight: ____________________ Date: ________ Weight:  ____________________ Date: ________ Weight: ____________________ Date: ________ Weight: ____________________ Date: ________ Weight: ____________________ Date: ________ Weight: ____________________ Date: ________ Weight: ____________________ Date: ________ Weight: ____________________ Date: ________ Weight: ____________________ Date: ________ Weight: ____________________ Date: ________ Weight: ____________________ Date: ________ Weight: ____________________ Date: ________ Weight: ____________________ Date: ________ Weight: ____________________ Date: ________ Weight: ____________________ Date: ________ Weight: ____________________ Date: ________ Weight: ____________________ Date: ________ Weight: ____________________ Date: ________ Weight: ____________________ This information is not intended to replace advice given to you by your health care provider. Make sure you discuss any questions you have with your health care provider. Document Revised: 04/25/2021 Document Reviewed: 04/25/2021 Elsevier Patient Education  2024 [employer].

## 2023-05-24 NOTE — Telephone Encounter (Signed)
error 

## 2023-05-24 NOTE — Telephone Encounter (Deleted)
Allen Cox. 25-May-1971 [id number]  Dear ***:  We have scheduled the above named patient for a(n) *** procedure. Our records show that (s)he is on anticoagulation therapy.  Please advise as to whether the patient may come off their therapy of *** *** days prior to their procedure which is scheduled for ***.  Please route your response to *** or fax response to [phone].  Sincerely,    [facility]

## 2023-05-27 NOTE — Telephone Encounter (Signed)
Inbound all fro patient requesting to cancel upcoming procedure.   Patient needs to be scheduled for a Monday or Tuesday.   Please advise. Thank you

## 2023-05-28 ENCOUNTER — Encounter: Payer: Self-pay | Admitting: Podiatry

## 2023-05-28 ENCOUNTER — Ambulatory Visit (INDEPENDENT_AMBULATORY_CARE_PROVIDER_SITE_OTHER): Payer: Medicare Other | Admitting: Podiatry

## 2023-05-28 DIAGNOSIS — M2041 Other hammer toe(s) (acquired), right foot: Secondary | ICD-10-CM | POA: Diagnosis not present

## 2023-05-28 DIAGNOSIS — E1142 Type 2 diabetes mellitus with diabetic polyneuropathy: Secondary | ICD-10-CM

## 2023-05-28 DIAGNOSIS — M2042 Other hammer toe(s) (acquired), left foot: Secondary | ICD-10-CM

## 2023-05-28 DIAGNOSIS — E119 Type 2 diabetes mellitus without complications: Secondary | ICD-10-CM

## 2023-05-28 NOTE — Telephone Encounter (Signed)
Patient procedure has been cancelled for 05/30/23

## 2023-05-28 NOTE — Telephone Encounter (Signed)
Maya, please see note below. Thanks

## 2023-05-28 NOTE — Telephone Encounter (Signed)
Inbound call from patient returning phone call. Patient requesting a call back. Please advise, thank you.

## 2023-05-28 NOTE — Telephone Encounter (Signed)
Left VM for patient to return call to discuss dates for Colonoscopy.

## 2023-05-28 NOTE — Telephone Encounter (Signed)
Left VM for patient letting him know that Dr.Cunningham has opening Monday November 25th and Tuesday December 10

## 2023-05-28 NOTE — Progress Notes (Signed)
Subjective:  Patient ID: Allen Riches., male    DOB: [birth year],   MRN: [id number]  Chief Complaint  Patient presents with   Numbness    52 y.o. male presents for  diabetic foot exam. Relates numbness in the feet. Relates burning and tingling in their feet. Patient is diabetic and last A1c was  Lab Results  Component Value Date   HGBA1C 7.7 (H) 04/26/2023   .   PCP:  Julieanne Manson, MD   . Denies any other pedal complaints. Denies n/v/f/c.   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 CFT <3 seconds. Absent hair growth on digits. Edema noted to bilateral lower extremities. Xerosis noted bilaterally.  Skin. No lacerations or abrasions bilateral feet. Nails 1-5 bilateral  are normal in appearance.  Musculoskeletal: MMT 5/5 bilateral lower extremities in DF, PF, Inversion and Eversion. Deceased ROM in DF of ankle joint.  Neurological: Sensation intact to light touch. Protective sensation diminished bilateral.    Assessment:   1. Type 2 diabetes mellitus with diabetic polyneuropathy, without long-term current use of insulin (HCC)   2. Encounter for diabetic foot exam (HCC)   3. Hammertoe, bilateral      Plan:  Patient was evaluated and treated and all questions answered. -Discussed and educated patient on diabetic foot care, especially with  regards to the vascular, neurological and musculoskeletal systems.  -Stressed the importance of good glycemic control and the detriment of not  controlling glucose levels in relation to the foot. -Discussed supportive shoes at all times and checking feet regularly.  -Will get schedule for DM shoe fitting.  -Answered all patient  questions -Patient to return  in 1 year for DM foot check  -Patient advised to call the office if any problems or questions arise in the meantime.   Louann Sjogren, DPM

## 2023-05-30 ENCOUNTER — Encounter (HOSPITAL_COMMUNITY): Admission: RE | Payer: Self-pay | Source: Home / Self Care

## 2023-05-30 ENCOUNTER — Ambulatory Visit (HOSPITAL_COMMUNITY): Admission: RE | Admit: 2023-05-30 | Payer: Medicare Other | Source: Home / Self Care | Admitting: Gastroenterology

## 2023-05-30 SURGERY — COLONOSCOPY WITH PROPOFOL
Anesthesia: Monitor Anesthesia Care

## 2023-06-10 ENCOUNTER — Ambulatory Visit (INDEPENDENT_AMBULATORY_CARE_PROVIDER_SITE_OTHER): Payer: Medicare Other | Admitting: Internal Medicine

## 2023-06-10 ENCOUNTER — Encounter: Payer: Self-pay | Admitting: Internal Medicine

## 2023-06-10 VITALS — BP 102/70 | HR 84 | Resp 16 | Ht 72.0 in | Wt 238.0 lb

## 2023-06-10 DIAGNOSIS — G4700 Insomnia, unspecified: Secondary | ICD-10-CM | POA: Diagnosis not present

## 2023-06-10 DIAGNOSIS — E66811 Obesity, class 1: Secondary | ICD-10-CM

## 2023-06-10 DIAGNOSIS — E1165 Type 2 diabetes mellitus with hyperglycemia: Secondary | ICD-10-CM

## 2023-06-10 DIAGNOSIS — F515 Nightmare disorder: Secondary | ICD-10-CM | POA: Insufficient documentation

## 2023-06-10 MED ORDER — TRUE METRIX BLOOD GLUCOSE TEST VI STRP
ORAL_STRIP | 11 refills | Status: DC
Start: 2023-06-10 — End: 2023-07-25

## 2023-06-10 NOTE — Progress Notes (Signed)
    Subjective:    Patient ID: Allen Cox., male   DOB: 1971/02/06, 52 y.o.   MRN: [id number]   HPI   DM/obesity:  only down 1 lb since last check, but up a total of 3 lbs from August.  Initiated Trulicity in June and A1C decreased to 7.7% in August.  Increased his Trulicity to 3.0 mg weekly then. He is not keeping track of sugars as out of strips.  He should have refills.  He is not having side effects of increased dose of Trulicity.  Current Meds  Medication Sig   acetaminophen (TYLENOL) 500 MG tablet Take 1 tablet (500 mg total) by mouth every 6 (six) hours as needed.   aspirin EC 81 MG tablet Take 1 tablet (81 mg total) by mouth daily.   atorvastatin (LIPITOR) 80 MG tablet Take 1 tablet (80 mg total) by mouth at bedtime.   blood glucose meter kit and supplies KIT Dispense based on patient and insurance preference. Use up to four times daily as directed. (FOR ICD-9 250.00, 250.01).   carvedilol (COREG) 12.5 MG tablet Take 1 tablet (12.5 mg total) by mouth 2 (two) times daily.   citalopram (CELEXA) 20 MG tablet Take 1 tablet (20 mg total) by mouth daily.   clopidogrel (PLAVIX) 75 MG tablet Take 1 tablet (75 mg total) by mouth daily.   Dulaglutide 3 MG/0.5ML SOPN Inject 3 mg into the skin once a week.   empagliflozin (JARDIANCE) 25 MG TABS tablet Take 1 tablet (25 mg total) by mouth daily before breakfast.   ezetimibe (ZETIA) 10 MG tablet Take 1 tablet (10 mg total) by mouth daily.   famotidine (PEPCID) 40 MG tablet Take 1 tablet (40 mg total) by mouth at bedtime.   gabapentin (NEURONTIN) 300 MG capsule Take 1 capsule (300 mg total) by mouth at bedtime.   glipiZIDE (GLUCOTROL) 10 MG tablet Take 1 tablet (10 mg total) by mouth 2 (two) times daily before a meal.   glucose blood (TRUE METRIX BLOOD GLUCOSE TEST) test strip Check blood glucose twice daily before meals   Lancets MISC Check blood glucose twice daily before meals   metFORMIN (GLUCOPHAGE) 1000 MG tablet Take 1 tablet (1,000  mg total) by mouth 2 (two) times daily with a meal.   sacubitril-valsartan (ENTRESTO) 49-51 MG Take 1 tablet by mouth 2 (two) times daily.   spironolactone (ALDACTONE) 25 MG tablet Take 0.5 tablets (12.5 mg total) by mouth daily.   No Known Allergies   Review of Systems    Objective:   BP 102/70 (BP Location: Right Arm, Patient Position: Sitting, Cuff Size: Normal)   Pulse 84   Resp 16   Ht 6' (1.829 m)   Wt 238 lb (108 kg)   BMI 32.28 kg/m   Physical Exam NAD Lungs:  CTA CV:  RRR without murmur or rub.  Radial and DP pulses normal and equal LE:  No edema  Assessment & Plan   DM:  improving control, though not quite at goal and would like to wean Glipizide.  Trulicity dose to remain at 3.0  sent in new Rx for strips to check glucose before breakfast and dinner. Follow up end of November with A1C and with me subsequently.    2.  HM:  refused all recommended vaccines:  influenza, COVID, pneumococcal, shingrix,

## 2023-07-05 ENCOUNTER — Telehealth: Payer: Self-pay

## 2023-07-05 ENCOUNTER — Other Ambulatory Visit: Payer: Self-pay

## 2023-07-05 DIAGNOSIS — Z1211 Encounter for screening for malignant neoplasm of colon: Secondary | ICD-10-CM

## 2023-07-05 DIAGNOSIS — D126 Benign neoplasm of colon, unspecified: Secondary | ICD-10-CM | POA: Insufficient documentation

## 2023-07-05 HISTORY — DX: Benign neoplasm of colon, unspecified: D12.6

## 2023-07-05 NOTE — Telephone Encounter (Signed)
Contacted patient and he has been scheduled for a Colonoscopy at [facility] 07/29/23 @ 11:15am. Patient was also scheduled for a pre-visit to go over instructions for prep.

## 2023-07-05 NOTE — Telephone Encounter (Signed)
Left VM for patient to return call to schedule colonoscopy at the hospital.

## 2023-07-18 ENCOUNTER — Ambulatory Visit: Payer: Medicare Other

## 2023-07-18 VITALS — Ht 72.0 in | Wt 230.0 lb

## 2023-07-18 DIAGNOSIS — Z1211 Encounter for screening for malignant neoplasm of colon: Secondary | ICD-10-CM

## 2023-07-18 MED ORDER — NA SULFATE-K SULFATE-MG SULF 17.5-3.13-1.6 GM/177ML PO SOLN: 1.0000 | Freq: Once | ORAL | 0 refills | Status: AC

## 2023-07-18 NOTE — Progress Notes (Addendum)
No egg or soy allergy known to patient  No issues known to pt with past sedation with any surgeries or procedures Patient denies ever being told they had issues or difficulty with intubation  No FH of Malignant Hyperthermia Pt is not on diet pills Pt is not on  home 02  Pt is on blood thinners must stop 7 days before colonoscopy Pt denies issues with constipation  No A fib or A flutter Have any cardiac testing pending--no Pt can ambulate independently Pt denies use of chewing tobacco Discussed diabetic I weight loss medication holds Discussed NSAID holds Checked BMI

## 2023-07-22 ENCOUNTER — Encounter (HOSPITAL_COMMUNITY): Payer: Self-pay | Admitting: Gastroenterology

## 2023-07-22 NOTE — Progress Notes (Signed)
Attempted to obtain medical history for pre op call via telephone, unable to reach at this time. HIPAA compliant voicemail message left requesting return call to pre surgical testing department.

## 2023-07-25 ENCOUNTER — Other Ambulatory Visit: Payer: Self-pay

## 2023-07-25 DIAGNOSIS — E1165 Type 2 diabetes mellitus with hyperglycemia: Secondary | ICD-10-CM

## 2023-07-25 MED ORDER — TRUE METRIX BLOOD GLUCOSE TEST VI STRP: ORAL_STRIP | 11 refills | Status: AC

## 2023-07-28 ENCOUNTER — Telehealth: Payer: Self-pay | Admitting: Physician Assistant

## 2023-07-28 NOTE — Telephone Encounter (Signed)
Patient called earlier today stating that he was scheduled for colonoscopy tomorrow morning with Dr. Tomasa Rand.  He was concerned because he had eaten about half of a cup of nuts yesterday before he remembered that he was post to be limiting his diet  Asking if he needed to cancel procedure.  He has been very adherent with the diet since, has been on liquids all day today, does not generally have any problems with constipation etc.  Advised she should be okay to proceed with procedure tomorrow.

## 2023-07-29 ENCOUNTER — Ambulatory Visit (HOSPITAL_COMMUNITY): Payer: Medicare Other | Admitting: Anesthesiology

## 2023-07-29 ENCOUNTER — Encounter (HOSPITAL_COMMUNITY): Payer: Self-pay | Admitting: Gastroenterology

## 2023-07-29 ENCOUNTER — Other Ambulatory Visit: Payer: Medicare Other

## 2023-07-29 ENCOUNTER — Ambulatory Visit (HOSPITAL_COMMUNITY)
Admission: RE | Admit: 2023-07-29 | Discharge: 2023-07-29 | Disposition: A | Discharge status: Home / Self Care | Payer: Medicare Other | Attending: Gastroenterology | Admitting: Gastroenterology

## 2023-07-29 ENCOUNTER — Encounter (HOSPITAL_COMMUNITY)
Admission: RE | Disposition: A | Discharge status: Home / Self Care | Payer: Self-pay | Source: Home / Self Care | Attending: Gastroenterology

## 2023-07-29 DIAGNOSIS — Z87891 Personal history of nicotine dependence: Secondary | ICD-10-CM | POA: Diagnosis not present

## 2023-07-29 DIAGNOSIS — D122 Benign neoplasm of ascending colon: Secondary | ICD-10-CM | POA: Insufficient documentation

## 2023-07-29 DIAGNOSIS — K644 Residual hemorrhoidal skin tags: Secondary | ICD-10-CM | POA: Insufficient documentation

## 2023-07-29 DIAGNOSIS — K640 First degree hemorrhoids: Secondary | ICD-10-CM | POA: Diagnosis not present

## 2023-07-29 DIAGNOSIS — E119 Type 2 diabetes mellitus without complications: Secondary | ICD-10-CM | POA: Diagnosis not present

## 2023-07-29 DIAGNOSIS — Z7985 Long-term (current) use of injectable non-insulin antidiabetic drugs: Secondary | ICD-10-CM | POA: Insufficient documentation

## 2023-07-29 DIAGNOSIS — Z1211 Encounter for screening for malignant neoplasm of colon: Secondary | ICD-10-CM

## 2023-07-29 DIAGNOSIS — Z7984 Long term (current) use of oral hypoglycemic drugs: Secondary | ICD-10-CM | POA: Diagnosis not present

## 2023-07-29 HISTORY — PX: POLYPECTOMY: SHX5525

## 2023-07-29 HISTORY — PX: COLONOSCOPY WITH PROPOFOL: SHX5780

## 2023-07-29 LAB — GLUCOSE, CAPILLARY: Glucose-Capillary: 85 mg/dL (ref 70–99)

## 2023-07-29 SURGERY — COLONOSCOPY WITH PROPOFOL
Anesthesia: Monitor Anesthesia Care

## 2023-07-29 MED ORDER — PROPOFOL 10 MG/ML IV BOLUS
INTRAVENOUS | Status: DC | PRN
  Administered 2023-07-29: 50 mg via INTRAVENOUS

## 2023-07-29 MED ORDER — SODIUM CHLORIDE 0.9 % IV SOLN: INTRAVENOUS | Status: DC

## 2023-07-29 MED ORDER — PROPOFOL 500 MG/50ML IV EMUL
INTRAVENOUS | Status: DC | PRN
  Administered 2023-07-29: 225 ug/kg/min via INTRAVENOUS

## 2023-07-29 MED ORDER — LIDOCAINE 2% (20 MG/ML) 5 ML SYRINGE
INTRAMUSCULAR | Status: DC | PRN
  Administered 2023-07-29: 100 mg via INTRAVENOUS

## 2023-07-29 SURGICAL SUPPLY — 20 items

## 2023-07-29 NOTE — Discharge Instructions (Signed)

## 2023-07-29 NOTE — H&P (Signed)
[facility] History and Physical   Primary Care Physician:  Julieanne Manson, MD   Reason for Procedure:   Colon cancer screening  Plan:    Screening colonoscopy     HPI: Allen L Lacy Countess. is a 52 y.o. male undergoing initial average risk screening colonoscopy.  He has no family history of colon cancer and no chronic GI symptoms.   Colonoscopy performed in hospital setting due to low ejection fraction   Past Medical History:  Diagnosis Date   Anxiety    CAD (coronary artery disease) of bypass graft    DM (diabetes mellitus) (HCC)    HLD (hyperlipidemia)    Ischemic cardiomyopathy    Primary hypertension 10/08/2021   Tobacco use    VT (ventricular tachycardia) (HCC)     Past Surgical History:  Procedure Laterality Date   CORONARY ARTERY BYPASS GRAFT N/A 01/08/2018   Procedure: CORONARY ARTERY BYPASS GRAFTING (CABG) x five, using Left Internal Mammary Artery and Right Leg Greater Saphenous Vein harvested endoscopically;  Surgeon: Loreli Slot, MD;  Location: [facility] OR;  Service: Open Heart Surgery;  Laterality: N/A;   LEFT HEART CATH AND CORONARY ANGIOGRAPHY N/A 01/03/2018   Procedure: LEFT HEART CATH AND CORONARY ANGIOGRAPHY;  Surgeon: Corky Crafts, MD;  Location: [facility] INVASIVE CV LAB;  Service: Cardiovascular;  Laterality: N/A;   SHOULDER SURGERY Left    TEE WITHOUT CARDIOVERSION N/A 01/08/2018   Procedure: TRANSESOPHAGEAL ECHOCARDIOGRAM (TEE);  Surgeon: Loreli Slot, MD;  Location: [facility] OR;  Service: Open Heart Surgery;  Laterality: N/A;   WRIST ARTHROPLASTY Right     Prior to Admission medications   Medication Sig Start Date End Date Taking? Authorizing Provider  acetaminophen (TYLENOL) 500 MG tablet Take 1 tablet (500 mg total) by mouth every 6 (six) hours as needed. 04/17/23   Fayrene Helper, PA-C  aspirin EC 81 MG tablet Take 1 tablet (81 mg total) by mouth daily. 01/30/18   Bhagat, Sharrell Ku, PA  atorvastatin (LIPITOR) 80 MG tablet  Take 1 tablet (80 mg total) by mouth at bedtime. 11/19/22   Tonny Bollman, MD  carvedilol (COREG) 12.5 MG tablet Take 1 tablet (12.5 mg total) by mouth 2 (two) times daily. 11/19/22 11/14/23  Tonny Bollman, MD  citalopram (CELEXA) 20 MG tablet Take 1 tablet (20 mg total) by mouth daily. 05/17/21   Julieanne Manson, MD  clopidogrel (PLAVIX) 75 MG tablet Take 1 tablet (75 mg total) by mouth daily. 11/19/22   Tonny Bollman, MD  Dulaglutide 3 MG/0.5ML SOPN Inject 3 mg into the skin once a week. Patient not taking: Reported on 07/18/2023 04/29/23   Julieanne Manson, MD  empagliflozin (JARDIANCE) 25 MG TABS tablet Take 1 tablet (25 mg total) by mouth daily before breakfast. 02/06/23   Julieanne Manson, MD  ezetimibe (ZETIA) 10 MG tablet Take 1 tablet (10 mg total) by mouth daily. 11/19/22   Tonny Bollman, MD  famotidine (PEPCID) 40 MG tablet Take 1 tablet (40 mg total) by mouth at bedtime. 01/21/23   Julieanne Manson, MD  gabapentin (NEURONTIN) 300 MG capsule Take 1 capsule (300 mg total) by mouth at bedtime. 04/29/23   Julieanne Manson, MD  glipiZIDE (GLUCOTROL) 10 MG tablet Take 1 tablet (10 mg total) by mouth 2 (two) times daily before a meal. 05/17/21   Julieanne Manson, MD  glucose blood (TRUE METRIX BLOOD GLUCOSE TEST) test strip Check blood glucose twice daily before meals 07/25/23   Julieanne Manson, MD  Lancets MISC Check blood glucose twice daily before  meals 02/11/18   Julieanne Manson, MD  metFORMIN (GLUCOPHAGE) 1000 MG tablet Take 1 tablet (1,000 mg total) by mouth 2 (two) times daily with a meal. 05/17/21   Julieanne Manson, MD  sacubitril-valsartan (ENTRESTO) 49-51 MG Take 1 tablet by mouth 2 (two) times daily. 11/19/22   Tonny Bollman, MD  spironolactone (ALDACTONE) 25 MG tablet Take 0.5 tablets (12.5 mg total) by mouth daily. 11/19/22   Tonny Bollman, MD  TRULICITY 1.5 MG/0.5ML Ivory Broad  05/02/23   [provider]    Current Facility-Administered Medications   Medication Dose Route Frequency Provider Last Rate Last Admin   0.9 %  sodium chloride infusion   Intravenous Continuous Jenel Lucks, MD        Allergies as of 07/05/2023   (No Known Allergies)    Family History  Problem Relation Age of Onset   Stroke Sister    CAD Neg Hx    Colon cancer Neg Hx    Colon polyps Neg Hx    Esophageal cancer Neg Hx    Prostate cancer Neg Hx    Rectal cancer Neg Hx    Stomach cancer Neg Hx     Social History   Socioeconomic History   Marital status: Single    Spouse name: Not on file   Number of children: Not on file   Years of education: Not on file   Highest education level: Not on file  Occupational History   Not on file  Tobacco Use   Smoking status: Former    Types: Cigars, Pipe    Quit date: 09/04/1987    Years since quitting: 35.9   Smokeless tobacco: Never  Vaping Use   Vaping status: Never Used  Substance and Sexual Activity   Alcohol use: Not Currently   Drug use: Never   Sexual activity: Not on file  Other Topics Concern   Not on file  Social History Narrative   Not on file   Social Determinants of Health   Financial Resource Strain: Not on file  Food Insecurity: No Food Insecurity (02/11/2018)   Hunger Vital Sign    Worried About Running Out of Food in the Last Year: Never true    Ran Out of Food in the Last Year: Never true  Transportation Needs: No Transportation Needs (02/11/2018)   PRAPARE - Administrator, Civil Service (Medical): No    Lack of Transportation (Non-Medical): No  Physical Activity: Not on file  Stress: No Stress Concern Present (02/11/2018)   [employer] of Occupational Health - Occupational Stress Questionnaire    Feeling of Stress : Not at all  Social Connections: Unknown (01/15/2022)   Received from [facility]   Social Network    Social Network: Not on file  Intimate Partner Violence: Unknown (12/07/2021)   Received from [facility], [facility]    HITS    Physically Hurt: Not on file    Insult or Talk Down To: Not on file    Threaten Physical Harm: Not on file    Scream or Curse: Not on file    Review of Systems:  All other review of systems negative except as mentioned in the HPI.  Physical Exam: Vital signs There were no vitals taken for this visit.  General:   Alert,  Well-developed, well-nourished, pleasant and cooperative in NAD Airway:  Mallampati 3 Lungs:  Clear throughout to auscultation.   Heart:  Regular rate and rhythm; no murmurs, clicks, rubs,  or gallops. Abdomen:  Soft, nontender and nondistended. Normal bowel sounds.   Neuro/Psych:  Normal mood and affect. A and O x 3   Allen Borneman E. Tomasa Rand, MD [facility]

## 2023-07-29 NOTE — Transfer of Care (Signed)
Immediate Anesthesia Transfer of Care Note  Patient: Allen Cox.  Procedure(s) Performed: COLONOSCOPY WITH PROPOFOL POLYPECTOMY  Patient Location: PACU  Anesthesia Type:MAC  Level of Consciousness: sedated  Airway & Oxygen Therapy: Patient Spontanous Breathing  Post-op Assessment: Report given to RN  Post vital signs: Reviewed and stable  Last Vitals:  Vitals Value Taken Time  BP 104/61 07/29/23 1010  Temp    Pulse 78 07/29/23 1012  Resp 17 07/29/23 1012  SpO2 100 % 07/29/23 1012  Vitals shown include unfiled device data.  Last Pain: There were no vitals filed for this visit.       Complications: No notable events documented.

## 2023-07-29 NOTE — Op Note (Signed)
[facility] Patient Name: Allen Cox Procedure Date: 07/29/2023 MRN: [id number] Attending MD: Dub Amis. Tomasa Rand , MD, [id number] Date of Birth: 1971-08-31 CSN: [id number] Age: 52 Admit Type: Outpatient Procedure:                Colonoscopy Indications:              Screening for colorectal malignant neoplasm, This                            is the patient's first colonoscopy Providers:                Lorin Picket E. Tomasa Rand, MD, Stephens Shire RN, RN,                            Rozetta Nunnery, Technician Referring MD:              Medicines:                Monitored Anesthesia Care Complications:            No immediate complications. Estimated Blood Loss:     Estimated blood loss was minimal. Procedure:                Pre-Anesthesia Assessment:                           - Prior to the procedure, a History and Physical                            was performed, and patient medications and                            allergies were reviewed. The patient's tolerance of                            previous anesthesia was also reviewed. The risks                            and benefits of the procedure and the sedation                            options and risks were discussed with the patient.                            All questions were answered, and informed consent                            was obtained. Prior Anticoagulants: The patient has                            taken Plavix (clopidogrel), last dose was 5 days                            prior to procedure. ASA Grade Assessment: III - A  patient with severe systemic disease. After                            reviewing the risks and benefits, the patient was                            deemed in satisfactory condition to undergo the                            procedure.                           After obtaining informed consent, the colonoscope                            was passed under  direct vision. Throughout the                            procedure, the patient's blood pressure, pulse, and                            oxygen saturations were monitored continuously. The                            CF-HQ190L ([id number]) Olympus colonoscope was                            introduced through the anus and advanced to the the                            terminal ileum, with identification of the                            appendiceal orifice and IC valve. The colonoscopy                            was performed without difficulty. The patient                            tolerated the procedure well. The quality of the                            bowel preparation was adequate. The terminal ileum,                            ileocecal valve, appendiceal orifice, and rectum                            were photographed. The bowel preparation used was                            SUPREP via split dose instruction. Scope In: 9:45:26 AM Scope Out: 10:04:26 AM Scope Withdrawal Time: 0 hours 12 minutes 14 seconds  Total Procedure Duration: 0 hours 19 minutes 0 seconds  Findings:      The digital rectal exam was normal. Pertinent negatives include normal       sphincter tone and no palpable rectal lesions.      A 6 mm polyp was found in the ascending colon. The polyp was sessile.       The polyp was removed with a cold snare. Resection and retrieval were       complete. Estimated blood loss was minimal.      The exam was otherwise normal throughout the examined colon.      The terminal ileum appeared normal.      Non-bleeding internal hemorrhoids were found during retroflexion. The       hemorrhoids were Grade I (internal hemorrhoids that do not prolapse).      No additional abnormalities were found on retroflexion.      Skin tags were found on perianal exam. Impression:               - One 6 mm polyp in the ascending colon, removed                            with a cold snare. Resected and  retrieved.                           - The examined portion of the ileum was normal.                           - Non-bleeding internal hemorrhoids.                           - Perianal skin tags found on perianal exam. Moderate Sedation:      Not Applicable - Patient had care per Anesthesia. Recommendation:           - Patient has a contact number available for                            emergencies. The signs and symptoms of potential                            delayed complications were discussed with the                            patient. Return to normal activities tomorrow.                            Written discharge instructions were provided to the                            patient.                           - Resume previous diet.                           - Continue present medications.                           - Resume Plavix (clopidogrel)  at prior dose                            tomorrow.                           - Await pathology results.                           - Repeat colonoscopy (date not yet determined) for                            surveillance based on pathology results. Procedure Code(s):        --- Professional ---                           [phone], Colonoscopy, flexible; with removal of                            tumor(s), polyp(s), or other lesion(s) by snare                            technique Diagnosis Code(s):        --- Professional ---                           Z12.11, Encounter for screening for malignant                            neoplasm of colon                           D12.2, Benign neoplasm of ascending colon                           K64.0, First degree hemorrhoids CPT copyright 2022 American Medical Association. All rights reserved. The codes documented in this report are preliminary and upon coder review may  be revised to meet current compliance requirements. Hanifah Royse E. Tomasa Rand, MD 07/29/2023 10:14:25 AM This report has been signed  electronically. Number of Addenda: 0

## 2023-07-29 NOTE — Anesthesia Postprocedure Evaluation (Signed)
Anesthesia Post Note  Patient: Allen Cox.  Procedure(s) Performed: COLONOSCOPY WITH PROPOFOL POLYPECTOMY     Patient location during evaluation: PACU Anesthesia Type: MAC Level of consciousness: awake and alert Pain management: pain level controlled Vital Signs Assessment: post-procedure vital signs reviewed and stable Respiratory status: spontaneous breathing, nonlabored ventilation, respiratory function stable and patient connected to nasal cannula oxygen Cardiovascular status: stable and blood pressure returned to baseline Postop Assessment: no apparent nausea or vomiting Anesthetic complications: no  No notable events documented.  Last Vitals:  Vitals:   07/29/23 1030 07/29/23 1036  BP:  113/75  Pulse: 80 80  Resp: 14 15  Temp:    SpO2: 96% 90%    Last Pain:  Vitals:   07/29/23 1036  TempSrc:   PainSc: 0-No pain                 Kennieth Rad

## 2023-07-29 NOTE — Anesthesia Preprocedure Evaluation (Signed)
Anesthesia Evaluation  Patient identified by MRN, date of birth, ID band Patient awake    Reviewed: Allergy & Precautions, NPO status , Patient's Chart, lab work & pertinent test results  Airway Mallampati: III  TM Distance: >3 FB Neck ROM: Full    Dental  (+) Dental Advisory Given   Pulmonary sleep apnea , former smoker   breath sounds clear to auscultation       Cardiovascular hypertension, Pt. on medications and Pt. on home beta blockers + CAD, + CABG and +CHF   Rhythm:Regular Rate:Normal     Neuro/Psych negative neurological ROS     GI/Hepatic negative GI ROS, Neg liver ROS,,,  Endo/Other  diabetes, Type 2    Renal/GU negative Renal ROS     Musculoskeletal   Abdominal   Peds  Hematology  (+) Blood dyscrasia   Anesthesia Other Findings   Reproductive/Obstetrics                             Anesthesia Physical Anesthesia Plan  ASA: 3  Anesthesia Plan: MAC   Post-op Pain Management:    Induction:   PONV Risk Score and Plan: 1 and Propofol infusion and Ondansetron  Airway Management Planned: Natural Airway and Simple Face Mask  Additional Equipment:   Intra-op Plan:   Post-operative Plan:   Informed Consent: I have reviewed the patients History and Physical, chart, labs and discussed the procedure including the risks, benefits and alternatives for the proposed anesthesia with the patient or authorized representative who has indicated his/her understanding and acceptance.       Plan Discussed with:   Anesthesia Plan Comments:        Anesthesia Quick Evaluation

## 2023-07-30 ENCOUNTER — Encounter: Payer: Self-pay | Admitting: Gastroenterology

## 2023-07-30 LAB — SURGICAL PATHOLOGY

## 2023-07-31 ENCOUNTER — Encounter (HOSPITAL_COMMUNITY): Payer: Self-pay | Admitting: Gastroenterology

## 2023-07-31 ENCOUNTER — Other Ambulatory Visit: Payer: Medicare Other

## 2023-08-07 ENCOUNTER — Other Ambulatory Visit: Payer: Medicare Other

## 2023-08-14 ENCOUNTER — Encounter: Payer: Self-pay | Admitting: Internal Medicine

## 2023-08-23 DIAGNOSIS — M5412 Radiculopathy, cervical region: Secondary | ICD-10-CM | POA: Insufficient documentation

## 2023-08-23 DIAGNOSIS — M5020 Other cervical disc displacement, unspecified cervical region: Secondary | ICD-10-CM | POA: Insufficient documentation

## 2023-08-23 DIAGNOSIS — M533 Sacrococcygeal disorders, not elsewhere classified: Secondary | ICD-10-CM | POA: Insufficient documentation

## 2023-08-23 DIAGNOSIS — M545 Low back pain, unspecified: Secondary | ICD-10-CM | POA: Insufficient documentation

## 2023-08-23 DIAGNOSIS — M40202 Unspecified kyphosis, cervical region: Secondary | ICD-10-CM | POA: Insufficient documentation

## 2023-08-23 DIAGNOSIS — M542 Cervicalgia: Secondary | ICD-10-CM | POA: Insufficient documentation

## 2023-08-23 DIAGNOSIS — M7918 Myalgia, other site: Secondary | ICD-10-CM | POA: Insufficient documentation

## 2023-09-03 ENCOUNTER — Telehealth: Payer: Self-pay

## 2023-09-03 NOTE — Telephone Encounter (Signed)
Hi Tessa thanks for reaching out. I'm fine with the longer hold on ASA and plavix for him, as long as he remains asymptomatic. Thank you.

## 2023-09-03 NOTE — Telephone Encounter (Signed)
   Pre-operative Risk Assessment    Patient Name: Allen Cox.  DOB: 1971-02-21 MRN: [id number]   Date of last office visit: 05/24/23 Date of next office visit: 11/11/23   Request for Surgical Clearance    Procedure:   Anterior cervical decompression/fusion  Date of Surgery:  Clearance TBD                                Surgeon:  Abram Gartner, MD Surgeon's Group or Practice Name:  [facility]  Phone number:  [phone] Fax number:  [phone]   Type of Clearance Requested:   - Medical  - Pharmacy:  Hold Aspirin  and Clopidogrel  (Plavix ) x 7-10 days   Type of Anesthesia:  General    Additional requests/questions:    [name]   09/03/2023, 11:28 AM

## 2023-09-05 NOTE — Telephone Encounter (Signed)
 Left a message to call back to schedule a tele pre op appt.

## 2023-09-05 NOTE — Telephone Encounter (Signed)
 Primary Cardiologist:Michael Wonda, MD   Preoperative team, please contact this patient and set up a phone call appointment for further preoperative risk assessment. Please obtain consent and complete medication review. Thank you for your help.   I confirm that guidance regarding antiplatelet and oral anticoagulation therapy has been completed and, if necessary, noted below.  I also confirmed the patient resides in the state of [place] . As per [facility] Medical Board telemedicine laws, the patient must reside in the state in which the provider is licensed.    Rosaline EMERSON Bane, NP-C  09/05/2023, 10:57 AM [address] Office [phone] Fax [phone]

## 2023-09-06 NOTE — Telephone Encounter (Signed)
 2nd attempt to reach pt regarding surgical clearance and the need for an TELE appointment.  Left pt a detailed message to call back and get that scheduled.

## 2023-09-09 ENCOUNTER — Telehealth: Payer: Self-pay

## 2023-09-09 NOTE — Telephone Encounter (Signed)
  Patient Consent for Virtual Visit        Allen Cox. has provided verbal consent on 09/09/2023 for a virtual visit (video or telephone).   CONSENT FOR VIRTUAL VISIT FOR:  Allen Cox.  By participating in this virtual visit I agree to the following:  I hereby voluntarily request, consent and authorize [facility] and its employed or contracted physicians, physician assistants, nurse practitioners or other licensed health care professionals (the Practitioner), to provide me with telemedicine health care services (the "Services) as deemed necessary by the treating Practitioner. I acknowledge and consent to receive the Services by the Practitioner via telemedicine. I understand that the telemedicine visit will involve communicating with the Practitioner through live audiovisual communication technology and the disclosure of certain medical information by electronic transmission. I acknowledge that I have been given the opportunity to request an in-person assessment or other available alternative prior to the telemedicine visit and am voluntarily participating in the telemedicine visit.  I understand that I have the right to withhold or withdraw my consent to the use of telemedicine in the course of my care at any time, without affecting my right to future care or treatment, and that the Practitioner or I may terminate the telemedicine visit at any time. I understand that I have the right to inspect all information obtained and/or recorded in the course of the telemedicine visit and may receive copies of available information for a reasonable fee.  I understand that some of the potential risks of receiving the Services via telemedicine include:  Delay or interruption in medical evaluation due to technological equipment failure or disruption; Information transmitted may not be sufficient (e.g. poor resolution of images) to allow for appropriate medical decision making by the  Practitioner; and/or  In rare instances, security protocols could fail, causing a breach of personal health information.  Furthermore, I acknowledge that it is my responsibility to provide information about my medical history, conditions and care that is complete and accurate to the best of my ability. I acknowledge that Practitioner's advice, recommendations, and/or decision may be based on factors not within their control, such as incomplete or inaccurate data provided by me or distortions of diagnostic images or specimens that may result from electronic transmissions. I understand that the practice of medicine is not an exact science and that Practitioner makes no warranties or guarantees regarding treatment outcomes. I acknowledge that a copy of this consent can be made available to me via my patient portal [facility] MyChart), or I can request a printed copy by calling the office of [facility].    I understand that my insurance will be billed for this visit.   I have read or had this consent read to me. I understand the contents of this consent, which adequately explains the benefits and risks of the Services being provided via telemedicine.  I have been provided ample opportunity to ask questions regarding this consent and the Services and have had my questions answered to my satisfaction. I give my informed consent for the services to be provided through the use of telemedicine in my medical care

## 2023-09-09 NOTE — Telephone Encounter (Signed)
 Preop televisit scheduled, med rec and consent done

## 2023-09-23 ENCOUNTER — Ambulatory Visit: Payer: Medicare Other | Attending: Cardiovascular Disease | Admitting: Emergency Medicine

## 2023-09-23 DIAGNOSIS — Z01810 Encounter for preprocedural cardiovascular examination: Secondary | ICD-10-CM | POA: Diagnosis not present

## 2023-09-23 NOTE — Progress Notes (Signed)
Virtual Visit via Telephone Note   Because of Allen L Maura Jr.'s co-morbid illnesses, he is at least at moderate risk for complications without adequate follow up.  This format is felt to be most appropriate for this patient at this time.  The patient did not have access to video technology/had technical difficulties with video requiring transitioning to audio format only (telephone).  All issues noted in this document were discussed and addressed.  No physical exam could be performed with this format.  Please refer to the patient's chart for his consent to telehealth for [facility].  Evaluation Performed:  Preoperative cardiovascular risk assessment _____________   Date:  09/23/2023   Patient ID:  Allen Riches., DOB 1970/09/17, MRN [id number] Patient Location:  Home Provider location:   Office  Primary Care Provider:  Julieanne Manson, MD Primary Cardiologist:  Tonny Bollman, MD  Chief Complaint / Patient Profile   53 y.o. y/o male with a h/o chronic systolic HF, CAD s/p NSTEMI in 2019 with CABG x5 with postop VT requiring defib/CPR, ICM, HTN, HLD, OSA, T2DM who is pending anterior cervical decompression/fusion on date TBD with Dr. Jaynie Collins and presents today for telephonic preoperative cardiovascular risk assessment.  History of Present Illness    Allen L Karsin Friedland. is a 53 y.o. male who presents via audio/video conferencing for a telehealth visit today.  Pt was last seen in cardiology clinic on 05/24/2023 by Alden Server, NP.  At that time Allen L Kaydan Caffrey. was doing well.  The patient is now pending procedure as outlined above. Since his last visit, he denies chest pain, shortness of breath, lower extremity edema, fatigue, palpitations, melena, hematuria, hemoptysis, diaphoresis, weakness, presyncope, syncope, orthopnea, and PND.  Past Medical History    Past Medical History:  Diagnosis Date   Anxiety    CAD (coronary artery disease) of bypass graft    DM  (diabetes mellitus) (HCC)    HLD (hyperlipidemia)    Ischemic cardiomyopathy    Primary hypertension 10/08/2021   Tobacco use    Tubular adenoma of colon 07/05/2023   On screening colonoscopy.  No high grade dysplasia    VT (ventricular tachycardia) (HCC)    Past Surgical History:  Procedure Laterality Date   COLONOSCOPY WITH PROPOFOL N/A 07/29/2023   Procedure: COLONOSCOPY WITH PROPOFOL;  Surgeon: Jenel Lucks, MD;  Location: [facility];  Service: Gastroenterology;  Laterality: N/A;   CORONARY ARTERY BYPASS GRAFT N/A 01/08/2018   Procedure: CORONARY ARTERY BYPASS GRAFTING (CABG) x five, using Left Internal Mammary Artery and Right Leg Greater Saphenous Vein harvested endoscopically;  Surgeon: Loreli Slot, MD;  Location: [facility] OR;  Service: Open Heart Surgery;  Laterality: N/A;   LEFT HEART CATH AND CORONARY ANGIOGRAPHY N/A 01/03/2018   Procedure: LEFT HEART CATH AND CORONARY ANGIOGRAPHY;  Surgeon: Corky Crafts, MD;  Location: [facility] INVASIVE CV LAB;  Service: Cardiovascular;  Laterality: N/A;   POLYPECTOMY  07/29/2023   Procedure: POLYPECTOMY;  Surgeon: Jenel Lucks, MD;  Location: [facility];  Service: Gastroenterology;;   SHOULDER SURGERY Left    TEE WITHOUT CARDIOVERSION N/A 01/08/2018   Procedure: TRANSESOPHAGEAL ECHOCARDIOGRAM (TEE);  Surgeon: Loreli Slot, MD;  Location: [facility] OR;  Service: Open Heart Surgery;  Laterality: N/A;   WRIST ARTHROPLASTY Right     Allergies  No Known Allergies  Home Medications    Prior to Admission medications   Medication Sig Start Date End Date Taking? Authorizing Provider  acetaminophen (TYLENOL) 500 MG tablet  Take 1 tablet (500 mg total) by mouth every 6 (six) hours as needed. 04/17/23   Fayrene Helper, PA-C  aspirin EC 81 MG tablet Take 1 tablet (81 mg total) by mouth daily. 01/30/18   Bhagat, Sharrell Ku, PA  atorvastatin (LIPITOR) 80 MG tablet Take 1 tablet (80 mg total) by mouth at bedtime. 11/19/22   Tonny Bollman, MD  carvedilol (COREG) 12.5 MG tablet Take 1 tablet (12.5 mg total) by mouth 2 (two) times daily. 11/19/22 11/14/23  Tonny Bollman, MD  citalopram (CELEXA) 20 MG tablet Take 1 tablet (20 mg total) by mouth daily. 05/17/21   Julieanne Manson, MD  clopidogrel (PLAVIX) 75 MG tablet Take 1 tablet (75 mg total) by mouth daily. 11/19/22   Tonny Bollman, MD  Dulaglutide 3 MG/0.5ML SOPN Inject 3 mg into the skin once a week. 04/29/23   Julieanne Manson, MD  empagliflozin (JARDIANCE) 25 MG TABS tablet Take 1 tablet (25 mg total) by mouth daily before breakfast. 02/06/23   Julieanne Manson, MD  ezetimibe (ZETIA) 10 MG tablet Take 1 tablet (10 mg total) by mouth daily. 11/19/22   Tonny Bollman, MD  famotidine (PEPCID) 40 MG tablet Take 1 tablet (40 mg total) by mouth at bedtime. 01/21/23   Julieanne Manson, MD  gabapentin (NEURONTIN) 300 MG capsule Take 1 capsule (300 mg total) by mouth at bedtime. 04/29/23   Julieanne Manson, MD  glipiZIDE (GLUCOTROL) 10 MG tablet Take 1 tablet (10 mg total) by mouth 2 (two) times daily before a meal. 05/17/21   Julieanne Manson, MD  glucose blood (TRUE METRIX BLOOD GLUCOSE TEST) test strip Check blood glucose twice daily before meals 07/25/23   Julieanne Manson, MD  Lancets MISC Check blood glucose twice daily before meals 02/11/18   Julieanne Manson, MD  metFORMIN (GLUCOPHAGE) 1000 MG tablet Take 1 tablet (1,000 mg total) by mouth 2 (two) times daily with a meal. 05/17/21   Julieanne Manson, MD  sacubitril-valsartan (ENTRESTO) 49-51 MG Take 1 tablet by mouth 2 (two) times daily. 11/19/22   Tonny Bollman, MD  spironolactone (ALDACTONE) 25 MG tablet Take 0.5 tablets (12.5 mg total) by mouth daily. 11/19/22   Tonny Bollman, MD  TRULICITY 1.5 MG/0.5ML Ivory Broad  05/02/23   [provider]    Physical Exam    Vital Signs:  Allen L Jonna Clark. does not have vital signs available for review today.  Given telephonic nature of communication,  physical exam is limited. AAOx3. NAD. Normal affect.  Speech and respirations are unlabored.  Accessory Clinical Findings    None  Assessment & Plan    1.  Preoperative Cardiovascular Risk Assessment: According to the Revised Cardiac Risk Index (RCRI), his Perioperative Risk of Major Cardiac Event is (%): 6.6. His Functional Capacity in METs is: 6.79 according to the Duke Activity Status Index (DASI). Therefore, based on ACC/AHA guidelines, patient would be at acceptable risk for the planned procedure without further cardiovascular testing.  The patient was advised that if he develops new symptoms prior to surgery to contact our office to arrange for a follow-up visit, and he verbalized understanding.  Per Dr. Copper, he may hold Aspirin and Plavix for 7-10 days prior to procedure. Please resume Aspirin and Plavix as soon as possible postprocedure, at the discretion of the surgeon.    A copy of this note will be routed to requesting surgeon.  Time:   Today, I have spent 8 minutes with the patient with telehealth technology discussing medical history, symptoms, and management plan.  Denyce Robert, NP  09/23/2023, 8:02 AM

## 2023-09-24 ENCOUNTER — Ambulatory Visit (HOSPITAL_COMMUNITY): Payer: Medicare Other | Attending: Cardiology

## 2023-09-24 DIAGNOSIS — G4733 Obstructive sleep apnea (adult) (pediatric): Secondary | ICD-10-CM | POA: Diagnosis present

## 2023-09-24 DIAGNOSIS — I10 Essential (primary) hypertension: Secondary | ICD-10-CM | POA: Diagnosis present

## 2023-09-24 DIAGNOSIS — E782 Mixed hyperlipidemia: Secondary | ICD-10-CM | POA: Diagnosis present

## 2023-09-24 DIAGNOSIS — I5022 Chronic systolic (congestive) heart failure: Secondary | ICD-10-CM

## 2023-09-24 DIAGNOSIS — Z951 Presence of aortocoronary bypass graft: Secondary | ICD-10-CM

## 2023-09-24 DIAGNOSIS — E119 Type 2 diabetes mellitus without complications: Secondary | ICD-10-CM | POA: Diagnosis present

## 2023-09-24 LAB — ECHOCARDIOGRAM COMPLETE: S' Lateral: 4.4 cm

## 2023-11-11 ENCOUNTER — Encounter: Payer: Self-pay | Admitting: Cardiovascular Disease

## 2023-11-11 ENCOUNTER — Ambulatory Visit: Payer: Medicare Other | Attending: Cardiovascular Disease | Admitting: Cardiovascular Disease

## 2023-11-11 VITALS — BP 130/84 | HR 91 | Ht 72.0 in | Wt 247.2 lb

## 2023-11-11 DIAGNOSIS — E782 Mixed hyperlipidemia: Secondary | ICD-10-CM | POA: Insufficient documentation

## 2023-11-11 DIAGNOSIS — I5022 Chronic systolic (congestive) heart failure: Secondary | ICD-10-CM | POA: Diagnosis not present

## 2023-11-11 DIAGNOSIS — I2510 Atherosclerotic heart disease of native coronary artery without angina pectoris: Secondary | ICD-10-CM | POA: Diagnosis present

## 2023-11-11 DIAGNOSIS — I10 Essential (primary) hypertension: Secondary | ICD-10-CM | POA: Insufficient documentation

## 2023-11-11 MED ORDER — SACUBITRIL-VALSARTAN 97-103 MG PO TABS: 1.0000 | ORAL_TABLET | Freq: Two times a day (BID) | ORAL | 3 refills | Status: AC

## 2023-11-11 MED ORDER — SACUBITRIL-VALSARTAN 49-51 MG PO TABS: 1.0000 | ORAL_TABLET | Freq: Two times a day (BID) | ORAL | 3 refills | Status: DC

## 2023-11-11 NOTE — Patient Instructions (Signed)
 Medication Instructions:  STOP Clopidogrel/Plavix INCREASE Entresto to 97/103mg  twice daily *If you need a refill on your cardiac medications before your next appointment, please call your pharmacy*  Follow-Up: At [facility], you and your health needs are our priority.  As part of our continuing mission to provide you with exceptional heart care, we have created designated Provider Care Teams.  These Care Teams include your primary Cardiologist (physician) and Advanced Practice Providers (APPs -  Physician Assistants and Nurse Practitioners) who all work together to provide you with the care you need, when you need it.  We recommend signing up for the patient portal called "MyChart".  Sign up information is provided on this After Visit Summary.  MyChart is used to connect with patients for Virtual Visits (Telemedicine).  Patients are able to view lab/test results, encounter notes, upcoming appointments, etc.  Non-urgent messages can be sent to your provider as well.   To learn more about what you can do with MyChart, go to [email or web address].    Your next appointment:   6 month(s)  Provider:   Tereso Newcomer, PA      1st Floor: - Lobby - Registration  - Pharmacy  - Lab - Cafe  2nd Floor: - PV Lab - Diagnostic Testing (echo, CT, nuclear med)  3rd Floor: - Vacant  4th Floor: - TCTS (cardiothoracic surgery) - AFib Clinic - Structural Heart Clinic - Vascular Surgery  - Vascular Ultrasound  5th Floor: - [facility] (general and EP) - Clinical Pharmacy for coumadin, hypertension, lipid, weight-loss medications, and med management appointments    Valet parking services will be available as well.

## 2023-11-11 NOTE — Assessment & Plan Note (Signed)
 Treated with Zetia and atorvastatin.  Last lipids with an LDL cholesterol of 66.

## 2023-11-11 NOTE — Progress Notes (Signed)
 Cardiology Office Note:    Date:  11/11/2023   ID:  Allen Riches., DOB 08/19/1971, MRN [id number]  PCP:  Julieanne Manson, MD   [facility] Providers Cardiologist:  Tonny Bollman, MD     Referring MD: Julieanne Manson, MD   Chief Complaint  Patient presents with   Coronary Artery Disease    History of Present Illness:    Allen L Henrik Orihuela. is a 53 y.o. male with a hx of coronary artery disease, presenting for follow-up evaluation.  The patient underwent 5 vessel CABG in 2019 after presenting with non-STEMI.  He has chronic HFrEF with LVEF approximately 40%. The patient is here alone today. He underwent L4-L7 spinal fusion on 2/11 without cardiac complication.  The patient denies chest pain, chest pressure, or shortness of breath.  He has no leg swelling, orthopnea, or PND.  In reviewing her notes, I started him on Jardiance a year ago and increased his Entresto.  He had a follow-up echocardiogram in January 2025 that showed improvement in LVEF up to 40 to 45%, moderate RV dysfunction, no evidence of mitral regurgitation, and no aortic stenosis.  Current Medications: Current Meds  Medication Sig   acetaminophen (TYLENOL) 500 MG tablet Take 1 tablet (500 mg total) by mouth every 6 (six) hours as needed.   aspirin EC 81 MG tablet Take 1 tablet (81 mg total) by mouth daily.   atorvastatin (LIPITOR) 80 MG tablet Take 1 tablet (80 mg total) by mouth at bedtime.   carvedilol (COREG) 12.5 MG tablet Take 1 tablet (12.5 mg total) by mouth 2 (two) times daily.   citalopram (CELEXA) 20 MG tablet Take 1 tablet (20 mg total) by mouth daily.   Dulaglutide 3 MG/0.5ML SOPN Inject 3 mg into the skin once a week.   empagliflozin (JARDIANCE) 25 MG TABS tablet Take 1 tablet (25 mg total) by mouth daily before breakfast.   ezetimibe (ZETIA) 10 MG tablet Take 1 tablet (10 mg total) by mouth daily.   famotidine (PEPCID) 40 MG tablet Take 1 tablet (40 mg total) by mouth at bedtime.    gabapentin (NEURONTIN) 300 MG capsule Take 1 capsule (300 mg total) by mouth at bedtime.   glipiZIDE (GLUCOTROL) 10 MG tablet Take 1 tablet (10 mg total) by mouth 2 (two) times daily before a meal.   glucose blood (TRUE METRIX BLOOD GLUCOSE TEST) test strip Check blood glucose twice daily before meals   Lancets MISC Check blood glucose twice daily before meals   metFORMIN (GLUCOPHAGE) 1000 MG tablet Take 1 tablet (1,000 mg total) by mouth 2 (two) times daily with a meal.   sacubitril-valsartan (ENTRESTO) 97-103 MG Take 1 tablet by mouth 2 (two) times daily.   spironolactone (ALDACTONE) 25 MG tablet Take 0.5 tablets (12.5 mg total) by mouth daily.   [DISCONTINUED] clopidogrel (PLAVIX) 75 MG tablet Take 1 tablet (75 mg total) by mouth daily.   [DISCONTINUED] sacubitril-valsartan (ENTRESTO) 49-51 MG Take 1 tablet by mouth 2 (two) times daily.   [DISCONTINUED] TRULICITY 1.5 MG/0.5ML SOAJ      Allergies:   Patient has no known allergies.   ROS:   Please see the history of present illness.    All other systems reviewed and are negative.  EKGs/Labs/Other Studies Reviewed:    The following studies were reviewed today: Cardiac Studies & Procedures   ______________________________________________________________________________________________ CARDIAC CATHETERIZATION  CARDIAC CATHETERIZATION 01/03/2018  Narrative  Post Atrio lesion is 100% stenosed.  Dist RCA lesion is 25% stenosed.  Mid RCA lesion is 60% stenosed.  Prox RCA lesion is 25% stenosed.  Prox Cx to Mid Cx lesion is 75% stenosed.  Ost 1st Mrg to 1st Mrg lesion is 75% stenosed.  Ost 2nd Mrg to 2nd Mrg lesion is 40% stenosed.  Prox LAD to Mid LAD lesion is 99% stenosed.  2nd Diag lesion is 60% stenosed.  There is moderate left ventricular systolic dysfunction.  The left ventricular ejection fraction is 25-35% by visual estimate.  LV end diastolic pressure is mildly elevated.  There is no aortic valve  stenosis.  Severe three vessel disease detected in the setting of NSTEMI.  Likely uncontrolled diabetic.  Will obtain cardiac surgery consult.  Findings Coronary Findings Diagnostic  Dominance: Right  Left Anterior Descending Prox LAD to Mid LAD lesion is 99% stenosed. The lesion is heavily thrombotic.  Second Diagonal Branch Vessel is small in size. 2nd Diag lesion is 60% stenosed.  Second Septal Branch Collaterals 2nd Sept filled by collaterals from RPDA.  Left Circumflex Prox Cx to Mid Cx lesion is 75% stenosed.  First Obtuse Marginal Branch Ost 1st Mrg to 1st Mrg lesion is 75% stenosed.  Second Obtuse Marginal Branch Ost 2nd Mrg to 2nd Mrg lesion is 40% stenosed.  Right Coronary Artery The vessel is ectatic. Prox RCA lesion is 25% stenosed. Mid RCA lesion is 60% stenosed. Dist RCA lesion is 25% stenosed.  Right Posterior Atrioventricular Artery Collaterals RPAV filled by collaterals from RPDA.  Post Atrio lesion is 100% stenosed.  Intervention  No interventions have been documented.     ECHOCARDIOGRAM  ECHOCARDIOGRAM COMPLETE 09/24/2023  Narrative ECHOCARDIOGRAM REPORT    Patient Name:   Allen Cox. Date of Exam: 09/24/2023 Medical Rec #:  [id number]           Height:       72.0 in Accession #:    [id number]          Weight:       230.0 lb Date of Birth:  02-23-1971           BSA:          2.261 m Patient Age:    52 years            BP:           113/75 mmHg Patient Gender: M                   HR:           83 bpm. Exam Location:  [address]  Procedure: 2D Echo, Cardiac Doppler, Color Doppler and 3D Echo  Indications:    Left heart failure with preserved LV function I50.22  History:        Patient has prior history of Echocardiogram examinations, most recent 12/03/2022. CAD, Prior CABG; Risk Factors:Dyslipidemia and Diabetes.  Sonographer:    Thurman Coyer RDCS Referring Phys: [phone] Devoria Albe DICK  IMPRESSIONS   1. Left  ventricular ejection fraction, by estimation, is 40 to 45%. The left ventricle has mildly decreased function. The left ventricle demonstrates regional wall motion abnormalities (see scoring diagram/findings for description). Left ventricular diastolic parameters are indeterminate. Suggestive of prior anterior infarct. No LV thrombus noted. 2. Right ventricular systolic function is moderately reduced. The right ventricular size is normal. 3. The mitral valve is normal in structure. No evidence of mitral valve regurgitation. No evidence of mitral stenosis. 4. The aortic valve is tricuspid. Aortic valve regurgitation is not visualized.  No aortic stenosis is present. 5. Aortic dilatation noted. There is mild dilatation of the aortic root, measuring 40 mm.  Comparison(s): Prior images reviewed side by side. Left ventricular function has improved from prior.  FINDINGS Left Ventricle: Left ventricular ejection fraction, by estimation, is 40 to 45%. The left ventricle has mildly decreased function. The left ventricle demonstrates regional wall motion abnormalities. 3D ejection fraction reviewed and evaluated as part of the interpretation. Alternate measurement of EF is felt to be most reflective of LV function. The left ventricular internal cavity size was normal in size. There is no left ventricular hypertrophy. Left ventricular diastolic parameters are indeterminate.   LV Wall Scoring: The apical lateral segment, apical septal segment, and apical inferior segment are akinetic. The entire anterior wall, anterior septum, mid inferoseptal segment, and basal inferoseptal segment are hypokinetic. The antero-lateral wall, inferior wall, and posterior wall are normal. Suggestive of prior anterior infarct. No LV thrombus noted.  Right Ventricle: The right ventricular size is normal. No increase in right ventricular wall thickness. Right ventricular systolic function is moderately reduced.  Left Atrium:  Left atrial size was normal in size.  Right Atrium: Right atrial size was normal in size.  Pericardium: There is no evidence of pericardial effusion.  Mitral Valve: The mitral valve is normal in structure. No evidence of mitral valve regurgitation. No evidence of mitral valve stenosis.  Tricuspid Valve: The tricuspid valve is normal in structure. Tricuspid valve regurgitation is not demonstrated. No evidence of tricuspid stenosis.  Aortic Valve: The aortic valve is tricuspid. Aortic valve regurgitation is not visualized. No aortic stenosis is present.  Pulmonic Valve: The pulmonic valve was normal in structure. Pulmonic valve regurgitation is trivial. No evidence of pulmonic stenosis.  Aorta: Aortic dilatation noted. There is mild dilatation of the aortic root, measuring 40 mm.  IAS/Shunts: The interatrial septum was not well visualized.   LEFT VENTRICLE PLAX 2D LVIDd:         5.30 cm   Diastology LVIDs:         4.40 cm   LV e' lateral: 10.60 cm/s LV PW:         1.00 cm LV IVS:        1.00 cm LVOT diam:     2.60 cm LV SV:         72 LV SV Index:   32        3D Volume EF: LVOT Area:     5.31 cm  3D EF:        47 % LV EDV:       142 ml LV ESV:       76 ml LV SV:        66 ml  RIGHT VENTRICLE RV Basal diam:  3.60 cm RV Mid diam:    2.50 cm  LEFT ATRIUM             Index        RIGHT ATRIUM           Index LA diam:        4.50 cm 1.99 cm/m   RA Area:     18.00 cm LA Vol (A2C):   44.4 ml 19.64 ml/m  RA Volume:   50.10 ml  22.16 ml/m LA Vol (A4C):   51.8 ml 22.91 ml/m LA Biplane Vol: 49.9 ml 22.07 ml/m AORTIC VALVE LVOT Vmax:   74.60 cm/s LVOT Vmean:  51.500 cm/s LVOT VTI:    0.135 m  AORTA Ao Root diam: 4.00 cm Ao Asc diam:  3.70 cm   SHUNTS Systemic VTI:  0.14 m Systemic Diam: 2.60 cm  Riley Lam MD Electronically signed by Riley Lam MD Signature Date/Time: 09/24/2023/2:26:55 PM    Final   TEE  ECHO TEE  01/08/2018  Interpretation Summary  Aortic valve: The valve is trileaflet. No stenosis. No regurgitation.  Mitral valve: Mild Regurgitation. Central jet.  Right ventricle: Normal cavity size, wall thickness and ejection fraction.  Tricuspid valve: Mild regurgitation. The tricuspid valve regurgitation jet is central.  Pericardium: Small circumferential pericardial effusion. Effusion is fluid.  Pulmonic Valve: Poorly visualized, no insufficiency noted.  Left ventricle: Increased thickening of the wall of the ventricle. Diffuse hypokinesis. Akinesis of the inferior wall. EF 35-40%.  Post Bypass:  Tricuspid, Pulmonic and Aortic valve unchanged. Mild to moderated MR with regurgitant flow directed anteriorly. LVEF 30% (CO 5.2) but improving with vasopressor support, likely stunned from VF episode after initial separation from CPB. Anterior and lateral wall segments display improved contractility. Aortic Balloon Pump in proper position with no AI present. RV function improving with milrinone and Epi infusion. No aortic dissection noted after aortic cannula removal.      CARDIAC MRI  MR CARDIAC MORPHOLOGY W WO CONTRAST 05/15/2018  Narrative CLINICAL DATA:  53 year old man post NSTEMI, three-vessel disease with impaired left ventricular function with ejection fraction of 25 to 35%, S/P CABG x 5 on 01/08/2018 and VF arrest. Assess LVEF 3 months post CABG.  EXAM: CARDIAC MRI  TECHNIQUE: The patient was scanned on a 1.5 Tesla GE magnet. A dedicated cardiac coil was used. Functional imaging was done using Fiesta sequences. 2,3, and 4 chamber views were done to assess for RWMA's. Modified Simpson's rule using a short axis stack was used to calculate an ejection fraction on a dedicated work [employer]. The patient received 12 cc of Gadovist. After 10 minutes inversion recovery sequences were used to assess for infiltration and scar tissue.  CONTRAST:  12 cc of  Gadovist  FINDINGS: 1. Mildly dilated left ventricle with normal wall thickness and severely decreased systolic function (LVEF = 34%). There is hypokinesis in the mid anteroseptal, anterior and apical septal, anterior walls and in the true apex.  There is late gadolinium enhancement in the following segments: Mid anterior and anteroseptal walls (25-50%), apical septal and anterior walls (50-75%) and true apex (75-100%).  LVEDD: 60 mm  LVESD: 50 mm  LVEDV: 221 ml  LVESV: 147 ml  SV: 74 ml  Myocardial mass: 162 g  2. Normal right ventricular size, thickness and systolic function (LVEF = 48%). There are no regional wall motion abnormalities.  3.  Normal left and right atrial size.  4. Normal size of the aortic root, ascending aorta and pulmonary artery.  5.  No significant valvular abnormalities.  6.  Normal pericardium.  No pericardial effusion.  IMPRESSION: 1. Mildly dilated left ventricle with normal wall thickness and severely decreased systolic function (LVEF = 34%). There is hypokinesis in the mid anteroseptal, anterior and apical septal, anterior walls and in the true apex.  There is late gadolinium enhancement in the following segments: Mid anterior and anteroseptal walls (25-50%), apical septal and anterior walls (50-75%) and true apex (75-100%).  2. Normal right ventricular size, thickness and systolic function (LVEF = 48%). There are no regional wall motion abnormalities.  3.  Normal left and right atrial size.  4. Normal size of the aortic root, ascending aorta and pulmonary artery.  No improvement of LVEF since the last echocardiogram on 02/13/2018, an ICD implantation is recommended.  Tobias Alexander   Electronically Signed By: Tobias Alexander On: 05/18/2018 22:29   ______________________________________________________________________________________________      EKG:        Recent Labs: 01/21/2023: ALT 38; BUN 7; Creatinine, Ser  0.77; Hemoglobin 15.2; Platelets 262; Potassium 4.3; Sodium 139; TSH 1.440  Recent Lipid Panel    Component Value Date/Time   CHOL 114 01/01/2023 0831   TRIG 69 01/01/2023 0831   HDL 33 (L) 01/01/2023 0831   CHOLHDL 5.5 (H) 11/19/2022 1546   CHOLHDL 5.6 01/04/2018 0433   VLDL 19 01/04/2018 0433   LDLCALC 66 01/01/2023 0831     Risk Assessment/Calculations:                Physical Exam:    VS:  BP 130/84   Pulse 91   Ht 6' (1.829 m)   Wt 247 lb 3.2 oz (112.1 kg)   SpO2 96%   BMI 33.53 kg/m     Wt Readings from Last 3 Encounters:  11/11/23 247 lb 3.2 oz (112.1 kg)  07/18/23 230 lb (104.3 kg)  06/10/23 238 lb (108 kg)     GEN:  Well nourished, well developed in no acute distress HEENT: Normal NECK: No JVD; No carotid bruits LYMPHATICS: No lymphadenopathy CARDIAC: RRR, no murmurs, rubs, gallops RESPIRATORY:  Clear to auscultation without rales, wheezing or rhonchi  ABDOMEN: Soft, non-tender, non-distended MUSCULOSKELETAL:  No edema; No deformity  SKIN: Warm and dry NEUROLOGIC:  Alert and oriented x 3 PSYCHIATRIC:  Normal affect   Assessment & Plan Chronic systolic CHF (congestive heart failure) (HCC) Clinically stable with LVEF 40 to 45%.  Appears to have blood pressure room to further titrate his GDMT.  Continue carvedilol, spironolactone, and Jardiance at current doses.  Increase Entresto to a maximal dose of 97/103 mg.  Follow-up 6 months with a metabolic panel and office visit. Essential hypertension Increase Entresto, otherwise continue current medical therapy. Coronary artery disease involving native coronary artery of native heart without angina pectoris Stable with no symptoms of angina.  Discontinue clopidogrel.  Stay on aspirin 81 mg daily long-term. Mixed hyperlipidemia Treated with Zetia and atorvastatin.  Last lipids with an LDL cholesterol of 66.       Medication Adjustments/Labs and Tests Ordered: Current medicines are reviewed at length with  the patient today.  Concerns regarding medicines are outlined above.  No orders of the defined types were placed in this encounter.  Meds ordered this encounter  Medications   DISCONTD: sacubitril-valsartan (ENTRESTO) 49-51 MG    Sig: Take 1 tablet by mouth 2 (two) times daily.    Dispense:  180 tablet    Refill:  3   sacubitril-valsartan (ENTRESTO) 97-103 MG    Sig: Take 1 tablet by mouth 2 (two) times daily.    Dispense:  180 tablet    Refill:  3    Dose INCREASE (please disregard previous rx from today for lower strength)    Patient Instructions  Medication Instructions:  STOP Clopidogrel/Plavix INCREASE Entresto to 97/103mg  twice daily *If you need a refill on your cardiac medications before your next appointment, please call your pharmacy*  Follow-Up: At [facility], you and your health needs are our priority.  As part of our continuing mission to provide you with exceptional heart care, we have created designated Provider Care Teams.  These Care Teams include your primary Cardiologist (physician) and Advanced Practice  Providers (APPs -  Physician Assistants and Nurse Practitioners) who all work together to provide you with the care you need, when you need it.  We recommend signing up for the patient portal called "MyChart".  Sign up information is provided on this After Visit Summary.  MyChart is used to connect with patients for Virtual Visits (Telemedicine).  Patients are able to view lab/test results, encounter notes, upcoming appointments, etc.  Non-urgent messages can be sent to your provider as well.   To learn more about what you can do with MyChart, go to [email or web address].    Your next appointment:   6 month(s)  Provider:   Tereso Newcomer, PA      1st Floor: - Lobby - Registration  - Pharmacy  - Lab - Cafe  2nd Floor: - PV Lab - Diagnostic Testing (echo, CT, nuclear med)  3rd Floor: - Vacant  4th Floor: - TCTS (cardiothoracic surgery) -  [facility] - Structural Heart Clinic - Vascular Surgery  - Vascular Ultrasound  5th Floor: - HeartCare Cardiology (general and EP) - Clinical Pharmacy for coumadin, hypertension, lipid, weight-loss medications, and med management appointments    Valet parking services will be available as well.        Signed, Tonny Bollman, MD  11/11/2023 5:03 PM    [facility]

## 2023-11-21 ENCOUNTER — Other Ambulatory Visit: Payer: Self-pay | Admitting: Cardiovascular Disease

## 2023-11-25 ENCOUNTER — Other Ambulatory Visit: Payer: Self-pay | Admitting: Internal Medicine

## 2024-02-24 ENCOUNTER — Other Ambulatory Visit: Payer: Self-pay | Admitting: Internal Medicine

## 2024-05-21 ENCOUNTER — Ambulatory Visit (INDEPENDENT_AMBULATORY_CARE_PROVIDER_SITE_OTHER)

## 2024-05-21 ENCOUNTER — Telehealth: Payer: Self-pay

## 2024-05-21 VITALS — BP 111/74 | HR 83 | Temp 98.0°F | Resp 16 | Ht 73.0 in | Wt 233.2 lb

## 2024-05-21 DIAGNOSIS — Z79899 Other long term (current) drug therapy: Secondary | ICD-10-CM

## 2024-05-21 DIAGNOSIS — E1165 Type 2 diabetes mellitus with hyperglycemia: Secondary | ICD-10-CM | POA: Diagnosis not present

## 2024-05-21 DIAGNOSIS — Z7689 Persons encountering health services in other specified circumstances: Secondary | ICD-10-CM

## 2024-05-21 DIAGNOSIS — Z125 Encounter for screening for malignant neoplasm of prostate: Secondary | ICD-10-CM

## 2024-05-21 DIAGNOSIS — Z7984 Long term (current) use of oral hypoglycemic drugs: Secondary | ICD-10-CM | POA: Diagnosis not present

## 2024-05-21 DIAGNOSIS — Z87891 Personal history of nicotine dependence: Secondary | ICD-10-CM

## 2024-05-21 DIAGNOSIS — K219 Gastro-esophageal reflux disease without esophagitis: Secondary | ICD-10-CM

## 2024-05-21 LAB — POCT GLYCOSYLATED HEMOGLOBIN (HGB A1C): Hemoglobin A1C: 11.6 % — AB (ref 4.0–5.6)

## 2024-05-21 MED ORDER — PANTOPRAZOLE SODIUM 20 MG PO TBEC: 20.0000 mg | DELAYED_RELEASE_TABLET | Freq: Every day | ORAL | 2 refills | Status: AC

## 2024-05-21 MED ORDER — TRULICITY 3 MG/0.5ML ~~LOC~~ SOAJ: 3.0000 mg | SUBCUTANEOUS | 2 refills | Status: DC

## 2024-05-21 NOTE — Progress Notes (Unsigned)
 Patient ID: Allen Cox., male    DOB: February 19, 1971  MRN: [id number]  CC: Establish Care (Patient is here to established care with provider./~health hx address/~care gaps address/ )   Subjective: Allen Cox is a 53 y.o. male with past medical history of diabetes,CAD,  who presents to clinic to establish care.  Patient reports that he is trying to get back on track with eating a healthier diet and increasing his exercise.  Reports he is taking all medications as prescribed but has not taken Trulicity , because he did not pick it up from the pharmacy. Patient also reports that he deals with burping/regurgitation sensation in his throat, sometimes he does feel slight burning sensation after eating in his throat.  Has not taken any medications in the past for reflux.  Denies hemoptysis. Last physical over 2 years ago.   Patient Active Problem List   Diagnosis Date Noted   Tubular adenoma of colon 07/05/2023   Nightmares 06/10/2023   Balance problem 01/21/2023   Insomnia 01/21/2023   Obesity (BMI 30.0-34.9) 01/21/2023   OSA (obstructive sleep apnea) 01/21/2023   Primary hypertension 10/08/2021   Reactive depression 06/03/2018   Ischemic cardiomyopathy 06/03/2018   Diabetes mellitus type 2 in nonobese (HCC) 02/11/2018   Hyperlipidemia 02/11/2018   S/P CABG x 5 01/08/2018   NSTEMI (non-ST elevated myocardial infarction) (HCC) 01/03/2018     Current Outpatient Medications on File Prior to Visit  Medication Sig Dispense Refill   acetaminophen  (TYLENOL ) 500 MG tablet Take 1 tablet (500 mg total) by mouth every 6 (six) hours as needed. 30 tablet 0   aspirin  EC 81 MG tablet Take 1 tablet (81 mg total) by mouth daily. 90 tablet 3   atorvastatin  (LIPITOR ) 80 MG tablet Take 1 tablet (80 mg total) by mouth at bedtime. 90 tablet 3   carvedilol  (COREG ) 12.5 MG tablet Take 1 tablet (12.5 mg total) by mouth 2 (two) times daily. 180 tablet 3   citalopram  (CELEXA ) 20 MG tablet Take 1 tablet  (20 mg total) by mouth daily. 30 tablet 11   empagliflozin  (JARDIANCE ) 25 MG TABS tablet Take 1 tablet (25 mg total) by mouth daily before breakfast. 30 tablet 11   ezetimibe  (ZETIA ) 10 MG tablet Take 1 tablet (10 mg total) by mouth daily. 90 tablet 3   famotidine  (PEPCID ) 40 MG tablet Take 1 tablet (40 mg total) by mouth at bedtime. 30 tablet 6   gabapentin  (NEURONTIN ) 300 MG capsule Take 1 capsule (300 mg total) by mouth at bedtime. 90 capsule 3   glipiZIDE  (GLUCOTROL ) 10 MG tablet Take 1 tablet (10 mg total) by mouth 2 (two) times daily before a meal. 60 tablet 11   glucose blood (TRUE METRIX BLOOD GLUCOSE TEST) test strip Check blood glucose twice daily before meals 100 each 11   Lancets MISC Check blood glucose twice daily before meals 100 each 11   metFORMIN  (GLUCOPHAGE ) 1000 MG tablet Take 1 tablet (1,000 mg total) by mouth 2 (two) times daily with a meal. 60 tablet 11   sacubitril -valsartan  (ENTRESTO ) 97-103 MG Take 1 tablet by mouth 2 (two) times daily. 180 tablet 3   spironolactone  (ALDACTONE ) 25 MG tablet Take 1/2 (one-half) tablet by mouth once daily 45 tablet 3   clopidogrel  (PLAVIX ) 75 MG tablet 1 tablet Orally Once a day; Duration: 30 day(s)     No current facility-administered medications on file prior to visit.    No Known Allergies  Social History  Socioeconomic History   Marital status: Single    Spouse name: Not on file   Number of children: Not on file   Years of education: Not on file   Highest education level: Not on file  Occupational History   Not on file  Tobacco Use   Smoking status: Former    Types: Cigars, Pipe    Quit date: 09/04/1987    Years since quitting: 36.7   Smokeless tobacco: Never  Vaping Use   Vaping status: Never Used  Substance and Sexual Activity   Alcohol use: Not Currently   Drug use: Never   Sexual activity: Not Currently  Other Topics Concern   Not on file  Social History Narrative   Not on file   Social Drivers of Health    Financial Resource Strain: Low Risk  (05/21/2024)   Overall Financial Resource Strain (CARDIA)    Difficulty of Paying Living Expenses: Not very hard  Food Insecurity: No Food Insecurity (05/21/2024)   Hunger Vital Sign    Worried About Running Out of Food in the Last Year: Never true    Ran Out of Food in the Last Year: Never true  Transportation Needs: No Transportation Needs (05/21/2024)   PRAPARE - Administrator, Civil Service (Medical): No    Lack of Transportation (Non-Medical): No  Physical Activity: Sufficiently Active (05/21/2024)   Exercise Vital Sign    Days of Exercise per Week: 7 days    Minutes of Exercise per Session: 70 min  Stress: No Stress Concern Present (05/21/2024)   [employer] of Occupational Health - Occupational Stress Questionnaire    Feeling of Stress: Not at all  Social Connections: Moderately Integrated (05/21/2024)   Social Connection and Isolation Panel    Frequency of Communication with Friends and Family: Twice a week    Frequency of Social Gatherings with Friends and Family: Twice a week    Attends Religious Services: More than 4 times per year    Active Member of Clubs or Organizations: Patient unable to answer    Attends Banker Meetings: 1 to 4 times per year    Marital Status: Never married  Intimate Partner Violence: Not At Risk (05/21/2024)   Humiliation, Afraid, Rape, and Kick questionnaire    Fear of Current or Ex-Partner: No    Emotionally Abused: No    Physically Abused: No    Sexually Abused: No    Family History  Problem Relation Age of Onset   Stroke Sister    CAD Neg Hx    Colon cancer Neg Hx    Colon polyps Neg Hx    Esophageal cancer Neg Hx    Prostate cancer Neg Hx    Rectal cancer Neg Hx    Stomach cancer Neg Hx     Past Surgical History:  Procedure Laterality Date   COLONOSCOPY WITH PROPOFOL  N/A 07/29/2023   Procedure: COLONOSCOPY WITH PROPOFOL ;  Surgeon: Stacia Glendia BRAVO, MD;   Location: [facility];  Service: Gastroenterology;  Laterality: N/A;   CORONARY ARTERY BYPASS GRAFT N/A 01/08/2018   Procedure: CORONARY ARTERY BYPASS GRAFTING (CABG) x five, using Left Internal Mammary Artery and Right Leg Greater Saphenous Vein harvested endoscopically;  Surgeon: Kerrin Elspeth BROCKS, MD;  Location: [facility] OR;  Service: Open Heart Surgery;  Laterality: N/A;   LEFT HEART CATH AND CORONARY ANGIOGRAPHY N/A 01/03/2018   Procedure: LEFT HEART CATH AND CORONARY ANGIOGRAPHY;  Surgeon: Dann Candyce RAMAN, MD;  Location: [facility] INVASIVE CV  LAB;  Service: Cardiovascular;  Laterality: N/A;   POLYPECTOMY  07/29/2023   Procedure: POLYPECTOMY;  Surgeon: Stacia Glendia BRAVO, MD;  Location: [facility];  Service: Gastroenterology;;   SHOULDER SURGERY Left    TEE WITHOUT CARDIOVERSION N/A 01/08/2018   Procedure: TRANSESOPHAGEAL ECHOCARDIOGRAM (TEE);  Surgeon: Kerrin Elspeth BROCKS, MD;  Location: [facility] OR;  Service: Open Heart Surgery;  Laterality: N/A;   WRIST ARTHROPLASTY Right     ROS: Review of Systems Negative except as stated above  PHYSICAL EXAM: BP 111/74   Pulse 83   Temp 98 F (36.7 C) (Oral)   Resp 16   Ht 6' 1 (1.854 m)   Wt 233 lb 3.2 oz (105.8 kg)   SpO2 94%   BMI 30.77 kg/m   Physical Exam  General: well-appearing, no acute distress Skin: no jaundice, rashes, or lesions Cardiovascular: regular heart rate and rhythm, normal S1/S2, no murmurs, gallops, or rubs, peripheral pulses 2+ bilaterally Chest: no skeletal deformity, lungs clear to auscultation bilaterally, equal breath sounds bilaterally Abdomen: soft, non-distended, non-tender to palpation Musculoskeletal: normal gait Extremities: no peripheral edema   ASSESSMENT AND PLAN:  1. Encounter to establish care with new provider (Primary) Plan to follow up in a month for physical and labs  2. Type 2 diabetes mellitus with hyperglycemia, without long-term current use of insulin  (HCC) - POCT glycosylated  hemoglobin (Hb A1C) is 11.6! Today which is an increase from a year ago, was at 7.7.  - Pt advised that with an A1c above 10 I recommend restarting insulin  for better glucose control. Pt declined and would like to try lifestyle changes and restarting dulaglutide . Will recheck A1c in 3 months to discuss further management.  - Restart Dulaglutide  (TRULICITY ) 3 MG/0.5ML SOAJ; Inject 3 mg into the muscle once a week.  Dispense: 4 mL; Refill: 2 - Continue Metformin  1000mg  bid - Continue empagliflozin  25mg  daily  3. Gastroesophageal reflux disease without esophagitis - pantoprazole  (PROTONIX ) 20 MG tablet; Take 1 tablet (20 mg total) by mouth daily.  Dispense: 30 tablet; Refill: 2    Patient was given the opportunity to ask questions.  Patient verbalized understanding of the plan and was able to repeat key elements of the plan. Patient was given clear instructions to go to Emergency Department or return to medical center if symptoms don't improve, worsen, or new problems develop.The patient verbalized understanding.   Orders Placed This Encounter  Procedures   POCT glycosylated hemoglobin (Hb A1C)     Requested Prescriptions   Signed Prescriptions Disp Refills   Dulaglutide  (TRULICITY ) 3 MG/0.5ML SOAJ 4 mL 2    Sig: Inject 3 mg into the muscle once a week.   pantoprazole  (PROTONIX ) 20 MG tablet 30 tablet 2    Sig: Take 1 tablet (20 mg total) by mouth daily.    Return in about 1 month (around 06/20/2024) for physical, labs.  Sula Leavy Rode, PA-C

## 2024-05-21 NOTE — Telephone Encounter (Signed)
 Copied from CRM [id number]. Topic: General - Running Late >> May 21, 2024  2:46 PM Tiffini S wrote:  Patient/patient representative is calling because they are running late for an appointment. Is being delayed by a train. Should arrive at the office by 3:10pm

## 2024-05-21 NOTE — Telephone Encounter (Signed)
 FYI

## 2024-06-01 ENCOUNTER — Telehealth: Payer: Self-pay

## 2024-06-01 NOTE — Telephone Encounter (Signed)
 Pharmacy Patient Advocate Encounter  Received notification from [facility] that Prior Authorization for TRULICITY  has been APPROVED from 05/18/2024 to 09/02/2098   PA #/Case ID/Reference #: [id number]

## 2024-07-01 ENCOUNTER — Ambulatory Visit

## 2024-08-07 ENCOUNTER — Ambulatory Visit

## 2024-08-07 VITALS — BP 118/67 | HR 86 | Temp 98.1°F | Resp 18 | Ht 73.0 in | Wt 252.2 lb

## 2024-08-07 DIAGNOSIS — Z79899 Other long term (current) drug therapy: Secondary | ICD-10-CM | POA: Diagnosis not present

## 2024-08-07 DIAGNOSIS — Z0000 Encounter for general adult medical examination without abnormal findings: Secondary | ICD-10-CM | POA: Diagnosis not present

## 2024-08-07 DIAGNOSIS — Z7902 Long term (current) use of antithrombotics/antiplatelets: Secondary | ICD-10-CM | POA: Diagnosis not present

## 2024-08-07 DIAGNOSIS — Z7984 Long term (current) use of oral hypoglycemic drugs: Secondary | ICD-10-CM | POA: Diagnosis not present

## 2024-08-07 DIAGNOSIS — E1165 Type 2 diabetes mellitus with hyperglycemia: Secondary | ICD-10-CM

## 2024-08-07 LAB — POCT GLYCOSYLATED HEMOGLOBIN (HGB A1C): Hemoglobin A1C: 9.3 % — AB (ref 4.0–5.6)

## 2024-08-07 MED ORDER — TRULICITY 3 MG/0.5ML ~~LOC~~ SOAJ: 3.0000 mg | SUBCUTANEOUS | 2 refills | Status: AC

## 2024-08-07 NOTE — Progress Notes (Signed)
 Patient ID: Allen Mcshan., male    DOB: [birth year]  MRN: [id number]  CC: Annual Exam   Subjective: Allen Cox is a 53 y.o. male with past medical history of diabetes who presents to clinic for annual physical exam.  Patient reports that he is taking all his diabetes medication as directed.  No acute concerns at this time   Patient Active Problem List   Diagnosis Date Noted   Pain of both sacroiliac joints 08/23/2023   Neck pain 08/23/2023   Myofascial pain 08/23/2023   Low back pain, unspecified 08/23/2023   Kyphosis of cervical region 08/23/2023   Cervical myelopathy with cervical radiculopathy (HCC) 08/23/2023   Displacement of cervical intervertebral disc 08/23/2023   Cervical radiculopathy 08/23/2023   Tubular adenoma of colon 07/05/2023   Nightmares 06/10/2023   Balance problem 01/21/2023   Insomnia 01/21/2023   Obesity (BMI 30.0-34.9) 01/21/2023   OSA (obstructive sleep apnea) 01/21/2023   Primary hypertension 10/08/2021   Rotator cuff syndrome of left shoulder 08/09/2021   Chronic pain 08/09/2021   Reactive depression 06/03/2018   Ischemic cardiomyopathy 06/03/2018   Diabetes mellitus type 2 in nonobese (HCC) 02/11/2018   Hyperlipidemia 02/11/2018   S/P CABG x 5 01/08/2018   NSTEMI (non-ST elevated myocardial infarction) (HCC) 01/03/2018   Coronary artery disease with history of myocardial infarction without history of CABG 01/03/2018     Current Outpatient Medications on File Prior to Visit  Medication Sig Dispense Refill   acetaminophen  (TYLENOL ) 500 MG tablet Take 1 tablet (500 mg total) by mouth every 6 (six) hours as needed. 30 tablet 0   aspirin  EC 81 MG tablet Take 1 tablet (81 mg total) by mouth daily. 90 tablet 3   atorvastatin  (LIPITOR ) 80 MG tablet Take 1 tablet (80 mg total) by mouth at bedtime. 90 tablet 3   carvedilol  (COREG ) 12.5 MG tablet Take 1 tablet (12.5 mg total) by mouth 2 (two) times daily. 180 tablet 3   citalopram  (CELEXA ) 20  MG tablet Take 1 tablet (20 mg total) by mouth daily. 30 tablet 11   clopidogrel  (PLAVIX ) 75 MG tablet 1 tablet Orally Once a day; Duration: 30 day(s)     empagliflozin  (JARDIANCE ) 25 MG TABS tablet Take 1 tablet (25 mg total) by mouth daily before breakfast. 30 tablet 11   escitalopram (LEXAPRO) 10 MG tablet Take 10 mg by mouth daily.     ezetimibe  (ZETIA ) 10 MG tablet Take 1 tablet (10 mg total) by mouth daily. 90 tablet 3   famotidine  (PEPCID ) 40 MG tablet Take 1 tablet (40 mg total) by mouth at bedtime. 30 tablet 6   gabapentin  (NEURONTIN ) 300 MG capsule Take 1 capsule (300 mg total) by mouth at bedtime. 90 capsule 3   glipiZIDE  (GLUCOTROL ) 10 MG tablet Take 1 tablet (10 mg total) by mouth 2 (two) times daily before a meal. 60 tablet 11   glucose blood (TRUE METRIX BLOOD GLUCOSE TEST) test strip Check blood glucose twice daily before meals 100 each 11   Lancets MISC Check blood glucose twice daily before meals 100 each 11   metFORMIN  (GLUCOPHAGE ) 1000 MG tablet Take 1 tablet (1,000 mg total) by mouth 2 (two) times daily with a meal. 60 tablet 11   pantoprazole  (PROTONIX ) 20 MG tablet Take 1 tablet (20 mg total) by mouth daily. 30 tablet 2   sacubitril -valsartan  (ENTRESTO ) 97-103 MG Take 1 tablet by mouth 2 (two) times daily. 180 tablet 3   spironolactone  (ALDACTONE )  25 MG tablet Take 1/2 (one-half) tablet by mouth once daily 45 tablet 3   ezetimibe  (ZETIA ) 10 MG tablet 1 tablet Orally Once a day; Duration: 30 day(s)     No current facility-administered medications on file prior to visit.    No Known Allergies  Social History   Socioeconomic History   Marital status: Single    Spouse name: Not on file   Number of children: Not on file   Years of education: Not on file   Highest education level: Not on file  Occupational History   Not on file  Tobacco Use   Smoking status: Former    Types: Cigars, Pipe    Quit date: 09/04/1987    Years since quitting: 36.9   Smokeless tobacco:  Never  Vaping Use   Vaping status: Never Used  Substance and Sexual Activity   Alcohol use: Not Currently   Drug use: Never   Sexual activity: Not Currently  Other Topics Concern   Not on file  Social History Narrative   Not on file   Social Drivers of Health   Financial Resource Strain: Low Risk  (05/21/2024)   Overall Financial Resource Strain (CARDIA)    Difficulty of Paying Living Expenses: Not very hard  Food Insecurity: No Food Insecurity (05/21/2024)   Hunger Vital Sign    Worried About Running Out of Food in the Last Year: Never true    Ran Out of Food in the Last Year: Never true  Transportation Needs: No Transportation Needs (05/21/2024)   PRAPARE - Administrator, Civil Service (Medical): No    Lack of Transportation (Non-Medical): No  Physical Activity: Sufficiently Active (05/21/2024)   Exercise Vital Sign    Days of Exercise per Week: 7 days    Minutes of Exercise per Session: 70 min  Stress: No Stress Concern Present (05/21/2024)   [employer] of Occupational Health - Occupational Stress Questionnaire    Feeling of Stress: Not at all  Social Connections: Moderately Integrated (05/21/2024)   Social Connection and Isolation Panel    Frequency of Communication with Friends and Family: Twice a week    Frequency of Social Gatherings with Friends and Family: Twice a week    Attends Religious Services: More than 4 times per year    Active Member of Clubs or Organizations: Patient unable to answer    Attends Banker Meetings: 1 to 4 times per year    Marital Status: Never married  Intimate Partner Violence: Not At Risk (05/21/2024)   Humiliation, Afraid, Rape, and Kick questionnaire    Fear of Current or Ex-Partner: No    Emotionally Abused: No    Physically Abused: No    Sexually Abused: No    Family History  Problem Relation Age of Onset   Stroke Sister    CAD Neg Hx    Colon cancer Neg Hx    Colon polyps Neg Hx    Esophageal  cancer Neg Hx    Prostate cancer Neg Hx    Rectal cancer Neg Hx    Stomach cancer Neg Hx     Past Surgical History:  Procedure Laterality Date   COLONOSCOPY WITH PROPOFOL  N/A 07/29/2023   Procedure: COLONOSCOPY WITH PROPOFOL ;  Surgeon: Stacia Glendia BRAVO, MD;  Location: [facility];  Service: Gastroenterology;  Laterality: N/A;   CORONARY ARTERY BYPASS GRAFT N/A 01/08/2018   Procedure: CORONARY ARTERY BYPASS GRAFTING (CABG) x five, using Left Internal Mammary Artery and Right  Leg Greater Saphenous Vein harvested endoscopically;  Surgeon: Kerrin Elspeth BROCKS, MD;  Location: [facility] OR;  Service: Open Heart Surgery;  Laterality: N/A;   LEFT HEART CATH AND CORONARY ANGIOGRAPHY N/A 01/03/2018   Procedure: LEFT HEART CATH AND CORONARY ANGIOGRAPHY;  Surgeon: Dann Candyce RAMAN, MD;  Location: [facility] INVASIVE CV LAB;  Service: Cardiovascular;  Laterality: N/A;   POLYPECTOMY  07/29/2023   Procedure: POLYPECTOMY;  Surgeon: Stacia Glendia BRAVO, MD;  Location: [facility];  Service: Gastroenterology;;   SHOULDER SURGERY Left    TEE WITHOUT CARDIOVERSION N/A 01/08/2018   Procedure: TRANSESOPHAGEAL ECHOCARDIOGRAM (TEE);  Surgeon: Kerrin Elspeth BROCKS, MD;  Location: [facility] OR;  Service: Open Heart Surgery;  Laterality: N/A;   WRIST ARTHROPLASTY Right     ROS: Review of Systems Negative except as stated above  PHYSICAL EXAM: BP 118/67   Pulse 86   Temp 98.1 F (36.7 C)   Resp 18   Ht 6' 1 (1.854 m)   Wt 252 lb 3.2 oz (114.4 kg)   SpO2 95%   BMI 33.27 kg/m   Physical Exam  General: well-appearing, no acute distress Skin: no jaundice, rashes, or lesions Head: normocephalic, no lesions, no abnormal hair distribution or loss Eyes: anicteric sclera, pupils equally round and reactive to light and accommodation, extraocular movements intact Ears: no external lesions, tympanic membrane translucent  Nose: no septal deviation, turbinates clear Throat: trachea midline, no thyromegaly, moist mucus  membranes Cardiovascular: regular heart rate and rhythm, normal S1/S2, no murmurs, gallops, or rubs, peripheral pulses 2+ bilaterally Chest: no skeletal deformity, lungs clear to auscultation bilaterally, equal breath sounds bilaterally Abdomen: soft, non-distended, non-tender to palpation, no hepatomegaly, no splenomegaly, normoactive bowel sounds Musculoskeletal: strength of upper and lower extremities equal bilaterally, 5/5, normal gait Extremities: no peripheral edema, nails intact Neurologic: cranial nerves II-XII intact  ASSESSMENT AND PLAN:  1. Encounter for annual wellness visit (Primary) - Complete physical exam performed today, no abnormal findings.  2. Type 2 diabetes mellitus with hyperglycemia, without long-term current use of insulin  (HCC) - POCT glycosylated hemoglobin (Hb A1C) is 9.3 which is not at goal but improved from 2 months ago, last 11.6 on 9/18 - Urine Albumin /Creatinine with ratio (send out) [LAB689] - Refill provided for Dulaglutide  (TRULICITY ) 3 MG/0.5ML SOAJ; Inject 3 mg into the muscle once a week.  Dispense: 4 mL; Refill: 2 - Continue empagliflozin  25mg  tablet daily - Continue glipizide  10mg  tablet bid  - Cotninue Metformin  1000mg  tablet bid       Patient was given the opportunity to ask questions.  Patient verbalized understanding of the plan and was able to repeat key elements of the plan.    Orders Placed This Encounter  Procedures   Urine Albumin /Creatinine with ratio (send out) [LAB689]   POCT glycosylated hemoglobin (Hb A1C)     Requested Prescriptions   Signed Prescriptions Disp Refills   Dulaglutide  (TRULICITY ) 3 MG/0.5ML SOAJ 4 mL 2    Sig: Inject 3 mg into the muscle once a week.    Return in about 3 months (around 11/05/2024) for follow-up A1c.  Sula Leavy Rode, PA-C

## 2024-08-08 LAB — MICROALBUMIN / CREATININE URINE RATIO
Creatinine, Urine: 158.6 mg/dL
Microalb/Creat Ratio: 3 mg/g{creat} (ref 0–29)
Microalbumin, Urine: 4 ug/mL

## 2024-08-10 ENCOUNTER — Ambulatory Visit: Payer: Self-pay

## 2024-10-06 ENCOUNTER — Encounter: Payer: Self-pay | Admitting: Physician Assistant

## 2024-10-06 ENCOUNTER — Ambulatory Visit: Payer: Medicare (Managed Care) | Admitting: Physician Assistant

## 2024-10-06 ENCOUNTER — Telehealth: Payer: Self-pay

## 2024-10-06 VITALS — BP 120/60 | HR 99 | Ht 73.0 in | Wt 256.1 lb

## 2024-10-06 DIAGNOSIS — E7800 Pure hypercholesterolemia, unspecified: Secondary | ICD-10-CM

## 2024-10-06 DIAGNOSIS — R0602 Shortness of breath: Secondary | ICD-10-CM | POA: Diagnosis not present

## 2024-10-06 DIAGNOSIS — I2510 Atherosclerotic heart disease of native coronary artery without angina pectoris: Secondary | ICD-10-CM | POA: Diagnosis not present

## 2024-10-06 DIAGNOSIS — I5020 Unspecified systolic (congestive) heart failure: Secondary | ICD-10-CM | POA: Diagnosis not present

## 2024-10-06 DIAGNOSIS — I10 Essential (primary) hypertension: Secondary | ICD-10-CM

## 2024-10-06 NOTE — Assessment & Plan Note (Signed)
 Continue Lipitor 80 mg daily   Continue Zetia  10 mg daily

## 2024-10-06 NOTE — Patient Instructions (Signed)
 Medication Instructions:  No medication changes were made during today's visit.  *If you need a refill on your cardiac medications before your next appointment, please call your pharmacy*  Lab Work: BMET, BNP If you have labs (blood work) drawn today and your tests are completely normal, you will receive your results only by: MyChart Message (if you have MyChart) OR A paper copy in the mail If you have any lab test that is abnormal or we need to change your treatment, we will call you to review the results.  Testing/Procedures: CARDIAC PET- Your physician has requested that you have a Cardiac Pet Stress Test.   This testing is completed at [facility] ([address] [place], [place] [address]) or [facility] ([address] [place], [place]). Please arrive 30 minutes prior to your scheduled time.  The schedulers will call you to get this scheduled. Please follow further testing instructions below.    Your physician has requested that you have an echocardiogram. Echocardiography is a painless test that uses sound waves to create images of your heart. It provides your doctor with information about the size and shape of your heart and how well your hearts chambers and valves are working. This procedure takes approximately one hour. There are no restrictions for this procedure.   Please do NOT wear cologne, perfume, aftershave, or lotions (deodorant is allowed).   Please arrive 15 minutes prior to your appointment time.   Follow-Up: At [facility], you and your health needs are our priority.  As part of our continuing mission to provide you with exceptional heart care, our providers are all part of one team.  This team includes your primary Cardiologist (physician) and Advanced Practice Providers or APPs (Physician Assistants and Nurse Practitioners) who all work together to provide you with the care you need, when you need  it.  Your next appointment:   6 month(s)  Provider:   Ozell Fell, MD or Glendia Ferrier    We recommend signing up for the patient portal called MyChart.  Sign up information is provided on this After Visit Summary.  MyChart is used to connect with patients for Virtual Visits (Telemedicine).  Patients are able to view lab/test results, encounter notes, upcoming appointments, etc.  Non-urgent messages can be sent to your provider as well.   To learn more about what you can do with MyChart, go to [email or web address].   Other Instructions

## 2024-10-07 ENCOUNTER — Ambulatory Visit: Payer: Self-pay | Admitting: Physician Assistant

## 2024-10-07 LAB — BASIC METABOLIC PANEL WITH GFR
BUN/Creatinine Ratio: 2 — ABNORMAL LOW (ref 9–20)
BUN: 2 mg/dL — ABNORMAL LOW (ref 6–24)
CO2: 22 mmol/L (ref 20–29)
Calcium: 9.5 mg/dL (ref 8.7–10.2)
Chloride: 101 mmol/L (ref 96–106)
Creatinine, Ser: 1.06 mg/dL (ref 0.76–1.27)
Glucose: 206 mg/dL — ABNORMAL HIGH (ref 70–99)
Potassium: 4.9 mmol/L (ref 3.5–5.2)
Sodium: 138 mmol/L (ref 134–144)
eGFR: 83 mL/min/{1.73_m2}

## 2024-10-07 LAB — PRO B NATRIURETIC PEPTIDE: NT-Pro BNP: 116 pg/mL (ref 0–121)

## 2024-10-07 NOTE — Telephone Encounter (Signed)
 Pls call patient to schedule echo. He is also requesting his PET Stress to be done in [facility].

## 2024-10-07 NOTE — Progress Notes (Signed)
 Called pt, lm for lab results

## 2024-11-23 ENCOUNTER — Ambulatory Visit
